# Patient Record
Sex: Female | Born: 1955 | Race: White | Hispanic: No | Marital: Married | Smoking: Never smoker
Health system: Southern US, Community
[De-identification: ages and names within clinical notes are randomized; demographics above are authoritative.]

## PROBLEM LIST (undated history)

## (undated) DIAGNOSIS — C569 Malignant neoplasm of unspecified ovary: Secondary | ICD-10-CM

## (undated) DIAGNOSIS — T7840XA Allergy, unspecified, initial encounter: Secondary | ICD-10-CM

## (undated) DIAGNOSIS — N390 Urinary tract infection, site not specified: Secondary | ICD-10-CM

## (undated) DIAGNOSIS — B019 Varicella without complication: Secondary | ICD-10-CM

## (undated) DIAGNOSIS — I10 Essential (primary) hypertension: Secondary | ICD-10-CM

## (undated) DIAGNOSIS — Z1371 Encounter for nonprocreative screening for genetic disease carrier status: Secondary | ICD-10-CM

## (undated) DIAGNOSIS — Z807 Family history of other malignant neoplasms of lymphoid, hematopoietic and related tissues: Secondary | ICD-10-CM

## (undated) HISTORY — PX: HERNIA REPAIR: SHX51

## (undated) HISTORY — DX: Essential (primary) hypertension: I10

## (undated) HISTORY — PX: ABDOMINAL HYSTERECTOMY: SHX81

## (undated) HISTORY — DX: Allergy, unspecified, initial encounter: T78.40XA

## (undated) HISTORY — DX: Malignant neoplasm of unspecified ovary: C56.9

## (undated) HISTORY — DX: Varicella without complication: B01.9

## (undated) HISTORY — PX: OTHER SURGICAL HISTORY: SHX169

## (undated) HISTORY — PX: COLONOSCOPY: SHX174

## (undated) HISTORY — DX: Urinary tract infection, site not specified: N39.0

## (undated) HISTORY — DX: Family history of other malignant neoplasms of lymphoid, hematopoietic and related tissues: Z80.7

---

## 1983-07-14 HISTORY — PX: WISDOM TOOTH EXTRACTION: SHX21

## 1989-07-13 HISTORY — PX: TONSILLECTOMY: SUR1361

## 1994-07-13 HISTORY — PX: DILATION AND CURETTAGE OF UTERUS: SHX78

## 2007-07-14 HISTORY — PX: KNEE ARTHROSCOPY: SUR90

## 2007-07-14 HISTORY — PX: APPENDECTOMY: SHX54

## 2011-09-11 DIAGNOSIS — C569 Malignant neoplasm of unspecified ovary: Secondary | ICD-10-CM

## 2011-09-11 HISTORY — PX: DEBULKING: SHX6277

## 2011-09-11 HISTORY — PX: TOTAL ABDOMINAL HYSTERECTOMY W/ BILATERAL SALPINGOOPHORECTOMY: SHX83

## 2011-09-11 HISTORY — DX: Malignant neoplasm of unspecified ovary: C56.9

## 2011-09-11 HISTORY — PX: ABDOMINAL HYSTERECTOMY: SHX81

## 2012-08-05 DIAGNOSIS — Z8249 Family history of ischemic heart disease and other diseases of the circulatory system: Secondary | ICD-10-CM | POA: Insufficient documentation

## 2012-08-31 ENCOUNTER — Ambulatory Visit: Payer: Self-pay | Admitting: Gynecologic Oncology

## 2012-09-21 ENCOUNTER — Encounter: Payer: Self-pay | Admitting: *Deleted

## 2012-09-22 ENCOUNTER — Encounter: Payer: Self-pay | Admitting: Gynecologic Oncology

## 2012-09-22 ENCOUNTER — Encounter: Payer: Self-pay | Admitting: *Deleted

## 2012-09-22 ENCOUNTER — Other Ambulatory Visit: Payer: Private Health Insurance - Indemnity | Admitting: Lab

## 2012-09-22 ENCOUNTER — Ambulatory Visit: Payer: Private Health Insurance - Indemnity | Attending: Gynecologic Oncology | Admitting: Gynecologic Oncology

## 2012-09-22 VITALS — BP 128/80 | HR 80 | Temp 98.2°F | Resp 20 | Ht 65.87 in | Wt 170.0 lb

## 2012-09-22 DIAGNOSIS — C569 Malignant neoplasm of unspecified ovary: Secondary | ICD-10-CM | POA: Insufficient documentation

## 2012-09-22 DIAGNOSIS — I10 Essential (primary) hypertension: Secondary | ICD-10-CM | POA: Insufficient documentation

## 2012-09-22 DIAGNOSIS — Z90710 Acquired absence of both cervix and uterus: Secondary | ICD-10-CM | POA: Insufficient documentation

## 2012-09-22 NOTE — Patient Instructions (Signed)
Return to see me in 3-4 months
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
## 2012-09-23 ENCOUNTER — Telehealth: Payer: Self-pay | Admitting: *Deleted

## 2012-09-23 NOTE — Telephone Encounter (Signed)
Pt informed of lab results, Ca 125 normal @ 6.0.

## 2012-10-12 ENCOUNTER — Other Ambulatory Visit: Payer: Self-pay | Admitting: *Deleted

## 2012-10-12 DIAGNOSIS — C569 Malignant neoplasm of unspecified ovary: Secondary | ICD-10-CM

## 2012-10-13 ENCOUNTER — Other Ambulatory Visit: Payer: Self-pay | Admitting: *Deleted

## 2012-10-13 DIAGNOSIS — C569 Malignant neoplasm of unspecified ovary: Secondary | ICD-10-CM

## 2012-10-18 ENCOUNTER — Telehealth: Payer: Self-pay | Admitting: Gynecologic Oncology

## 2012-10-20 ENCOUNTER — Encounter: Payer: Self-pay | Admitting: Obstetrics & Gynecology

## 2012-10-20 ENCOUNTER — Ambulatory Visit (INDEPENDENT_AMBULATORY_CARE_PROVIDER_SITE_OTHER): Payer: Private Health Insurance - Indemnity | Admitting: Obstetrics & Gynecology

## 2012-10-20 VITALS — BP 137/89 | HR 62 | Temp 97.8°F | Ht 66.5 in | Wt 176.0 lb

## 2012-10-20 DIAGNOSIS — Z01419 Encounter for gynecological examination (general) (routine) without abnormal findings: Secondary | ICD-10-CM

## 2012-10-20 DIAGNOSIS — N393 Stress incontinence (female) (male): Secondary | ICD-10-CM

## 2012-10-20 NOTE — Progress Notes (Signed)
Subjective:     Leah Perkins is a 57 y.o. female here for a routine exam.  Current complaints: no concerns at this time.  Personal health questionnaire reviewed: no.   Gynecologic History No LMP recorded. Patient has had a hysterectomy. Contraception: none Last Pap: 2013 Results were: normal Last mammogram: 06/2012. Results were: normal Bone scan: Oct. 2011      The following portions of the patient's history were reviewed and updated as appropriate: allergies, current medications, past family history, past medical history, past social history and past surgical history.  Review of Systems Genitourinary:positive for urinary incontinence    Objective:    General appearance: alert Breasts: normal appearance, no masses or tenderness Abdomen: soft, non-tender; bowel sounds normal; no masses,  no organomegaly Pelvic: external genitalia normal, no adnexal masses or tenderness, vagina normal without discharge    Assessment:    Healthy female exam.  Ovarian cancer in remission Stress incontinence--not socially embarassing   Plan:   Return prn

## 2012-10-20 NOTE — Patient Instructions (Signed)
Health Maintenance, Females A healthy lifestyle and preventative care can promote health and wellness.  Maintain regular health, dental, and eye exams.  Eat a healthy diet. Foods like vegetables, fruits, whole grains, low-fat dairy products, and lean protein foods contain the nutrients you need without too many calories. Decrease your intake of foods high in solid fats, added sugars, and salt. Get information about a proper diet from your caregiver, if necessary.  Regular physical exercise is one of the most important things you can do for your health. Most adults should get at least 150 minutes of moderate-intensity exercise (any activity that increases your heart rate and causes you to sweat) each week. In addition, most adults need muscle-strengthening exercises on 2 or more days a week.   Maintain a healthy weight. The body mass index (BMI) is a screening tool to identify possible weight problems. It provides an estimate of body fat based on height and weight. Your caregiver can help determine your BMI, and can help you achieve or maintain a healthy weight. For adults 20 years and older:  A BMI below 18.5 is considered underweight.  A BMI of 18.5 to 24.9 is normal.  A BMI of 25 to 29.9 is considered overweight.  A BMI of 30 and above is considered obese.  Maintain normal blood lipids and cholesterol by exercising and minimizing your intake of saturated fat. Eat a balanced diet with plenty of fruits and vegetables. Blood tests for lipids and cholesterol should begin at age 20 and be repeated every 5 years. If your lipid or cholesterol levels are high, you are over 50, or you are a high risk for heart disease, you may need your cholesterol levels checked more frequently.Ongoing high lipid and cholesterol levels should be treated with medicines if diet and exercise are not effective.  If you smoke, find out from your caregiver how to quit. If you do not use tobacco, do not start.  If you  are pregnant, do not drink alcohol. If you are breastfeeding, be very cautious about drinking alcohol. If you are not pregnant and choose to drink alcohol, do not exceed 1 drink per day. One drink is considered to be 12 ounces (355 mL) of beer, 5 ounces (148 mL) of wine, or 1.5 ounces (44 mL) of liquor.  Avoid use of street drugs. Do not share needles with anyone. Ask for help if you need support or instructions about stopping the use of drugs.  High blood pressure causes heart disease and increases the risk of stroke. Blood pressure should be checked at least every 1 to 2 years. Ongoing high blood pressure should be treated with medicines, if weight loss and exercise are not effective.  If you are 55 to 58 years old, ask your caregiver if you should take aspirin to prevent strokes.  Diabetes screening involves taking a blood sample to check your fasting blood sugar level. This should be done once every 3 years, after age 45, if you are within normal weight and without risk factors for diabetes. Testing should be considered at a younger age or be carried out more frequently if you are overweight and have at least 1 risk factor for diabetes.  Breast cancer screening is essential preventative care for women. You should practice "breast self-awareness." This means understanding the normal appearance and feel of your breasts and may include breast self-examination. Any changes detected, no matter how small, should be reported to a caregiver. Women in their 20s and 30s should have   a clinical breast exam (CBE) by a caregiver as part of a regular health exam every 1 to 3 years. After age 40, women should have a CBE every year. Starting at age 40, women should consider having a mammogram (breast X-ray) every year. Women who have a family history of breast cancer should talk to their caregiver about genetic screening. Women at a high risk of breast cancer should talk to their caregiver about having an MRI and a  mammogram every year.  The Pap test is a screening test for cervical cancer. Women should have a Pap test starting at age 21. Between ages 21 and 29, Pap tests should be repeated every 2 years. Beginning at age 30, you should have a Pap test every 3 years as long as the past 3 Pap tests have been normal. If you had a hysterectomy for a problem that was not cancer or a condition that could lead to cancer, then you no longer need Pap tests. If you are between ages 65 and 70, and you have had normal Pap tests going back 10 years, you no longer need Pap tests. If you have had past treatment for cervical cancer or a condition that could lead to cancer, you need Pap tests and screening for cancer for at least 20 years after your treatment. If Pap tests have been discontinued, risk factors (such as a new sexual partner) need to be reassessed to determine if screening should be resumed. Some women have medical problems that increase the chance of getting cervical cancer. In these cases, your caregiver may recommend more frequent screening and Pap tests.  The human papillomavirus (HPV) test is an additional test that may be used for cervical cancer screening. The HPV test looks for the virus that can cause the cell changes on the cervix. The cells collected during the Pap test can be tested for HPV. The HPV test could be used to screen women aged 30 years and older, and should be used in women of any age who have unclear Pap test results. After the age of 30, women should have HPV testing at the same frequency as a Pap test.  Colorectal cancer can be detected and often prevented. Most routine colorectal cancer screening begins at the age of 50 and continues through age 75. However, your caregiver may recommend screening at an earlier age if you have risk factors for colon cancer. On a yearly basis, your caregiver may provide home test kits to check for hidden blood in the stool. Use of a small camera at the end of a  tube, to directly examine the colon (sigmoidoscopy or colonoscopy), can detect the earliest forms of colorectal cancer. Talk to your caregiver about this at age 50, when routine screening begins. Direct examination of the colon should be repeated every 5 to 10 years through age 75, unless early forms of pre-cancerous polyps or small growths are found.  Hepatitis C blood testing is recommended for all people born from 1945 through 1965 and any individual with known risks for hepatitis C.  Practice safe sex. Use condoms and avoid high-risk sexual practices to reduce the spread of sexually transmitted infections (STIs). Sexually active women aged 25 and younger should be checked for Chlamydia, which is a common sexually transmitted infection. Older women with new or multiple partners should also be tested for Chlamydia. Testing for other STIs is recommended if you are sexually active and at increased risk.  Osteoporosis is a disease in which the   bones lose minerals and strength with aging. This can result in serious bone fractures. The risk of osteoporosis can be identified using a bone density scan. Women ages 12 and over and women at risk for fractures or osteoporosis should discuss screening with their caregivers. Ask your caregiver whether you should be taking a calcium supplement or vitamin D to reduce the rate of osteoporosis.  Menopause can be associated with physical symptoms and risks. Hormone replacement therapy is available to decrease symptoms and risks. You should talk to your caregiver about whether hormone replacement therapy is right for you.  Use sunscreen with a sun protection factor (SPF) of 30 or greater. Apply sunscreen liberally and repeatedly throughout the day. You should seek shade when your shadow is shorter than you. Protect yourself by wearing long sleeves, pants, a wide-brimmed hat, and sunglasses year round, whenever you are outdoors.  Notify your caregiver of new moles or  changes in moles, especially if there is a change in shape or color. Also notify your caregiver if a mole is larger than the size of a pencil eraser.  Stay current with your immunizations. Document Released: 01/12/2011 Document Revised: 09/21/2011 Document Reviewed: 01/12/2011 [facility] Patient Information 2013 [place], [place].  Urinary Incontinence Your doctor wants you to have this information about urinary incontinence. This is the inability to keep urine in your body until you decide to release it. CAUSES  Prostate gland enlargement is a common cause of urinary incontinence. But there are many different causes for losing urinary control. They include:  Medicines.  Infections.  Prostate problems.  Surgery.  Neurological diseases.  Emotional factors. DIAGNOSIS  Evaluating the cause of incontinence is important in choosing the best treatment. This may require:  An ultrasound exam.  Kidney and bladder X-rays.  Cystoscopy. This is an exam of the bladder using a narrow scope. TREATMENT  For incontinent patients, normal daily hygiene and using changing pads or adult diapers regularly will prevent offensive odors and skin damage from the moisture. Changing your medicines may help control incontinence. Your caregiver may prescribe some medicines to help you regain control. Avoid caffeine. It can over-stimulate the bladder. Use the bathroom regularly. Try about every 2 to 3 hours even if you do not feel the need. Take time to empty your bladder completely. After urinating, wait a minute. Then try to urinate again. External devices used to catch urine or an indwelling urine catheter (Foley catheter) may be needed as well. Some prostate gland problems require surgery to correct. Call your caregiver for more information. Document Released: 08/06/2004 Document Revised: 09/21/2011 Document Reviewed: 08/01/2008 [facility] Patient Information 2013 [place], [place].

## 2012-10-21 ENCOUNTER — Ambulatory Visit: Payer: Self-pay | Admitting: Obstetrics & Gynecology

## 2012-10-31 ENCOUNTER — Other Ambulatory Visit: Payer: Self-pay | Admitting: *Deleted

## 2012-10-31 ENCOUNTER — Ambulatory Visit: Payer: Private Health Insurance - Indemnity

## 2012-10-31 ENCOUNTER — Ambulatory Visit: Payer: Private Health Insurance - Indemnity | Admitting: Lab

## 2012-10-31 ENCOUNTER — Ambulatory Visit: Payer: Self-pay | Admitting: Obstetrics & Gynecology

## 2012-10-31 VITALS — BP 117/79 | HR 85 | Temp 98.0°F

## 2012-10-31 DIAGNOSIS — C569 Malignant neoplasm of unspecified ovary: Secondary | ICD-10-CM

## 2012-10-31 MED ORDER — SODIUM CHLORIDE 0.9 % IJ SOLN
10.0000 mL | INTRAMUSCULAR | Status: DC | PRN
  Administered 2012-10-31: 10 mL via INTRAVENOUS
  Filled 2012-10-31: qty 10

## 2012-10-31 MED ORDER — HEPARIN SOD (PORK) LOCK FLUSH 100 UNIT/ML IV SOLN
500.0000 [IU] | Freq: Once | INTRAVENOUS | Status: AC
  Administered 2012-10-31: 500 [IU] via INTRAVENOUS
  Filled 2012-10-31: qty 5

## 2012-11-11 ENCOUNTER — Telehealth: Payer: Self-pay | Admitting: *Deleted

## 2012-11-11 NOTE — Telephone Encounter (Signed)
Pt was unable to make June 2,2014 lab and flush appointment. Pt stated in a phone message that the week of May 19th would be  good to be rescheduled. A message was left detailing New flush and lab appointments. New appointment are May 23,2014 @ 9:15 and 9:30.

## 2012-11-11 NOTE — Telephone Encounter (Signed)
Pt called to verify new appts. Of  May 23,2014 @ 9:15 and 930 . Leah Perkins stated that May 23,2014 appointments will work for her

## 2012-12-02 ENCOUNTER — Ambulatory Visit: Payer: Private Health Insurance - Indemnity

## 2012-12-02 ENCOUNTER — Other Ambulatory Visit: Payer: Private Health Insurance - Indemnity | Admitting: Lab

## 2012-12-02 VITALS — BP 130/94 | HR 78 | Temp 97.0°F

## 2012-12-02 DIAGNOSIS — C569 Malignant neoplasm of unspecified ovary: Secondary | ICD-10-CM

## 2012-12-02 DIAGNOSIS — C801 Malignant (primary) neoplasm, unspecified: Secondary | ICD-10-CM

## 2012-12-02 LAB — CA 125: CA 125: 4.2 U/mL (ref 0.0–30.2)

## 2012-12-02 MED ORDER — SODIUM CHLORIDE 0.9 % IJ SOLN
10.0000 mL | INTRAMUSCULAR | Status: DC | PRN
  Administered 2012-12-02: 10 mL via INTRAVENOUS
  Filled 2012-12-02: qty 10

## 2012-12-02 MED ORDER — HEPARIN SOD (PORK) LOCK FLUSH 100 UNIT/ML IV SOLN
500.0000 [IU] | Freq: Once | INTRAVENOUS | Status: AC
  Administered 2012-12-02: 500 [IU] via INTRAVENOUS
  Filled 2012-12-02: qty 5

## 2012-12-02 NOTE — Progress Notes (Signed)
Ok to access and flush port today per Seward Grater, Consulting civil engineer.

## 2012-12-12 ENCOUNTER — Other Ambulatory Visit: Payer: Private Health Insurance - Indemnity | Admitting: Lab

## 2012-12-22 ENCOUNTER — Encounter: Payer: Self-pay | Admitting: Gynecologic Oncology

## 2012-12-22 ENCOUNTER — Ambulatory Visit: Payer: Private Health Insurance - Indemnity | Attending: Gynecologic Oncology | Admitting: Gynecologic Oncology

## 2012-12-22 VITALS — BP 102/68 | HR 68 | Temp 98.7°F | Resp 16 | Ht 65.0 in | Wt 177.0 lb

## 2012-12-22 DIAGNOSIS — M549 Dorsalgia, unspecified: Secondary | ICD-10-CM | POA: Insufficient documentation

## 2012-12-22 DIAGNOSIS — Z8543 Personal history of malignant neoplasm of ovary: Secondary | ICD-10-CM

## 2012-12-22 DIAGNOSIS — Z79899 Other long term (current) drug therapy: Secondary | ICD-10-CM | POA: Insufficient documentation

## 2012-12-22 DIAGNOSIS — C569 Malignant neoplasm of unspecified ovary: Secondary | ICD-10-CM | POA: Insufficient documentation

## 2012-12-22 DIAGNOSIS — I10 Essential (primary) hypertension: Secondary | ICD-10-CM | POA: Insufficient documentation

## 2012-12-22 DIAGNOSIS — Z9089 Acquired absence of other organs: Secondary | ICD-10-CM | POA: Insufficient documentation

## 2012-12-22 DIAGNOSIS — Z90710 Acquired absence of both cervix and uterus: Secondary | ICD-10-CM | POA: Insufficient documentation

## 2012-12-22 DIAGNOSIS — N393 Stress incontinence (female) (male): Secondary | ICD-10-CM | POA: Insufficient documentation

## 2012-12-22 DIAGNOSIS — Z9221 Personal history of antineoplastic chemotherapy: Secondary | ICD-10-CM | POA: Insufficient documentation
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
## 2013-01-02 ENCOUNTER — Ambulatory Visit: Payer: Private Health Insurance - Indemnity

## 2013-01-02 VITALS — BP 131/90 | HR 73 | Temp 97.5°F

## 2013-01-02 DIAGNOSIS — C569 Malignant neoplasm of unspecified ovary: Secondary | ICD-10-CM

## 2013-01-02 MED ORDER — SODIUM CHLORIDE 0.9 % IJ SOLN
10.0000 mL | INTRAMUSCULAR | Status: DC | PRN
  Administered 2013-01-02: 10 mL via INTRAVENOUS
  Filled 2013-01-02: qty 10

## 2013-01-02 MED ORDER — HEPARIN SOD (PORK) LOCK FLUSH 100 UNIT/ML IV SOLN
500.0000 [IU] | Freq: Once | INTRAVENOUS | Status: AC
  Administered 2013-01-02: 500 [IU] via INTRAVENOUS
  Filled 2013-01-02: qty 5

## 2013-01-02 NOTE — Patient Instructions (Addendum)
Call MD for problems 

## 2013-01-30 ENCOUNTER — Ambulatory Visit: Payer: Private Health Insurance - Indemnity

## 2013-01-30 VITALS — BP 130/90 | HR 63 | Temp 97.1°F | Resp 14

## 2013-01-30 DIAGNOSIS — C569 Malignant neoplasm of unspecified ovary: Secondary | ICD-10-CM

## 2013-01-30 MED ORDER — SODIUM CHLORIDE 0.9 % IJ SOLN
10.0000 mL | INTRAMUSCULAR | Status: DC | PRN
  Administered 2013-01-30: 10 mL via INTRAVENOUS
  Filled 2013-01-30: qty 10

## 2013-01-30 MED ORDER — HEPARIN SOD (PORK) LOCK FLUSH 100 UNIT/ML IV SOLN
500.0000 [IU] | Freq: Once | INTRAVENOUS | Status: AC
  Administered 2013-01-30: 500 [IU] via INTRAVENOUS
  Filled 2013-01-30: qty 5

## 2013-02-27 ENCOUNTER — Ambulatory Visit: Payer: Private Health Insurance - Indemnity

## 2013-02-27 VITALS — BP 152/98 | HR 74 | Temp 97.0°F

## 2013-02-27 DIAGNOSIS — C569 Malignant neoplasm of unspecified ovary: Secondary | ICD-10-CM

## 2013-02-27 MED ORDER — HEPARIN SOD (PORK) LOCK FLUSH 100 UNIT/ML IV SOLN
500.0000 [IU] | Freq: Once | INTRAVENOUS | Status: AC
  Administered 2013-02-27: 500 [IU] via INTRAVENOUS
  Filled 2013-02-27: qty 5

## 2013-02-27 MED ORDER — SODIUM CHLORIDE 0.9 % IJ SOLN
10.0000 mL | INTRAMUSCULAR | Status: DC | PRN
  Administered 2013-02-27: 10 mL via INTRAVENOUS
  Filled 2013-02-27: qty 10

## 2013-02-27 NOTE — Patient Instructions (Signed)
Implanted Port Instructions  An implanted port is a central line that has a round shape and is placed under the skin. It is used for long-term IV (intravenous) access for:  · Medicine.  · Fluids.  · Liquid nutrition, such as TPN (total parenteral nutrition).  · Blood samples.  Ports can be placed:  · In the chest area just below the collarbone (this is the most common place.)  · In the arms.  · In the belly (abdomen) area.  · In the legs.  PARTS OF THE PORT  A port has 2 main parts:  · The reservoir. The reservoir is round, disc-shaped, and will be a small, raised area under your skin.  · The reservoir is the part where a needle is inserted (accessed) to either give medicines or to draw blood.  · The catheter. The catheter is a long, slender tube that extends from the reservoir. The catheter is placed into a large vein.  · Medicine that is inserted into the reservoir goes into the catheter and then into the vein.  INSERTION OF THE PORT  · The port is surgically placed in either an operating room or in a procedural area (interventional radiology).  · Medicine may be given to help you relax during the procedure.  · The skin where the port will be inserted is numbed (local anesthetic).  · 1 or 2 small cuts (incisions) will be made in the skin to insert the port.  · The port can be used after it has been inserted.  INCISION SITE CARE  · The incision site may have small adhesive strips on it. This helps keep the incision site closed. Sometimes, no adhesive strips are placed. Instead of adhesive strips, a special kind of surgical glue is used to keep the incision closed.  · If adhesive strips were placed on the incision sites, do not take them off. They will fall off on their own.  · The incision site may be sore for 1 to 2 days. Pain medicine can help.  · Do not get the incision site wet. Bathe or shower as directed by your caregiver.  · The incision site should heal in 5 to 7 days. A small scar may form after the  incision has healed.  ACCESSING THE PORT  Special steps must be taken to access the port:  · Before the port is accessed, a numbing cream can be placed on the skin. This helps numb the skin over the port site.  · A sterile technique is used to access the port.  · The port is accessed with a needle. Only "non-coring" port needles should be used to access the port. Once the port is accessed, a blood return should be checked. This helps ensure the port is in the vein and is not clogged (clotted).  · If your caregiver believes your port should remain accessed, a clear (transparent) bandage will be placed over the needle site. The bandage and needle will need to be changed every week or as directed by your caregiver.  · Keep the bandage covering the needle clean and dry. Do not get it wet. Follow your caregiver's instructions on how to take a shower or bath when the port is accessed.  · If your port does not need to stay accessed, no bandage is needed over the port.  FLUSHING THE PORT  Flushing the port keeps it from getting clogged. How often the port is flushed depends on:  · If a   constant infusion is running. If a constant infusion is running, the port may not need to be flushed.  · If intermittent medicines are given.  · If the port is not being used.  For intermittent medicines:  · The port will need to be flushed:  · After medicines have been given.  · After blood has been drawn.  · As part of routine maintenance.  · A port is normally flushed with:  · Normal saline.  · Heparin.  · Follow your caregiver's advice on how often, how much, and the type of flush to use on your port.  IMPORTANT PORT INFORMATION  · Tell your caregiver if you are allergic to heparin.  · After your port is placed, you will get a manufacturer's information card. The card has information about your port. Keep this card with you at all times.  · There are many types of ports available. Know what kind of port you have.  · In case of an  emergency, it may be helpful to wear a medical alert bracelet. This can help alert health care workers that you have a port.  · The port can stay in for as long as your caregiver believes it is necessary.  · When it is time for the port to come out, surgery will be done to remove it. The surgery will be similar to how the port was put in.  · If you are in the hospital or clinic:  · Your port will be taken care of and flushed by a nurse.  · If you are at home:  · A home health care nurse may give medicines and take care of the port.  · You or a family member can get special training and directions for giving medicine and taking care of the port at home.  SEEK IMMEDIATE MEDICAL CARE IF:   · Your port does not flush or you are unable to get a blood return.  · New drainage or pus is coming from the incision.  · A bad smell is coming from the incision site.  · You develop swelling or increased redness at the incision site.  · You develop increased swelling or pain at the port site.  · You develop swelling or pain in the surrounding skin near the port.  · You have an oral temperature above 102° F (38.9° C), not controlled by medicine.  MAKE SURE YOU:   · Understand these instructions.  · Will watch your condition.  · Will get help right away if you are not doing well or get worse.  Document Released: 06/29/2005 Document Revised: 09/21/2011 Document Reviewed: 09/20/2008  [employer]® Patient Information ©2014 [employer].

## 2013-03-28 ENCOUNTER — Encounter: Payer: Self-pay | Admitting: Gynecologic Oncology

## 2013-03-28 ENCOUNTER — Ambulatory Visit: Payer: Private Health Insurance - Indemnity

## 2013-03-28 ENCOUNTER — Ambulatory Visit: Payer: Private Health Insurance - Indemnity | Admitting: Gynecologic Oncology

## 2013-03-28 ENCOUNTER — Other Ambulatory Visit: Payer: Private Health Insurance - Indemnity | Admitting: Lab

## 2013-03-28 VITALS — BP 138/90 | HR 80 | Temp 98.4°F | Resp 20 | Ht 65.0 in | Wt 182.9 lb

## 2013-03-28 DIAGNOSIS — C569 Malignant neoplasm of unspecified ovary: Secondary | ICD-10-CM

## 2013-03-28 MED ORDER — SODIUM CHLORIDE 0.9 % IJ SOLN
10.0000 mL | INTRAMUSCULAR | Status: DC | PRN
  Administered 2013-03-28: 10 mL via INTRAVENOUS
  Filled 2013-03-28: qty 10

## 2013-03-28 MED ORDER — HEPARIN SOD (PORK) LOCK FLUSH 100 UNIT/ML IV SOLN
500.0000 [IU] | Freq: Once | INTRAVENOUS | Status: AC
  Administered 2013-03-28: 500 [IU] via INTRAVENOUS
  Filled 2013-03-28: qty 5

## 2013-03-28 NOTE — Patient Instructions (Signed)
Return to clinic in 3 months

## 2013-03-28 NOTE — Progress Notes (Signed)
Consult Note: Gyn-Onc  Leah Perkins 57 y.o. female  CC:  Chief Complaint  Patient presents with  . 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 At that time she underwent optimal debulking and was treated with 6 cycles of dose dense paclitaxel and carboplatin. She completed her therapy in September 2013. CA 125 initially was 99 and prior to her move from [place] needed at 3. She had a CA 125 performed at the [facility] last month and it was13.6 which was higher than her baseline had been. She had also undergone genetic testing and is BRCA negative. Her gynecologic oncologist in [place] referred her to [place] for followup. He did discuss with her that the recurrence of a woman with stage II cancer would be approximately 30-40% and a discuss options regarding consolidation but opted for close surveillance. She did very well from her chemotherapy and had no significant issues had manageable neutropenia and no significant neuropathy in her performance status was 0. She also has a family history significant for hypertrophic cardiomyopathy she has been tested and does not have the gene. She comes in today to establish care for followup with [place].  She had a mammogram in December 13 that was negative. Should a colonoscopy in September 2009 that was negative. Her last bone density scan was in October 2011.  Interval History:  CA 125 may 23rd is 4.2. Today's values are pending.  Review of Systems:  Constitutional: She has gained some weight since we last saw her she is somewhat discouraged by this. She you have been walking about 3-4 miles a day but she did stop over the last 2 weeks due to other issues. Skin: No rash, sores, jaundice, itching, or dryness.  Cardiovascular: No chest pain, shortness of breath, or edema  Pulmonary: No cough or wheeze.  Gastro Intestinal:  No  nausea, vomiting, constipation, or diarrhea reported. No bright red blood per rectum or change in bowel movement. She's had a few episodes of which she describes as early satiety. It sounds like occasionally she feels a "stuck" in her throat. It is intermittent. It is been on for the last 4 weeks it has not gotten progressively worse. There's no nausea or vomiting associated with these episodes. Genitourinary: No frequency, urgency, or dysuria.  Denies vaginal bleeding and discharge.  Musculoskeletal: No myalgia, arthralgia, joint swelling or pain.  Neurologic: No weakness, numbness, or change in gait.  Psychology: No changes  Current Meds:  Outpatient Encounter Prescriptions as of 03/28/2013  Medication Sig Dispense Refill  . calcium carbonate (OS-CAL) 600 MG TABS Take 600 mg by mouth 2 (two) times daily with a meal. With vitamin d      . lisinopril (PRINIVIL,ZESTRIL) 20 MG tablet Take 20 mg by mouth daily.      . Multiple Vitamins-Minerals (MULTIVITAMIN WITH MINERALS) tablet Take 1 tablet by mouth daily.      . vitamin C (ASCORBIC ACID) 500 MG tablet Take 500 mg by mouth 2 (two) times daily.       No facility-administered encounter medications on file as of 03/28/2013.    Allergy:  Allergies  Allergen Reactions  . Sulfa Antibiotics     Social Hx:   History   Social History  . Marital Status: Married    Spouse Name: N/A    Number of  Children: N/A  . Years of Education: N/A   Occupational History  . Not on file.   Social History Main Topics  . Smoking status: Never Smoker   . Smokeless tobacco: Never Used  . Alcohol Use: Yes     Comment: Socially  . Drug Use: No  . Sexual Activity: Yes   Other Topics Concern  . Not on file   Social History Narrative  . No narrative on file    Past Surgical Hx:  Past Surgical History  Procedure Laterality Date  . Appendectomy    . Abdominal hysterectomy  3/13 [facility] optimal tumor debulking  . Arthoscopy Right     Knee    Past  Medical Hx:  Past Medical History  Diagnosis Date  . Hypertension   . Ovarian cancer 09/2011    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 9/13    Family Hx:  Family History  Problem Relation Age of Onset  . Hypertension Mother   . Hypertrophic cardiomyopathy Mother     pt negativ rofr gene 08/05/12  . Alzheimer's disease Father   . Cancer Brother   . Alzheimer's disease Paternal Grandmother   . Hypertrophic cardiomyopathy Son     Vitals:  Blood pressure 138/90, pulse 80, temperature 98.4 F (36.9 C), temperature source Oral, resp. rate 20, height 5\' 5"  (1.651 m), weight 182 lb 14.4 oz (82.963 kg).  Physical Exam: Well-nourished well-developed female in no acute distress.  Neck: Supple, no lymphadenopathy no thyromegaly.  Lungs: Clear to auscultation bilaterally.  Cardiac Astro: Regular rate rhythm.  Abdomen: Shows well-healed vertical midline incision. There is no evidence of an incisional hernia. Abdomen is soft, nontender, nondistended. There is no palpable mass or prostatomegaly.  Groins: There's no lymphadenopathy.  Extremities: There is no edema.  Pelvic: Normal external female genitalia. Bimanual examination reveals no masses or nodularity rectal confirms.  Assessment/Plan:  57 year old with stage IIc serous ovarian carcinoma diagnosed and treated in 2013. She was initially diagnosed in March of 2013 he completed her therapy in September of 2013 without significant sequela. CA 125 is informative for her as it was elevated at the time of her diagnosis. She had genetic testing and is negative for BRCA gene mutation. She also had genetic testing for hypertrophic cardiomyopathy and that was similarly negative. She return to see me in 3 months. We will followup in results of her CA 125 from today. Her port was flushed without issue today. She would like to have her Port-A-Cath removed and  we will schedule that for her. The procedure note from her port  placement in [place] is in the media aspect of her at the chart under procedures for March of 2013  [facility] MD 03/28/2013, 2:37 PM

## 2013-03-29 ENCOUNTER — Other Ambulatory Visit: Payer: Private Health Insurance - Indemnity | Admitting: Lab

## 2013-03-29 ENCOUNTER — Ambulatory Visit: Payer: Private Health Insurance - Indemnity | Admitting: Gynecologic Oncology

## 2013-03-29 ENCOUNTER — Telehealth: Payer: Self-pay | Admitting: Gynecologic Oncology

## 2013-03-29 ENCOUNTER — Other Ambulatory Visit: Payer: Self-pay | Admitting: Radiology

## 2013-03-29 NOTE — Telephone Encounter (Signed)
Patient informed of CA 125 results and that she would be contacted by [facility] to schedule port removal.  No concerns voiced.  Instructed to call for any needs.

## 2013-03-30 ENCOUNTER — Encounter (HOSPITAL_COMMUNITY): Payer: Self-pay | Admitting: Pharmacy Technician

## 2013-04-01 ENCOUNTER — Encounter: Payer: Self-pay | Admitting: Gynecologic Oncology

## 2013-04-03 ENCOUNTER — Other Ambulatory Visit: Payer: Self-pay | Admitting: Radiology

## 2013-04-03 ENCOUNTER — Encounter (HOSPITAL_COMMUNITY): Payer: Self-pay

## 2013-04-03 ENCOUNTER — Ambulatory Visit (HOSPITAL_COMMUNITY)
Admission: RE | Admit: 2013-04-03 | Discharge: 2013-04-03 | Disposition: A | Discharge status: Home / Self Care | Payer: Medicare HMO | Source: Ambulatory Visit | Attending: Gynecologic Oncology | Admitting: Gynecologic Oncology

## 2013-04-03 DIAGNOSIS — Z452 Encounter for adjustment and management of vascular access device: Secondary | ICD-10-CM | POA: Insufficient documentation

## 2013-04-03 DIAGNOSIS — C569 Malignant neoplasm of unspecified ovary: Secondary | ICD-10-CM

## 2013-04-03 LAB — CBC
HCT: 36.3 % (ref 36.0–46.0)
Hemoglobin: 12.5 g/dL (ref 12.0–15.0)
MCH: 29.8 pg (ref 26.0–34.0)
MCHC: 34.4 g/dL (ref 30.0–36.0)
RBC: 4.2 MIL/uL (ref 3.87–5.11)
WBC: 3.8 10*3/uL — ABNORMAL LOW (ref 4.0–10.5)

## 2013-04-03 LAB — PROTIME-INR
INR: 1.04 (ref 0.00–1.49)
Prothrombin Time: 13.4 seconds (ref 11.6–15.2)

## 2013-04-03 MED ORDER — SODIUM CHLORIDE 0.9 % IV SOLN: INTRAVENOUS | Status: DC

## 2013-04-03 MED ORDER — SODIUM CHLORIDE 0.9 % IV SOLN
INTRAVENOUS | Status: DC
  Administered 2013-04-03: 13:00:00 via INTRAVENOUS

## 2013-04-03 MED ORDER — CEFAZOLIN SODIUM-DEXTROSE 2-3 GM-% IV SOLR
INTRAVENOUS | Status: AC
  Administered 2013-04-03: 2 g via INTRAVENOUS
  Filled 2013-04-03: qty 50

## 2013-04-03 MED ORDER — LIDOCAINE HCL 1 % IJ SOLN
INTRAMUSCULAR | Status: AC
  Filled 2013-04-03: qty 20

## 2013-04-03 MED ORDER — CEFAZOLIN SODIUM-DEXTROSE 2-3 GM-% IV SOLR
2.0000 g | INTRAVENOUS | Status: AC
  Administered 2013-04-03: 2 g via INTRAVENOUS

## 2013-04-03 MED ORDER — CEFAZOLIN SODIUM-DEXTROSE 2-3 GM-% IV SOLR: 2.0000 g | INTRAVENOUS | Status: DC

## 2013-04-03 NOTE — Procedures (Signed)
RIJV PAC removal No comp 

## 2013-04-13 ENCOUNTER — Telehealth: Payer: Self-pay | Admitting: *Deleted

## 2013-04-13 NOTE — Telephone Encounter (Signed)
Pt called and cancel her flush for 04/24/13 due to her port was removed....td

## 2013-05-18 ENCOUNTER — Other Ambulatory Visit: Payer: Self-pay

## 2013-06-20 ENCOUNTER — Encounter: Payer: Self-pay | Admitting: Gynecologic Oncology

## 2013-06-20 ENCOUNTER — Ambulatory Visit: Payer: Private Health Insurance - Indemnity | Admitting: Lab

## 2013-06-20 ENCOUNTER — Ambulatory Visit: Payer: Medicare HMO | Attending: Gynecologic Oncology | Admitting: Gynecologic Oncology

## 2013-06-20 ENCOUNTER — Other Ambulatory Visit: Payer: Self-pay | Admitting: Gynecologic Oncology

## 2013-06-20 VITALS — BP 138/97 | HR 98 | Temp 98.0°F | Resp 20 | Ht 65.0 in | Wt 185.6 lb

## 2013-06-20 DIAGNOSIS — Z9221 Personal history of antineoplastic chemotherapy: Secondary | ICD-10-CM | POA: Insufficient documentation

## 2013-06-20 DIAGNOSIS — Z1231 Encounter for screening mammogram for malignant neoplasm of breast: Secondary | ICD-10-CM

## 2013-06-20 DIAGNOSIS — C569 Malignant neoplasm of unspecified ovary: Secondary | ICD-10-CM | POA: Insufficient documentation

## 2013-06-20 NOTE — Progress Notes (Signed)
Consult Note: Gyn-Onc  Karl Luke 57 y.o. female  CC:  Chief Complaint  Patient presents with  . 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 At that time she underwent optimal debulking and was treated with 6 cycles of dose dense paclitaxel and carboplatin. She completed her therapy in September 2013. CA 125 initially was 99 and prior to her move from [place] needed at 3. She had a CA 125 performed at the [facility] last month and it was13.6 which was higher than her baseline had been. She had also undergone genetic testing and is BRCA negative. Her gynecologic oncologist in [place] referred her to [place] for followup. He did discuss with her that the recurrence of a woman with stage II cancer would be approximately 30-40% and a discuss options regarding consolidation but opted for close surveillance. She did very well from her chemotherapy and had no significant issues had manageable neutropenia and no significant neuropathy in her performance status was 0. She also has a family history significant for hypertrophic cardiomyopathy she has been tested and does not have the gene. She comes in today to establish care for followup with [place].  She had a mammogram in December 13 that was negative. She has not had one since she moved from [place]. She would like to have that scheduled. She had a colonoscopy in September 2009 that was negative. Her last bone density scan was in October 2011. I last saw her in September 2014 at which time her CA-125 was 4.9.  Interval History:  She herself is been doing very well. Her husband has been ill which has caused her for the past few weeks to limit her walking. They were walking and he was complaining of what he described as left knee pain. Ultimately he developed some shortness of breath and went to see his pulmonologist who diagnosed him  with pulmonary embolism. He also had a lower extremity DVT and is currently on Xarelto. He is doing much better. She had her Port-A-Cath removed on September 15. She does has a little bit of MG type of pain in the area of the port. She longer feels that food is getting stuck in her throat. The early satiety she commented to me about in September has resolved.  Review of Systems:  Constitutional: She has gained some weight since we last saw her she is somewhat discouraged by this. She you have been walking about 3-4 miles a day but she did stop over the last 3 weeks as mentioned above Skin: No rash, sores, jaundice, itching, or dryness.  Cardiovascular: No chest pain, shortness of breath, or edema  Pulmonary: No cough or wheeze.  Gastro Intestinal:  No nausea, vomiting, constipation, or diarrhea reported. No bright red blood per rectum or change in bowel movement.  Genitourinary: No frequency, urgency, or dysuria.  Denies vaginal bleeding and discharge.  Musculoskeletal: No myalgia, arthralgia, joint swelling or pain.  Neurologic: No weakness, numbness, or change in gait.  Psychology: No changes  Current Meds:  Outpatient Encounter Prescriptions as of 06/20/2013  Medication Sig  . Calcium Carbonate-Vitamin D (CALCIUM 600 + D PO) Take 1 tablet by mouth 2 (two) times daily.  Marland Kitchen lisinopril (PRINIVIL,ZESTRIL) 20 MG tablet Take 20 mg by mouth every morning.   . Multiple Vitamins-Minerals (MULTIVITAMIN WITH MINERALS) tablet  Take 1 tablet by mouth daily.  . vitamin C (ASCORBIC ACID) 500 MG tablet Take 500 mg by mouth 2 (two) times daily.    Allergy:  Allergies  Allergen Reactions  . Sulfa Antibiotics     Social Hx:   History   Social History  . Marital Status: Married    Spouse Name: N/A    Number of Children: N/A  . Years of Education: N/A   Occupational History  . Not on file.   Social History Main Topics  . Smoking status: Never Smoker   . Smokeless tobacco: Never Used  . Alcohol  Use: Yes     Comment: Socially  . Drug Use: No  . Sexual Activity: Yes   Other Topics Concern  . Not on file   Social History Narrative  . No narrative on file    Past Surgical Hx:  Past Surgical History  Procedure Laterality Date  . Appendectomy    . Abdominal hysterectomy  3/13 [facility] optimal tumor debulking  . Arthoscopy Right     Knee    Past Medical Hx:  Past Medical History  Diagnosis Date  . Hypertension   . Ovarian cancer 09/2011    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 9/13    Family Hx:  Family History  Problem Relation Age of Onset  . Hypertension Mother   . Hypertrophic cardiomyopathy Mother     pt negativ rofr gene 08/05/12  . Alzheimer's disease Father   . Cancer Brother   . Alzheimer's disease Paternal Grandmother   . Hypertrophic cardiomyopathy Son     Vitals:  Blood pressure 138/97, pulse 98, temperature 98 F (36.7 C), temperature source Oral, resp. rate 20, height 5\' 5"  (1.651 m), weight 185 lb 9.6 oz (84.188 kg).  Physical Exam: Well-nourished well-developed female in no acute distress.  Neck: Supple, no lymphadenopathy no thyromegaly.  Lungs: Clear to auscultation bilaterally.  Cardiac Astro: Regular rate rhythm.  Abdomen: Shows well-healed vertical midline incision. There is no evidence of an incisional hernia. Abdomen is soft, nontender, nondistended. There is no palpable mass or hepatomsplenomegaly.  Groins: There's no lymphadenopathy.  Extremities: There is no edema.  Pelvic: Normal external female genitalia. Bimanual examination reveals no masses or nodularity rectal confirms.  Assessment/Plan:  57 year old with stage IIc serous ovarian carcinoma diagnosed and treated in 2013. She was initially diagnosed in March of 2013 and she completed her therapy in September of 2013 without significant sequela. CA 125 is informative for her as it was elevated at the time of her diagnosis. She had genetic testing and is  negative for BRCA gene mutation. She also had genetic testing for hypertrophic cardiomyopathy and that was similarly negative. She return to see me in 3 months.   We'll followup in results of her CA 125. We will notify her of the results.  I've ordered a mammogram for her. Kaoir Loree A., MD 06/20/2013, 1:47 PM

## 2013-06-20 NOTE — Patient Instructions (Addendum)
We'll notify you of the results of your CA 125. Please make sure to get her mammogram done. Please return to see me in 3 months.

## 2013-06-21 ENCOUNTER — Ambulatory Visit: Payer: Medicare HMO | Admitting: Gynecologic Oncology

## 2013-06-22 ENCOUNTER — Telehealth: Payer: Self-pay | Admitting: Gynecologic Oncology

## 2013-06-22 NOTE — Telephone Encounter (Signed)
Pt notified of CA 125 results.  No concerns voiced.  Instructed to call for any needs.

## 2013-07-11 ENCOUNTER — Ambulatory Visit (HOSPITAL_COMMUNITY): Payer: Medicare HMO

## 2013-07-25 ENCOUNTER — Ambulatory Visit (HOSPITAL_COMMUNITY)
Admission: RE | Admit: 2013-07-25 | Discharge: 2013-07-25 | Disposition: A | Discharge status: Home / Self Care | Payer: Medicare HMO | Source: Ambulatory Visit | Attending: Gynecologic Oncology | Admitting: Gynecologic Oncology

## 2013-07-25 DIAGNOSIS — Z1231 Encounter for screening mammogram for malignant neoplasm of breast: Secondary | ICD-10-CM | POA: Insufficient documentation

## 2013-09-27 ENCOUNTER — Ambulatory Visit: Payer: Private Health Insurance - Indemnity

## 2013-09-27 ENCOUNTER — Ambulatory Visit: Payer: Private Health Insurance - Indemnity | Attending: Gynecologic Oncology | Admitting: Gynecologic Oncology

## 2013-09-27 ENCOUNTER — Encounter: Payer: Self-pay | Admitting: Gynecologic Oncology

## 2013-09-27 VITALS — BP 145/95 | HR 73 | Temp 97.9°F | Resp 18 | Ht 65.0 in | Wt 184.4 lb

## 2013-09-27 DIAGNOSIS — C569 Malignant neoplasm of unspecified ovary: Secondary | ICD-10-CM | POA: Insufficient documentation

## 2013-09-27 DIAGNOSIS — Z1371 Encounter for nonprocreative screening for genetic disease carrier status: Secondary | ICD-10-CM | POA: Insufficient documentation

## 2013-09-27 DIAGNOSIS — I10 Essential (primary) hypertension: Secondary | ICD-10-CM | POA: Insufficient documentation

## 2013-09-27 NOTE — Patient Instructions (Signed)
Will notify you of the results of your CA 125. Return to clinic in 3 months

## 2013-09-27 NOTE — Progress Notes (Signed)
Consult Note: Gyn-Onc  Leah Perkins 58 y.o. female  CC:  Chief Complaint  Patient presents with  . 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 At that time she underwent optimal debulking and was treated with 6 cycles of dose dense paclitaxel and carboplatin. She completed her therapy in September 2013. CA 125 initially was 99 and prior to her move from [place] needed at 3. She had a CA 125 performed at the [facility] last month and it was13.6 which was higher than her baseline had been. She had also undergone genetic testing and is BRCA negative. Her gynecologic oncologist in [place] referred her to [place] for followup. He did discuss with her that the recurrence of a woman with stage II cancer would be approximately 30-40% and a discuss options regarding consolidation but opted for close surveillance. She did very well from her chemotherapy and had no significant issues had manageable neutropenia and no significant neuropathy in her performance status was 0. She also has a family history significant for hypertrophic cardiomyopathy she has been tested and does not have the gene. She comes in today for followup with [place].  She had a mammogram 07/25/13 that was unremarkable. She had a colonoscopy in September 2009 that was negative. Her last bone density scan was in October 2011. I last saw her in December 2014 at which time her CA-125 was 4.8.  Interval History:  She herself is been doing very well.  She had her Port-A-Cath removed on September 15. She is doing quite well his as is her husband. She's going to the gym about 5 days a week and goes for an hour to an hour and a half each time. She walks on the treadmill for 30-45 minutes and also is lifting weights. She alternates legs and upper body. She's been noticing a pain under her right arm since going to the gym as to off  about 2 weeks. She noticed it primarily in the shower. It really her to palpation and not with range of motion. She did have a mammogram that was normal in January. She's also receiving some tenderness in her Port-A-Cath that started about the same time as the tenderness under her right axilla.  Review of Systems:  Constitutional: She is feeling well and is a bit discouraged with her weight, her clothes are fitting differently. Skin: No rash, sores, jaundice, itching, or dryness.  Cardiovascular: No chest pain, shortness of breath, or edema  Pulmonary: No cough or wheeze.  Gastro Intestinal:  No nausea, vomiting, constipation, or diarrhea reported. No bright red blood per rectum or change in bowel movement.  Genitourinary: No frequency, urgency, or dysuria.  Denies vaginal bleeding and discharge.  Musculoskeletal: No myalgia, arthralgia, + pain under right arm Neurologic: No weakness, numbness, or change in gait.  Psychology: No changes  Current Meds:  Outpatient Encounter Prescriptions as of 09/27/2013  Medication Sig  . Calcium Carbonate-Vitamin D (CALCIUM 600 + D PO) Take 1 tablet by mouth 2 (two) times daily.  . cholecalciferol (VITAMIN D) 1000 UNITS tablet Take 1,000 Units by mouth daily.  Marland Kitchen lisinopril (PRINIVIL,ZESTRIL) 20 MG tablet Take 20 mg by mouth every morning.   . Multiple Vitamins-Minerals (MULTIVITAMIN WITH MINERALS) tablet Take 1 tablet by mouth daily.  . vitamin C (ASCORBIC ACID) 500 MG tablet Take 500  mg by mouth 2 (two) times daily.    Allergy:  Allergies  Allergen Reactions  . Sulfa Antibiotics     "does not remember"    Social Hx:   History   Social History  . Marital Status: Married    Spouse Name: N/A    Number of Children: N/A  . Years of Education: N/A   Occupational History  . Not on file.   Social History Main Topics  . Smoking status: Never Smoker   . Smokeless tobacco: Never Used  . Alcohol Use: Yes     Comment: Socially  . Drug Use: No  .  Sexual Activity: Yes   Other Topics Concern  . Not on file   Social History Narrative  . No narrative on file    Past Surgical Hx:  Past Surgical History  Procedure Laterality Date  . Appendectomy    . Abdominal hysterectomy  3/13 [facility] optimal tumor debulking  . Arthoscopy Right     Knee    Past Medical Hx:  Past Medical History  Diagnosis Date  . Hypertension   . Ovarian cancer 09/2011    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 9/13    Family Hx:  Family History  Problem Relation Age of Onset  . Hypertension Mother   . Hypertrophic cardiomyopathy Mother     pt negativ rofr gene 08/05/12  . Alzheimer's disease Father   . Cancer Brother   . Alzheimer's disease Paternal Grandmother   . Hypertrophic cardiomyopathy Son     Vitals:  Blood pressure 145/95, pulse 73, temperature 97.9 F (36.6 C), resp. rate 18, height _0  (1.651 m), weight 184 lb 6.4 oz (83.643 kg).  Physical Exam: Well-nourished well-developed female in no acute distress.  Neck: Supple, no lymphadenopathy no thyromegaly.  Lungs: Clear to auscultation bilaterally. Tender over right pect. Major, no palpable masses  Cardiac: Regular rate rhythm.  Abdomen: Shows well-healed vertical midline incision. There is no evidence of an incisional hernia. Abdomen is soft, nontender, nondistended. There is no palpable mass or hepatomsplenomegaly.  Groins: There's no lymphadenopathy.  Extremities: There is no edema.  Pelvic: Normal external female genitalia. Bimanual examination reveals no masses or nodularity rectal confirms.  Assessment/Plan:  58 year old with stage IIc serous ovarian carcinoma diagnosed and treated in 2013. She was initially diagnosed in March of 2013 and she completed her therapy in September of 2013 without significant sequela. CA 125 is informative for her as it was elevated at the time of her diagnosis. She had genetic testing and is negative for BRCA gene mutation.  She also had genetic testing for hypertrophic cardiomyopathy and that was similarly negative. She return to see me in 3 months.   We'll followup in results of her CA 125. We will notify her of the results.  Jaylise Peek A., MD 09/27/2013, 9:37 AM

## 2013-09-28 ENCOUNTER — Telehealth: Payer: Self-pay | Admitting: Gynecologic Oncology

## 2013-09-28 LAB — CA 125: CA 125: 2.6 U/mL (ref 0.0–30.2)

## 2013-09-28 NOTE — Telephone Encounter (Signed)
Patient informed of CA 125 results.  No concerns voiced.  Advised to call for any concerns.

## 2013-11-01 ENCOUNTER — Ambulatory Visit: Payer: Private Health Insurance - Indemnity | Admitting: Obstetrics & Gynecology

## 2013-11-29 ENCOUNTER — Other Ambulatory Visit (HOSPITAL_BASED_OUTPATIENT_CLINIC_OR_DEPARTMENT_OTHER): Payer: Self-pay | Admitting: Family Medicine

## 2013-11-29 DIAGNOSIS — I700 Atherosclerosis of aorta: Secondary | ICD-10-CM

## 2013-12-01 ENCOUNTER — Ambulatory Visit (HOSPITAL_BASED_OUTPATIENT_CLINIC_OR_DEPARTMENT_OTHER)
Admission: RE | Admit: 2013-12-01 | Discharge: 2013-12-01 | Disposition: A | Discharge status: Home / Self Care | Payer: Private Health Insurance - Indemnity | Source: Ambulatory Visit | Attending: Family Medicine | Admitting: Family Medicine

## 2013-12-01 DIAGNOSIS — I700 Atherosclerosis of aorta: Secondary | ICD-10-CM | POA: Insufficient documentation

## 2013-12-28 ENCOUNTER — Encounter: Payer: Self-pay | Admitting: Gynecologic Oncology

## 2013-12-28 ENCOUNTER — Ambulatory Visit: Payer: Private Health Insurance - Indemnity | Attending: Gynecologic Oncology | Admitting: Gynecologic Oncology

## 2013-12-28 ENCOUNTER — Ambulatory Visit: Payer: Private Health Insurance - Indemnity

## 2013-12-28 VITALS — BP 128/86 | HR 81 | Temp 98.4°F | Resp 18 | Ht 65.0 in | Wt 181.8 lb

## 2013-12-28 DIAGNOSIS — Z8543 Personal history of malignant neoplasm of ovary: Secondary | ICD-10-CM | POA: Diagnosis not present

## 2013-12-28 DIAGNOSIS — I10 Essential (primary) hypertension: Secondary | ICD-10-CM | POA: Insufficient documentation

## 2013-12-28 DIAGNOSIS — Z9221 Personal history of antineoplastic chemotherapy: Secondary | ICD-10-CM

## 2013-12-28 DIAGNOSIS — I7090 Unspecified atherosclerosis: Secondary | ICD-10-CM

## 2013-12-28 DIAGNOSIS — C569 Malignant neoplasm of unspecified ovary: Secondary | ICD-10-CM | POA: Insufficient documentation

## 2013-12-28 DIAGNOSIS — Z79899 Other long term (current) drug therapy: Secondary | ICD-10-CM | POA: Insufficient documentation

## 2013-12-28 NOTE — Addendum Note (Signed)
Addended by: Lucile Crater on: 12/28/2013 01:33 PM   Modules accepted: Orders

## 2013-12-28 NOTE — Patient Instructions (Signed)
We will notify you of the results of your CA 125. Please return to see [place] in 3 months. At that time it'll be 2 years since completion of your treatment will change her visits to every 4 months for the subsequent year.

## 2013-12-28 NOTE — Progress Notes (Signed)
Consult Note: Gyn-Onc  Leah Perkins 58 y.o. female  CC:  Chief Complaint  Patient presents with  . 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 At that time she underwent optimal debulking and was treated with 6 cycles of dose dense paclitaxel and carboplatin. She completed her therapy in September 2013. CA 125 initially was 99 and prior to her move from [place] needed at 3. She had a CA 125 performed at the [facility] last month and it was13.6 which was higher than her baseline had been. She had also undergone genetic testing and is BRCA negative. Her gynecologic oncologist in [place] referred her to [place] for followup. He did discuss with her that the recurrence of a woman with stage II cancer would be approximately 30-40% and a discuss options regarding consolidation but opted for close surveillance. She did very well from her chemotherapy and had no significant issues had manageable neutropenia and no significant neuropathy in her performance status was 0. She also has a family history significant for hypertrophic cardiomyopathy she has been tested and does not have the gene. She comes in today for followup with [place].  She had a mammogram 07/25/13 that was unremarkable. She had a colonoscopy in September 2009 that was negative. Her last bone density scan was in October 2011. I last saw her in December 2014 at which time her CA-125 was 4.8.  Interval History:  She herself is been doing very well.  She had her Port-A-Cath removed. She's going to the gym about 5 days a week and goes for an hour to an hour and a half each time. She walks on the treadmill for 30-45 minutes and also is lifting weights. She alternates legs and upper body. She did have a mammogram that was normal in January. She continues to have some tenderness at her port site. Since I last saw her she's been feeling a lump  under her breast on that she primarily feels that she's sitting up when she lies down it is gone. She had ultrasound performed that revealed no abdominal hernia no other findings except for diffuse atherosclerotic disease. She experienced soreness in the area of the "lump" during the ultrasound and was sore for a few days after. Her mother had hypertension and cardiomegaly but did not have a myocardial infarction. Her weight is stable. She's exercising.  Review of Systems:  Constitutional: Denies fevers Skin: No rash, sores, jaundice, itching, or dryness.  Cardiovascular: No chest pain, shortness of breath, or edema  Pulmonary: No cough or wheeze.  Gastro Intestinal:  No nausea, vomiting, constipation, or diarrhea reported. No bright red blood per rectum or change in bowel movement.  Genitourinary: No frequency, urgency, or dysuria.  Denies vaginal bleeding and discharge.  Musculoskeletal: No myalgia, arthralgia Neurologic: No weakness, numbness, or change in gait.  Psychology: No changes  Current Meds:  Outpatient Encounter Prescriptions as of 12/28/2013  Medication Sig  . Calcium Carbonate-Vitamin D (CALCIUM 600 + D PO) Take 1 tablet by mouth 2 (two) times daily.  . cholecalciferol (VITAMIN D) 1000 UNITS tablet Take 1,000 Units by mouth daily.  Marland Kitchen lisinopril (PRINIVIL,ZESTRIL) 20 MG tablet Take 40 mg by mouth every morning.   . Multiple Vitamins-Minerals (MULTIVITAMIN WITH MINERALS) tablet Take 1 tablet by mouth daily.  . vitamin C (ASCORBIC ACID) 500 MG tablet Take 500 mg  by mouth 2 (two) times daily.    Allergy:  Allergies  Allergen Reactions  . Sulfa Antibiotics     "does not remember"    Social Hx:   History   Social History  . Marital Status: Married    Spouse Name: N/A    Number of Children: N/A  . Years of Education: N/A   Occupational History  . Not on file.   Social History Main Topics  . Smoking status: Never Smoker   . Smokeless tobacco: Never Used  . Alcohol  Use: Yes     Comment: Socially  . Drug Use: No  . Sexual Activity: Yes   Other Topics Concern  . Not on file   Social History Narrative  . No narrative on file    Past Surgical Hx:  Past Surgical History  Procedure Laterality Date  . Appendectomy    . Abdominal hysterectomy  3/13 [facility] optimal tumor debulking  . Arthoscopy Right     Knee    Past Medical Hx:  Past Medical History  Diagnosis Date  . Hypertension   . Ovarian cancer 09/2011    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 9/13    Family Hx:  Family History  Problem Relation Age of Onset  . Hypertension Mother   . Hypertrophic cardiomyopathy Mother     pt negativ rofr gene 08/05/12  . Alzheimer's disease Father   . Cancer Brother   . Alzheimer's disease Paternal Grandmother   . Hypertrophic cardiomyopathy Son     Vitals:  Blood pressure 128/86, pulse 81, temperature 98.4 F (36.9 C), temperature source Oral, resp. rate 18, height $RemoveBe'5\' 5"'[id number]$  (1.651 m), weight 181 lb 12.8 oz (82.464 kg).  Physical Exam: Well-nourished well-developed female in no acute distress.  Neck: Supple, no lymphadenopathy no thyromegaly.  Lungs: Clear to auscultation bilaterally. Tender over right pect. Major, no palpable masses  Cardiac: Regular rate rhythm.  Abdomen: Shows well-healed vertical midline incision. There is no evidence of an incisional hernia. Abdomen is soft, nontender, nondistended. There is no palpable mass or hepatomsplenomegaly. She was examined sitting and lying down. I cannot appreciate the mass that she is able to appreciate it is midway between her umbilicus in the epigastrium. There is no fluid wave.  Groins: There's no lymphadenopathy.  Extremities: There is no edema.  Pelvic: Normal external female genitalia. Bimanual examination reveals no masses or nodularity rectal confirms.  Assessment/Plan:  58 year old with stage IIc serous ovarian carcinoma diagnosed and treated in 2013. She was  initially diagnosed in March of 2013 and she completed her therapy in September of 2013 without significant sequela. CA 125 is informative for her as it was elevated at the time of her diagnosis. She had genetic testing and is negative for BRCA gene mutation. She also had genetic testing for hypertrophic cardiomyopathy and that was similarly negative. She return to see me in 3 months.   We'll followup in results of her CA 125. We will notify her of the results. At her next visit will be 2 years since she completed her treatment will change her appointment every 4 months. If her CA 125 is rising, or the abdominal discomfort mass it she perceives gets bigger we will schedule her for a CT scan. She will discuss the findings of atherosclerotic disease with her primary care physician. Leah Perkins,Leah A., MD 12/28/2013, 12:29 PM

## 2013-12-29 LAB — CA 125: CA 125: 4.8 U/mL (ref 0.0–30.2)

## 2014-01-02 ENCOUNTER — Telehealth: Payer: Self-pay | Admitting: *Deleted

## 2014-01-02 NOTE — Telephone Encounter (Signed)
Notified pt CA125 within normal limits. Pt verbalized understanding. No further concerns

## 2014-03-07 ENCOUNTER — Ambulatory Visit: Payer: Self-pay

## 2014-03-07 ENCOUNTER — Ambulatory Visit (INDEPENDENT_AMBULATORY_CARE_PROVIDER_SITE_OTHER): Payer: Private Health Insurance - Indemnity | Admitting: Family Medicine

## 2014-03-07 ENCOUNTER — Ambulatory Visit
Admission: RE | Admit: 2014-03-07 | Discharge: 2014-03-07 | Disposition: A | Discharge status: Home / Self Care | Payer: Private Health Insurance - Indemnity | Source: Ambulatory Visit | Attending: Family Medicine | Admitting: Family Medicine

## 2014-03-07 ENCOUNTER — Encounter: Payer: Self-pay | Admitting: Family Medicine

## 2014-03-07 ENCOUNTER — Other Ambulatory Visit: Payer: Self-pay | Admitting: Family Medicine

## 2014-03-07 VITALS — BP 130/82 | HR 88 | Ht 65.0 in | Wt 186.0 lb

## 2014-03-07 DIAGNOSIS — M25511 Pain in right shoulder: Secondary | ICD-10-CM

## 2014-03-07 DIAGNOSIS — M949 Disorder of cartilage, unspecified: Secondary | ICD-10-CM

## 2014-03-07 DIAGNOSIS — R52 Pain, unspecified: Secondary | ICD-10-CM

## 2014-03-07 DIAGNOSIS — S4360XA Sprain of unspecified sternoclavicular joint, initial encounter: Secondary | ICD-10-CM

## 2014-03-07 DIAGNOSIS — S29011A Strain of muscle and tendon of front wall of thorax, initial encounter: Secondary | ICD-10-CM

## 2014-03-07 DIAGNOSIS — M899 Disorder of bone, unspecified: Secondary | ICD-10-CM

## 2014-03-07 HISTORY — DX: Strain of muscle and tendon of front wall of thorax, initial encounter: S29.011A

## 2014-03-07 NOTE — Assessment & Plan Note (Addendum)
Complete patient has had a chronic sternoclavicular strain in with recent increase in activity she seem to aggravate it. Patient is not having any significant pain this has more of an audible popping. I believe that this is just the tendon rolling over that area. Patient does have a flattened l fatty tissue and does not appear to be a lymph node. This is overlying the bone and likely not a supraclavicular node. I do not feel any significant swelling and no hard masses appreciated. Patient does have a past medical history significant for ovarian cancer and we discussed possibly doing a workup which patient declined. Patient has not had any fevers or chills or any abnormal weight loss. Patient has been feeling like herself overall. Patient is to try home exercise program, topical anti-inflammatories, as well as an icing protocol. Patient will get x-rays. This will rule out any bony abnormality. Patient will come back again in 3 weeks for further evaluation and treatment.

## 2014-03-07 NOTE — Addendum Note (Signed)
Addended by: Lyndal Pulley on: 03/07/2014 03:32 PM   Modules accepted: Orders

## 2014-03-07 NOTE — Patient Instructions (Signed)
Good to see you [name] separation.  Exercises 3 times a week.  Chest press and flys but drop 50% of weight. Increase 10% a week.  Ice after activity.  [place] up to 2 times daily.  We will get xray somewhere.  Monday Kellie Moor at [facility].  [place] Come back in 3 weeks.
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
## 2014-03-28 ENCOUNTER — Ambulatory Visit: Payer: Private Health Insurance - Indemnity | Admitting: Family Medicine

## 2014-03-29 ENCOUNTER — Ambulatory Visit (INDEPENDENT_AMBULATORY_CARE_PROVIDER_SITE_OTHER): Payer: Private Health Insurance - Indemnity | Admitting: Family Medicine

## 2014-03-29 ENCOUNTER — Encounter: Payer: Self-pay | Admitting: Family Medicine

## 2014-03-29 VITALS — BP 132/82 | HR 82 | Ht 65.0 in | Wt 182.0 lb

## 2014-03-29 DIAGNOSIS — S29011D Strain of muscle and tendon of front wall of thorax, subsequent encounter: Secondary | ICD-10-CM

## 2014-03-29 DIAGNOSIS — Z5189 Encounter for other specified aftercare: Secondary | ICD-10-CM

## 2014-03-29 DIAGNOSIS — S4360XA Sprain of unspecified sternoclavicular joint, initial encounter: Secondary | ICD-10-CM

## 2014-03-29 NOTE — Patient Instructions (Signed)
Good to see you Continue to watch the area but nothing alarming [employer] orthotics online "total Support" Wide toe box with shoes, Width D See me when you need me.

## 2014-03-29 NOTE — Assessment & Plan Note (Signed)
Patient likely will have a chronic strain. It appears the patient had more of a capsulitis that seems to be changing in size intermittently. I do not see any lymph node patient has no systemic findings admitting think further workup is necessary at this time. Patient will continue to monitor and if has any significant differences such as hardening of the area, non-mobility, or any other systemic findings we discussed previously she'll come back for further evaluation and treatment. This is musculoskeletal in nature.
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
## 2014-04-19 ENCOUNTER — Encounter: Payer: Self-pay | Admitting: Gynecologic Oncology

## 2014-04-19 ENCOUNTER — Ambulatory Visit: Payer: Private Health Insurance - Indemnity | Attending: Gynecologic Oncology | Admitting: Gynecologic Oncology

## 2014-04-19 ENCOUNTER — Ambulatory Visit: Payer: Private Health Insurance - Indemnity

## 2014-04-19 VITALS — BP 134/83 | HR 73 | Temp 98.0°F | Resp 20 | Ht 65.0 in | Wt 179.6 lb

## 2014-04-19 DIAGNOSIS — C569 Malignant neoplasm of unspecified ovary: Secondary | ICD-10-CM | POA: Diagnosis not present

## 2014-04-19 DIAGNOSIS — I10 Essential (primary) hypertension: Secondary | ICD-10-CM | POA: Insufficient documentation

## 2014-04-19 DIAGNOSIS — Z809 Family history of malignant neoplasm, unspecified: Secondary | ICD-10-CM | POA: Insufficient documentation

## 2014-04-19 DIAGNOSIS — Z90710 Acquired absence of both cervix and uterus: Secondary | ICD-10-CM | POA: Diagnosis not present

## 2014-04-19 DIAGNOSIS — Z90722 Acquired absence of ovaries, bilateral: Secondary | ICD-10-CM | POA: Diagnosis not present

## 2014-04-19 NOTE — Patient Instructions (Signed)
We'll notify you the results of your CA 125. Please return to see Dr. Alycia Rossetti in 4 months.

## 2014-04-19 NOTE — Progress Notes (Signed)
Consult Note: Gyn-Onc  Leah Perkins 58 y.o. female  CC:  Chief Complaint  Patient presents with  . 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 At that time she underwent optimal debulking and was treated with 6 cycles of dose dense paclitaxel and carboplatin. She completed her therapy in September 2013. CA 125 initially was 99 and prior to her move from [place] needed at 3. She had a CA 125 performed at the [facility] last month and it was13.6 which was higher than her baseline had been. She had also undergone genetic testing and is BRCA negative. Her gynecologic oncologist in [place] referred her to [place] for followup. He did discuss with her that the recurrence of a woman with stage II cancer would be approximately 30-40% and a discuss options regarding consolidation but opted for close surveillance. She did very well from her chemotherapy and had no significant issues had manageable neutropenia and no significant neuropathy in her performance status was 0. She also has a family history significant for hypertrophic cardiomyopathy she has been tested and does not have the gene. She comes in today for followup with [place].  She had a mammogram 07/25/13 that was unremarkable. She had a colonoscopy in September 2009 that was negative. Her last bone density scan was in October 2011. I last saw her in June 2015 at which time her CA-125 was 4.8.  Interval History:  She herself is been doing very well.  She had her Port-A-Cath removed. She's going to the gym about 5 days a week and goes for an hour to an hour and a half each time. She walks on the treadmill for 30-45 minutes and also is lifting weights. She alternates legs and upper body. She did have a mammogram that was normal in January.  She was recently at a wedding and tripped on the way out of a van. She had a syncopal episode and went  to the emergency room. She will had a head CT that was negative. She did have a contusion on her left elbow and left knee. The emergency room evaluation was otherwise negative and she is feeling fine. There are no new medical problems and her family. She is looking forward to going to [place] with her daughter and grandson in the upcoming weeks.  Review of Systems:  Constitutional: Denies fevers Skin: No rash, sores, jaundice, itching, or dryness.  Cardiovascular: No chest pain, shortness of breath, or edema  Pulmonary: No cough or wheeze.  Gastro Intestinal:  No nausea, vomiting, constipation, or diarrhea reported. No bright red blood per rectum or change in bowel movement. No bloating or early satiety. Genitourinary: No frequency, urgency, or dysuria.  Denies vaginal bleeding and discharge.  Musculoskeletal: No myalgia, arthralgia Neurologic: No weakness, numbness, or change in gait.  Psychology: No changes  Current Meds:  Outpatient Encounter Prescriptions as of 04/19/2014  Medication Sig  . cholecalciferol (VITAMIN D) 1000 UNITS tablet Take 1,000 Units by mouth daily.  Marland Kitchen lisinopril (PRINIVIL,ZESTRIL) 20 MG tablet Take 40 mg by mouth every morning.   . Multiple Vitamins-Minerals (MULTIVITAMIN WITH MINERALS) tablet Take 1 tablet by mouth daily.  . vitamin C (ASCORBIC ACID) 500 MG tablet Take 500 mg by mouth 2 (two) times daily.  . [DISCONTINUED] Calcium Carbonate-Vitamin D (CALCIUM 600 + D PO) Take 1 tablet by mouth 2 (  two) times daily.  . [DISCONTINUED] Calcium Carb-Cholecalciferol (CALCIUM + D3) 600-200 MG-UNIT TABS Take by mouth.  . [DISCONTINUED] lisinopril (PRINIVIL,ZESTRIL) 20 MG tablet Take 20 mg by mouth.  . [DISCONTINUED] Multiple Vitamin (MULTIVITAMIN) capsule Take 1 capsule by mouth.    Allergy:  Allergies  Allergen Reactions  . Sulfa Antibiotics     "does not remember"    Social Hx:   History   Social History  . Marital Status: Married    Spouse Name: N/A     Number of Children: N/A  . Years of Education: N/A   Occupational History  . Not on file.   Social History Main Topics  . Smoking status: Never Smoker   . Smokeless tobacco: Never Used  . Alcohol Use: Yes     Comment: Socially  . Drug Use: No  . Sexual Activity: Yes   Other Topics Concern  . Not on file   Social History Narrative  . No narrative on file    Past Surgical Hx:  Past Surgical History  Procedure Laterality Date  . Appendectomy    . Abdominal hysterectomy  3/13 [facility] optimal tumor debulking  . Arthoscopy Right     Knee    Past Medical Hx:  Past Medical History  Diagnosis Date  . Hypertension   . Ovarian cancer 09/2011    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 9/13    Family Hx:  Family History  Problem Relation Age of Onset  . Hypertension Mother   . Hypertrophic cardiomyopathy Mother     pt negativ rofr gene 08/05/12  . Alzheimer's disease Father   . Cancer Brother   . Alzheimer's disease Paternal Grandmother   . Hypertrophic cardiomyopathy Son     Vitals:  Blood pressure 134/83, pulse 73, temperature 98 F (36.7 C), temperature source Oral, resp. rate 20, height $RemoveBe'5\' 5"'yUmgkncsh$  (1.651 m), weight 179 lb 9.6 oz (81.466 kg).  Physical Exam: Well-nourished well-developed female in no acute distress.  Neck: Supple, no lymphadenopathy no thyromegaly.  Lungs: Clear to auscultation bilaterally.   Cardiac: Regular rate rhythm.  Abdomen: Shows well-healed vertical midline incision. There is no evidence of an incisional hernia. Abdomen is soft, nontender, nondistended. There is no palpable mass or hepatomsplenomegaly. There is no fluid wave.  Groins: There's no lymphadenopathy.  Extremities: There is no edema.  Pelvic: Normal external female genitalia. Bimanual examination reveals no masses or nodularity rectal confirms.  Assessment/Plan:  58 year old with stage IIc serous ovarian carcinoma diagnosed and treated in 2013. She was  initially diagnosed in March of 2013 and she completed her therapy in September of 2013 without significant sequela. CA 125 is informative for her as it was elevated at the time of her diagnosis. She had genetic testing and is negative for BRCA gene mutation. She also had genetic testing for hypertrophic cardiomyopathy and that was similarly negative. She return to see me in 3 months.   We'll followup in results of her CA 125. We will notify her of the results. She will return to see me in 4 months.                Laval Cafaro A., MD 04/19/2014, 10:58 AM

## 2014-04-20 LAB — CA 125: CA 125: 4 U/mL (ref <35)

## 2014-04-20 LAB — CA 125(PREVIOUS METHOD): CA 125: 4.9 U/mL (ref 0.0–30.2)

## 2014-04-26 ENCOUNTER — Telehealth: Payer: Self-pay | Admitting: *Deleted

## 2014-04-26 NOTE — Telephone Encounter (Signed)
Called patient and let her know CA 125 is in normal range. Pt appreciative of call.

## 2014-05-21 ENCOUNTER — Other Ambulatory Visit: Payer: Self-pay | Admitting: *Deleted

## 2014-05-21 DIAGNOSIS — Z1231 Encounter for screening mammogram for malignant neoplasm of breast: Secondary | ICD-10-CM

## 2014-07-09 ENCOUNTER — Encounter: Payer: Self-pay | Admitting: *Deleted

## 2014-07-10 ENCOUNTER — Encounter: Payer: Self-pay | Admitting: Obstetrics & Gynecology

## 2014-07-24 ENCOUNTER — Other Ambulatory Visit: Payer: Self-pay

## 2014-07-24 DIAGNOSIS — Z1231 Encounter for screening mammogram for malignant neoplasm of breast: Secondary | ICD-10-CM

## 2014-07-27 ENCOUNTER — Ambulatory Visit
Admission: RE | Admit: 2014-07-27 | Discharge: 2014-07-27 | Disposition: A | Discharge status: Home / Self Care | Payer: 59 | Source: Ambulatory Visit

## 2014-07-27 DIAGNOSIS — Z1231 Encounter for screening mammogram for malignant neoplasm of breast: Secondary | ICD-10-CM

## 2014-09-06 ENCOUNTER — Ambulatory Visit: Payer: 59

## 2014-09-06 ENCOUNTER — Ambulatory Visit: Payer: 59 | Attending: Gynecologic Oncology | Admitting: Gynecologic Oncology

## 2014-09-06 ENCOUNTER — Encounter: Payer: Self-pay | Admitting: Gynecologic Oncology

## 2014-09-06 VITALS — BP 148/93 | HR 74 | Temp 98.2°F | Resp 16 | Ht 65.0 in | Wt 179.6 lb

## 2014-09-06 DIAGNOSIS — Z8543 Personal history of malignant neoplasm of ovary: Secondary | ICD-10-CM | POA: Insufficient documentation

## 2014-09-06 DIAGNOSIS — C569 Malignant neoplasm of unspecified ovary: Secondary | ICD-10-CM

## 2014-09-06 DIAGNOSIS — I10 Essential (primary) hypertension: Secondary | ICD-10-CM | POA: Insufficient documentation

## 2014-09-06 DIAGNOSIS — Z08 Encounter for follow-up examination after completed treatment for malignant neoplasm: Secondary | ICD-10-CM | POA: Diagnosis not present

## 2014-09-06 DIAGNOSIS — Z9221 Personal history of antineoplastic chemotherapy: Secondary | ICD-10-CM | POA: Diagnosis not present

## 2014-09-06 DIAGNOSIS — Z90710 Acquired absence of both cervix and uterus: Secondary | ICD-10-CM | POA: Diagnosis not present

## 2014-09-06 NOTE — Patient Instructions (Addendum)
We will follow-up in results of your CA-125 and notify you of these results. Please return to see [place] in 4 months.

## 2014-09-06 NOTE — Progress Notes (Signed)
Consult Note: Gyn-Onc  Leah Perkins 59 y.o. female  CC:  Chief Complaint  Patient presents with  . 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 At that time she underwent optimal debulking and was treated with 6 cycles of dose dense paclitaxel and carboplatin. She completed her therapy in September 2013. CA 125 initially was 99 and prior to her move from [place] needed at 3. She had a CA 125 performed at the [facility] last month and it was13.6 which was higher than her baseline had been. She had also undergone genetic testing and is BRCA negative. Her gynecologic oncologist in [place] referred her to [place] for followup. He did discuss with her that the recurrence of a woman with stage II cancer would be approximately 30-40% and a discuss options regarding consolidation but opted for close surveillance. She did very well from her chemotherapy and had no significant issues had manageable neutropenia and no significant neuropathy in her performance status was 0. She also has a family history significant for hypertrophic cardiomyopathy she has been tested and does not have the gene. She comes in today for followup with [place].  She had a mammogram recently that was unremarkable. She had a colonoscopy in September 2009 that was negative. Her last bone density scan was in October 2011. I last saw her in October 2015 at which time her CA-125 was 4.9.  Interval History:  She herself is been doing very well.  She had her Port-A-Cath removed. She is a bit upset today as her blood pressure is 148/93. Recently her lisinopril dose was dropped from 20 mg a day to 10 mg. She states at home when she checks her blood pressure is 132/82 and she is very worried about it. She states she has changed her diet she's eating smaller meals she's also change the way she exercises at the gym. She continues to go to  the gym 5 days a week and is able to do the elliptical for 35 minutes at a time she is not really noticed any significant weight loss but does feel that her closer fitting a little bit differently. She does occasionally come plane of some deep dyspareunia which may or may not be necessarily due to some vaginal dryness. She otherwise has no complaints. There are no new medical problems and her family. She did have a mammogram that was normal in January.  Review of Systems:  Constitutional: Denies fevers Skin: No rash Cardiovascular: No chest pain, shortness of breath Pulmonary: No cough Gastro Intestinal:  No nausea, vomiting, constipation, or diarrhea reported. No bright red blood per rectum or change in bowel movement. No bloating or early satiety. Genitourinary: No frequency, urgency, or dysuria.  Denies vaginal bleeding and discharge.  Musculoskeletal: No myalgia, arthralgia Neurologic: No weakness, numbness, or change in gait.  Psychology: No changes  Current Meds:  Outpatient Encounter Prescriptions as of 09/06/2014  Medication Sig  . cholecalciferol (VITAMIN D) 1000 UNITS tablet Take 1,000 Units by mouth daily.  Marland Kitchen lisinopril (PRINIVIL,ZESTRIL) 20 MG tablet Take 10 mg by mouth daily.   . Multiple Vitamins-Minerals (MULTIVITAMIN WITH MINERALS) tablet Take 1 tablet by mouth daily.  . vitamin C (ASCORBIC ACID) 500 MG tablet Take 500 mg by mouth 2 (two) times daily.    Allergy:  Allergies  Allergen Reactions  . Sulfa Antibiotics     "  does not remember"    Social Hx:   History   Social History  . Marital Status: Married    Spouse Name: N/A  . Number of Children: N/A  . Years of Education: N/A   Occupational History  . Not on file.   Social History Main Topics  . Smoking status: Never Smoker   . Smokeless tobacco: Never Used  . Alcohol Use: Yes     Comment: Socially  . Drug Use: No  . Sexual Activity: Yes   Other Topics Concern  . Not on file   Social History  Narrative    Past Surgical Hx:  Past Surgical History  Procedure Laterality Date  . Appendectomy    . Abdominal hysterectomy  3/13 [facility] optimal tumor debulking  . Arthoscopy Right     Knee    Past Medical Hx:  Past Medical History  Diagnosis Date  . Hypertension   . Ovarian cancer 09/2011    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 9/13    Family Hx:  Family History  Problem Relation Age of Onset  . Hypertension Mother   . Hypertrophic cardiomyopathy Mother     pt negativ rofr gene 08/05/12  . Alzheimer's disease Father   . Cancer Brother   . Alzheimer's disease Paternal Grandmother   . Hypertrophic cardiomyopathy Son     Vitals:  Blood pressure 148/93, pulse 74, temperature 98.2 F (36.8 C), temperature source Oral, resp. rate 16, height _0  (1.651 m), weight 179 lb 9.6 oz (81.466 kg).  Physical Exam: Well-nourished well-developed female in no acute distress.  Neck: Supple, no lymphadenopathy no thyromegaly.  Lungs: Clear to auscultation bilaterally.   Cardiac: Regular rate rhythm.  Abdomen: Shows well-healed vertical midline incision. There is no evidence of an incisional hernia. Abdomen is soft, nontender, nondistended. There is no palpable mass or hepatomsplenomegaly. There is no fluid wave.  Groins: There's no lymphadenopathy.  Extremities: There is no edema.  Pelvic: Normal external female genitalia. Bimanual examination reveals no masses or nodularity rectal confirms.  Assessment/Plan:  59 year old with stage IIc serous ovarian carcinoma diagnosed and treated in 2013. She was initially diagnosed in March of 2013 and she completed her therapy in September of 2013 without significant sequela. CA 125 is informative for her as it was elevated at the time of her diagnosis. She had genetic testing and is negative for BRCA gene mutation. She also had genetic testing for hypertrophic cardiomyopathy and that was similarly negative. We'll  followup in results of her CA 125. We will notify her of the results. She will return to see me in 4 months.                Zakiah Beckerman A., MD 09/06/2014, 10:34 AM

## 2014-09-07 LAB — CA 125: CA 125: 4 U/mL (ref <35)

## 2014-09-07 LAB — CA 125(PREVIOUS METHOD): CA 125: 5.8 U/mL (ref 0.0–30.2)

## 2014-09-10 ENCOUNTER — Telehealth: Payer: Self-pay | Admitting: *Deleted

## 2014-09-10 NOTE — Telephone Encounter (Signed)
Per Joylene John, NP called patient and let her know CA 125 is in normal range. Pt appreciative of call.

## 2014-11-28 ENCOUNTER — Other Ambulatory Visit: Payer: 59

## 2014-11-28 ENCOUNTER — Encounter: Payer: Self-pay | Admitting: Gynecologic Oncology

## 2014-11-28 ENCOUNTER — Ambulatory Visit: Payer: 59 | Attending: Gynecologic Oncology | Admitting: Gynecologic Oncology

## 2014-11-28 VITALS — BP 122/86 | HR 79 | Temp 98.1°F | Resp 18 | Ht 65.0 in | Wt 183.5 lb

## 2014-11-28 DIAGNOSIS — C569 Malignant neoplasm of unspecified ovary: Secondary | ICD-10-CM | POA: Diagnosis present

## 2014-11-28 DIAGNOSIS — Z8543 Personal history of malignant neoplasm of ovary: Secondary | ICD-10-CM

## 2014-11-28 NOTE — Progress Notes (Signed)
Consult Note: Gyn-Onc  Leah Perkins 59 y.o. female  CC:  Chief Complaint  Patient presents with  . 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 At that time she underwent optimal debulking and was treated with 6 cycles of dose dense paclitaxel and carboplatin. She completed her therapy in September 2013. CA 125 initially was 99 and prior to her move from [place] needed at 3. She had a CA 125 performed at the [facility] last month and it was13.6 which was higher than her baseline had been. She had also undergone genetic testing and is BRCA negative. Her gynecologic oncologist in [place] referred her to [place] for followup. He did discuss with her that the recurrence of a woman with stage II cancer would be approximately 30-40% and a discuss options regarding consolidation but opted for close surveillance. She did very well from her chemotherapy and had no significant issues had manageable neutropenia and no significant neuropathy in her performance status was 0. She also has a family history significant for hypertrophic cardiomyopathy she has been tested and does not have the gene. She comes in today for followup with [place].  She had a mammogram recently that was unremarkable. She had a colonoscopy in September 2009 that was negative. Her last bone density scan was in October 2011. I last saw her in February 2016 at which time her CA-125 was 4.0.  Interval History:  She herself is been doing very well.  She had her Port-A-Cath removed. She and her husband recently returned from a cruise to the [place] and they're leaving this coming Friday for another cruise to [place]. She gained a little bit of weight on the last treatment. Her blood pressures up and down at home and she's not sure why she was so anxious today that she was here. She otherwise denies any significant complaints.  She is able to be quite active without any issue.  Review of Systems: Constitutional: Denies fevers Skin: No rash Cardiovascular: No chest pain, shortness of breath Pulmonary: No cough Gastro Intestinal:  No nausea, vomiting, constipation, or diarrhea reported. No change in bowel movement. No bloating or early satiety. Genitourinary: No frequency, urgency, or dysuria.  Denies vaginal bleeding and discharge.  Musculoskeletal: No myalgia, arthralgia Neurologic: No weakness, numbness, or change in gait.  Psychology: No changes  Current Meds:  Outpatient Encounter Prescriptions as of 11/28/2014  Medication Sig  . Calcium Carb-Cholecalciferol (CALCIUM 600 + D PO) Take 1 tablet by mouth 2 (two) times daily.  . cholecalciferol (VITAMIN D) 1000 UNITS tablet Take 1,000 Units by mouth daily.  Marland Kitchen lisinopril (PRINIVIL,ZESTRIL) 20 MG tablet Take 10 mg by mouth daily.   . Multiple Vitamins-Minerals (MULTIVITAMIN WITH MINERALS) tablet Take 1 tablet by mouth daily.  . vitamin C (ASCORBIC ACID) 500 MG tablet Take 500 mg by mouth 2 (two) times daily.   No facility-administered encounter medications on file as of 11/28/2014.    Allergy:  Allergies  Allergen Reactions  . Sulfa Antibiotics     "does not remember"    Social Hx:   History   Social History  . Marital Status: Married    Spouse Name: N/A  . Number of Children: N/A  . Years of Education: N/A   Occupational History  . Not on file.   Social History Main Topics  . Smoking status:  Never Smoker   . Smokeless tobacco: Never Used  . Alcohol Use: Yes     Comment: Socially  . Drug Use: No  . Sexual Activity: Yes   Other Topics Concern  . Not on file   Social History Narrative    Past Surgical Hx:  Past Surgical History  Procedure Laterality Date  . Appendectomy    . Abdominal hysterectomy  3/13 [facility] optimal tumor debulking  . Arthoscopy Right     Knee    Past Medical Hx:  Past Medical History  Diagnosis Date  .  Hypertension   . Ovarian cancer 09/2011    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 9/13    Family Hx:  Family History  Problem Relation Age of Onset  . Hypertension Mother   . Hypertrophic cardiomyopathy Mother     pt negativ rofr gene 08/05/12  . Alzheimer's disease Father   . Cancer Brother   . Alzheimer's disease Paternal Grandmother   . Hypertrophic cardiomyopathy Son     Vitals:  Blood pressure 122/86, pulse 79, temperature 98.1 F (36.7 C), temperature source Oral, resp. rate 18, height _0  (1.651 m), weight 183 lb 8 oz (83.235 kg), SpO2 100 %.  Physical Exam: Well-nourished well-developed female in no acute distress.  Neck: Supple, no lymphadenopathy no thyromegaly.  Lungs: Clear to auscultation bilaterally.   Cardiac: Regular rate rhythm.  Abdomen: Shows well-healed vertical midline incision. There is no evidence of an incisional hernia. Abdomen is soft, nontender, nondistended. There is no palpable mass or hepatomsplenomegaly. There is no fluid wave.  Groins: There's no lymphadenopathy.  Extremities: There is no edema.  Pelvic: Normal external female genitalia. Bimanual examination reveals no masses or nodularity rectal confirms.  Assessment/Plan:  59 year old with stage IIc serous ovarian carcinoma diagnosed and treated in 2013. She was initially diagnosed in March of 2013 and she completed her therapy in September of 2013 without significant sequela. CA 125 is informative for her as it was elevated at the time of her diagnosis. She had genetic testing and is negative for BRCA gene mutation. She also had genetic testing for hypertrophic cardiomyopathy and that was similarly negative. We'll followup in results of her CA 125. We will notify her of the results. She will return to see me in 6 months.                Corbin Hott A., MD 11/28/2014, 1:28 PM

## 2014-11-28 NOTE — Patient Instructions (Addendum)
We will notify you of the results of your CA-125 from today. Please return to see [place] in 6 months as scheduled above.

## 2014-11-29 ENCOUNTER — Telehealth: Payer: Self-pay | Admitting: *Deleted

## 2014-11-29 LAB — CA 125: CA 125: 5 U/mL (ref <35)

## 2014-11-29 NOTE — Telephone Encounter (Signed)
Per Joylene John, NP, patient notified of normal CA125 results. No other concerns noted at this time.

## 2014-12-11 ENCOUNTER — Ambulatory Visit: Payer: 59 | Admitting: Gynecologic Oncology

## 2014-12-13 ENCOUNTER — Ambulatory Visit: Payer: 59 | Admitting: Gynecologic Oncology

## 2014-12-27 ENCOUNTER — Ambulatory Visit: Payer: 59 | Admitting: Gynecologic Oncology

## 2015-06-12 ENCOUNTER — Ambulatory Visit: Payer: 59 | Attending: Gynecologic Oncology | Admitting: Gynecologic Oncology

## 2015-06-12 ENCOUNTER — Other Ambulatory Visit (HOSPITAL_BASED_OUTPATIENT_CLINIC_OR_DEPARTMENT_OTHER): Payer: 59

## 2015-06-12 ENCOUNTER — Encounter: Payer: Self-pay | Admitting: Gynecologic Oncology

## 2015-06-12 DIAGNOSIS — C569 Malignant neoplasm of unspecified ovary: Secondary | ICD-10-CM | POA: Insufficient documentation

## 2015-06-12 DIAGNOSIS — Z8543 Personal history of malignant neoplasm of ovary: Secondary | ICD-10-CM

## 2015-06-12 NOTE — Progress Notes (Signed)
Consult Note: Gyn-Onc  Leah Perkins 59 y.o. female  CC:  Chief Complaint  Patient presents with  . 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 At that time she underwent optimal debulking and was treated with 6 cycles of dose dense paclitaxel and carboplatin. She completed her therapy in September 2013. CA 125 initially was 99 and prior to her move from [place] needed at 3. She had a CA 125 performed at the [facility] last month and it was13.6 which was higher than her baseline had been. She had also undergone genetic testing and is BRCA negative. Her gynecologic oncologist in [place] referred her to [place] for followup. He did discuss with her that the recurrence of a woman with stage II cancer would be approximately 30-40% and a discuss options regarding consolidation but opted for close surveillance. She did very well from her chemotherapy and had no significant issues had manageable neutropenia and no significant neuropathy in her performance status was 0. She also has a family history significant for hypertrophic cardiomyopathy she has been tested and does not have the gene. She comes in today for followup with [place].  She had a colonoscopy in September 2009 that was negative. Her last bone density scan was in October 2011. I last saw her in May 2016 at which time her CA-125 was 5.0.  Interval History:  She herself is been doing very well.  She had her Port-A-Cath removed. She is exercising quite regularly for about 90 minutes which is about 700 cal that she is unable to lose weight. In fact, she gained 4 pounds since we last saw her. She's recently been placed on a beta blocker. She can ask your primary care physician to check her thyroid function and she is very distressed by her weight. I discussed the emerging data that beta blockers may be beneficial in patients with  ovarian cancer. She was very pleased to hear this.  Review of Systems: Constitutional: Denies fevers Skin: No rash Cardiovascular: No chest pain, shortness of breath Pulmonary: No cough Gastro Intestinal:  No nausea, vomiting, constipation, or diarrhea reported. No change in bowel movement. No bloating or early satiety. Genitourinary: No frequency, urgency, or dysuria.  Denies vaginal bleeding and discharge.  Musculoskeletal: No myalgia, arthralgia Neurologic: No weakness, numbness, or change in gait.  Psychology: No changes  Current Meds:  Outpatient Encounter Prescriptions as of 06/12/2015  Medication Sig  . atenolol (TENORMIN) 25 MG tablet Take 25 mg by mouth daily.  . Calcium Carb-Cholecalciferol (CALCIUM 600 + D PO) Take 1 tablet by mouth 2 (two) times daily.  . cholecalciferol (VITAMIN D) 1000 UNITS tablet Take 1,000 Units by mouth daily.  . Multiple Vitamins-Minerals (MULTIVITAMIN WITH MINERALS) tablet Take 1 tablet by mouth daily.  . vitamin C (ASCORBIC ACID) 500 MG tablet Take 500 mg by mouth 2 (two) times daily.  . [DISCONTINUED] lisinopril (PRINIVIL,ZESTRIL) 20 MG tablet Take 10 mg by mouth daily.    No facility-administered encounter medications on file as of 06/12/2015.    Allergy:  Allergies  Allergen Reactions  . Sulfa Antibiotics     "does not remember"    Social Hx:   Social History   Social History  . Marital Status: Married    Spouse Name: N/A  . Number of Children: N/A  . Years of  Education: N/A   Occupational History  . Not on file.   Social History Main Topics  . Smoking status: Never Smoker   . Smokeless tobacco: Never Used  . Alcohol Use: Yes     Comment: Socially  . Drug Use: No  . Sexual Activity: Yes   Other Topics Concern  . Not on file   Social History Narrative    Past Surgical Hx:  Past Surgical History  Procedure Laterality Date  . Appendectomy    . Abdominal hysterectomy  3/13 [facility] optimal tumor debulking  . Arthoscopy  Right     Knee    Past Medical Hx:  Past Medical History  Diagnosis Date  . Hypertension   . Ovarian cancer ([place]) 09/2011    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 9/13    Family Hx:  Family History  Problem Relation Age of Onset  . Hypertension Mother   . Hypertrophic cardiomyopathy Mother     pt negativ rofr gene 08/05/12  . Alzheimer's disease Father   . Cancer Brother   . Alzheimer's disease Paternal Grandmother   . Hypertrophic cardiomyopathy Son     Vitals:  There were no vitals taken for this visit.  Physical Exam: Well-nourished well-developed female in no acute distress.  Neck: Supple, no lymphadenopathy no thyromegaly.  Lungs: Clear to auscultation bilaterally.   Cardiac: Regular rate rhythm.  Abdomen: Shows well-healed vertical midline incision. There is no evidence of an incisional hernia. Abdomen is soft, nontender, nondistended. There is no palpable mass or hepatomsplenomegaly. There is no fluid wave.  Groins: There's no lymphadenopathy.  Extremities: There is no edema.  Pelvic: Normal external female genitalia. Bimanual examination reveals no masses or nodularity rectal confirms.  Assessment/Plan:  59 year old with stage IIc serous ovarian carcinoma diagnosed and treated in 2013. She was initially diagnosed in March of 2013 and she completed her therapy in September of 2013 without significant sequela. CA 125 is informative for her as it was elevated at the time of her diagnosis. She had genetic testing and is negative for BRCA gene mutation. She also had genetic testing for hypertrophic cardiomyopathy and that was similarly negative. We'll followup in results of her CA 125. We will notify her of the results. She will return to see me in 6 months. She will follow up with her other physicians as scheduled.       Darshay Deupree A., MD 06/12/2015, 1:25 PM

## 2015-06-12 NOTE — Patient Instructions (Signed)
We will notify you of the results of your bloodwork from today. Return to see Dr. Alycia Rossetti in 6 months.

## 2015-06-13 ENCOUNTER — Telehealth: Payer: Self-pay

## 2015-06-13 LAB — CA 125: CA 125: 4 U/mL (ref <35)

## 2015-06-13 NOTE — Telephone Encounter (Signed)
Orders received from [facility] to contact the patient with CA 125 level ( 4 ) Normal . Patient conatcted , patient states understanding , denies further questions at this time .

## 2015-06-26 ENCOUNTER — Other Ambulatory Visit: Payer: Self-pay

## 2015-06-26 DIAGNOSIS — Z1231 Encounter for screening mammogram for malignant neoplasm of breast: Secondary | ICD-10-CM

## 2015-07-14 HISTORY — PX: HAMMER TOE SURGERY: SHX385

## 2015-07-29 ENCOUNTER — Ambulatory Visit
Admission: RE | Admit: 2015-07-29 | Discharge: 2015-07-29 | Disposition: A | Discharge status: Home / Self Care | Payer: 59 | Source: Ambulatory Visit

## 2015-07-29 DIAGNOSIS — Z1231 Encounter for screening mammogram for malignant neoplasm of breast: Secondary | ICD-10-CM

## 2015-11-27 ENCOUNTER — Encounter: Payer: Self-pay | Admitting: Gynecologic Oncology

## 2015-11-27 ENCOUNTER — Other Ambulatory Visit (HOSPITAL_BASED_OUTPATIENT_CLINIC_OR_DEPARTMENT_OTHER): Payer: 59

## 2015-11-27 ENCOUNTER — Ambulatory Visit: Payer: 59 | Attending: Gynecologic Oncology | Admitting: Gynecologic Oncology

## 2015-11-27 VITALS — BP 140/94 | HR 67 | Temp 98.1°F | Resp 18 | Ht 65.0 in | Wt 187.6 lb

## 2015-11-27 DIAGNOSIS — Z8543 Personal history of malignant neoplasm of ovary: Secondary | ICD-10-CM | POA: Diagnosis not present

## 2015-11-27 DIAGNOSIS — C569 Malignant neoplasm of unspecified ovary: Secondary | ICD-10-CM

## 2015-11-27 DIAGNOSIS — Z79899 Other long term (current) drug therapy: Secondary | ICD-10-CM | POA: Diagnosis not present

## 2015-11-27 DIAGNOSIS — I10 Essential (primary) hypertension: Secondary | ICD-10-CM | POA: Insufficient documentation

## 2015-11-27 DIAGNOSIS — Z888 Allergy status to other drugs, medicaments and biological substances status: Secondary | ICD-10-CM | POA: Insufficient documentation

## 2015-11-27 NOTE — Progress Notes (Signed)
Consult Note: Gyn-Onc  Leah Perkins 60 y.o. female  CC:  Chief Complaint  Patient presents with  . 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 At that time she underwent optimal debulking and was treated with 6 cycles of dose dense paclitaxel and carboplatin. She completed her therapy in September 2013. CA 125 initially was 99 and prior to her move from [place] it nadired at 3. She had also undergone genetic testing and is BRCA negative. Her gynecologic oncologist in [place] referred her to [place] for followup. He did discuss with her that the recurrence of a woman with stage II cancer would be approximately 30-40% and a discuss options regarding consolidation but opted for close surveillance. She did very well from her chemotherapy and had no significant issues had manageable neutropenia and no significant neuropathy in her performance status was 0. She also has a family history significant for hypertrophic cardiomyopathy she has been tested and does not have the gene.   She had a colonoscopy in September 2009 that was negative. Her last bone density scan was in October 2011. I last saw her in November 2016 at which time her CA-125 was 4.0.  Interval History:  She herself is been doing very well.  She is very excited that she is going on a cruise from [place] to the [employer] for several days. She continues to exercise quite regularly. She exercises 4-6 days per week doing both cardio and weight lifting. She never had her thyroid done but her weight is maintaining which she's pleased with. She does have some vaginal dryness which is improved with lubrication. She is up-to-date on her mammograms. There are no new medical problems in her family  Review of Systems: Constitutional: Denies fevers Skin: No rash Cardiovascular: No chest pain, shortness of breath Pulmonary: No  cough Gastro Intestinal:  No nausea, vomiting, constipation, or diarrhea reported. No change in bowel movement. No bloating or early satiety. Genitourinary: No pelvic pressure or urgency.  Denies vaginal bleeding and discharge.  Musculoskeletal: No myalgia, arthralgia Neurologic: No weakness, numbness, or change in gait.  Psychology: No changes  Current Meds:  Outpatient Encounter Prescriptions as of 11/27/2015  Medication Sig  . atenolol (TENORMIN) 25 MG tablet Take 25 mg by mouth daily.  . Calcium Carb-Cholecalciferol (CALCIUM 600 + D PO) Take 1 tablet by mouth 2 (two) times daily.  . cholecalciferol (VITAMIN D) 1000 UNITS tablet Take 1,000 Units by mouth daily.  . Multiple Vitamins-Minerals (MULTIVITAMIN WITH MINERALS) tablet Take 1 tablet by mouth daily.  . vitamin C (ASCORBIC ACID) 500 MG tablet Take 500 mg by mouth 2 (two) times daily.   No facility-administered encounter medications on file as of 11/27/2015.    Allergy:  Allergies  Allergen Reactions  . Sulfa Antibiotics     "does not remember"    Social Hx:   Social History   Social History  . Marital Status: Married    Spouse Name: N/A  . Number of Children: N/A  . Years of Education: N/A   Occupational History  . Not on file.   Social History Main Topics  . Smoking status: Never Smoker   . Smokeless tobacco: Never Used  . Alcohol Use: Yes     Comment: Socially  . Drug Use: No  . Sexual Activity: Yes   Other Topics  Concern  . Not on file   Social History Narrative    Past Surgical Hx:  Past Surgical History  Procedure Laterality Date  . Appendectomy    . Abdominal hysterectomy  3/13 [facility] optimal tumor debulking  . Arthoscopy Right     Knee    Past Medical Hx:  Past Medical History  Diagnosis Date  . Hypertension   . Ovarian cancer ([place]) 09/2011    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 9/13    Family Hx:  Family History  Problem Relation Age of Onset  . Hypertension  Mother   . Hypertrophic cardiomyopathy Mother     pt negativ rofr gene 08/05/12  . Alzheimer's disease Father   . Cancer Brother   . Alzheimer's disease Paternal Grandmother   . Hypertrophic cardiomyopathy Son     Vitals:  Blood pressure 140/94, pulse 67, temperature 98.1 F (36.7 C), temperature source Oral, resp. rate 18, height _0  (1.651 m), weight 187 lb 9.6 oz (85.095 kg), SpO2 98 %.  Physical Exam: Well-nourished well-developed female in no acute distress.  Neck: Supple, no lymphadenopathy no thyromegaly.  Lungs: Clear to auscultation bilaterally.   Cardiac: Regular rate rhythm.  Abdomen: Shows well-healed vertical midline incision. There is no evidence of an incisional hernia. Abdomen is soft, nontender, nondistended. There is no palpable mass or hepatomsplenomegaly. There is no fluid wave.  Groins: There's no lymphadenopathy.  Extremities: There is no edema.  Pelvic: Normal external female genitalia. Bimanual examination reveals no masses or nodularity rectal confirms.  Assessment/Plan:  60 year old with stage IIc serous ovarian carcinoma diagnosed and treated in 2013. She was initially diagnosed in March of 2013 and she completed her therapy in September of 2013 without significant sequela. CA 125 is informative for her as it was elevated at the time of her diagnosis. She had genetic testing and is negative for BRCA gene mutation. She also had genetic testing for hypertrophic cardiomyopathy and that was similarly negative. We'll followup in results of her CA 125. We will notify her of the results.  She will follow up with her other physicians as scheduled and will return to see [place] in 6 months.     Tyah Acord A., MD 11/27/2015, 1:27 PM

## 2015-11-27 NOTE — Patient Instructions (Signed)
We will notify you of your results from today. Please return to see [place] in 6 months. Have a wonderful time on your cruise!

## 2015-11-28 ENCOUNTER — Telehealth: Payer: Self-pay | Admitting: Gynecologic Oncology

## 2015-11-28 LAB — CA 125: CANCER ANTIGEN (CA) 125: 12.7 U/mL (ref 0.0–38.1)

## 2015-11-28 LAB — CANCER ANTIGEN 125 (PARALLEL TESTING): CA 125: 4 U/mL (ref <35)

## 2015-11-28 NOTE — Telephone Encounter (Signed)
Called patient at home and spoke with husband.  Pt at the gym.  Advised of CA 125 result and also released in Mychart.  Advised to please let her know to call for any questions or concerns.

## 2016-01-26 IMAGING — US US RETROPERITONEAL LIMITED
1 series · 14 of 24 positions shown · non-contrast
Comparison: None.

CLINICAL DATA: Atherosclerosis.

EXAM:
ULTRASOUND OF ABDOMINAL AORTA
TECHNIQUE: Ultrasound examination of the abdominal aorta was performed to
evaluate for abdominal aortic aneurysm.

[Series 1: us retroperitoneal limited · 0.30mm/px · 14 of 24 slices shown]
[im 1/24]
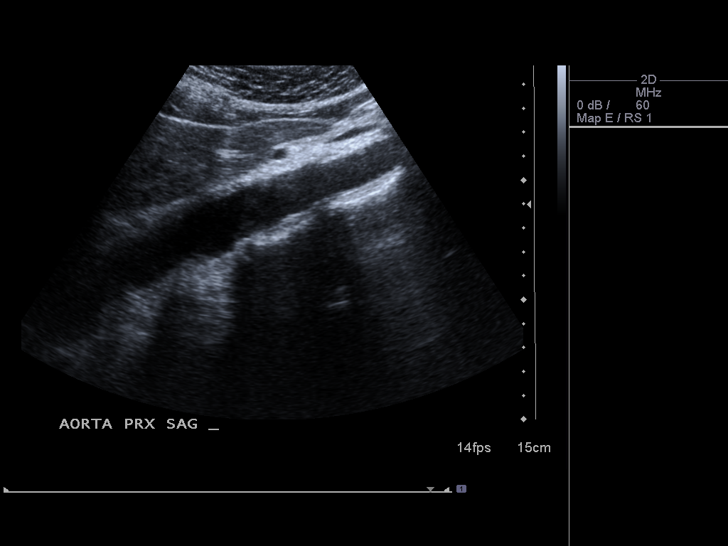
[im 3/24]
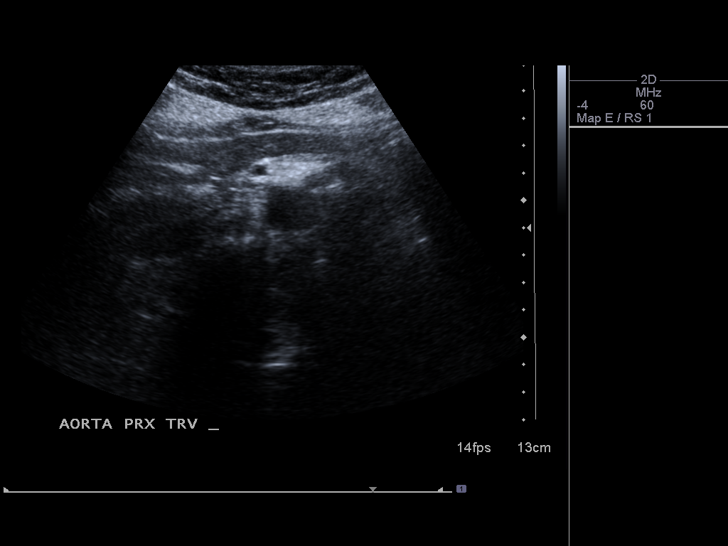
[im 5/24]
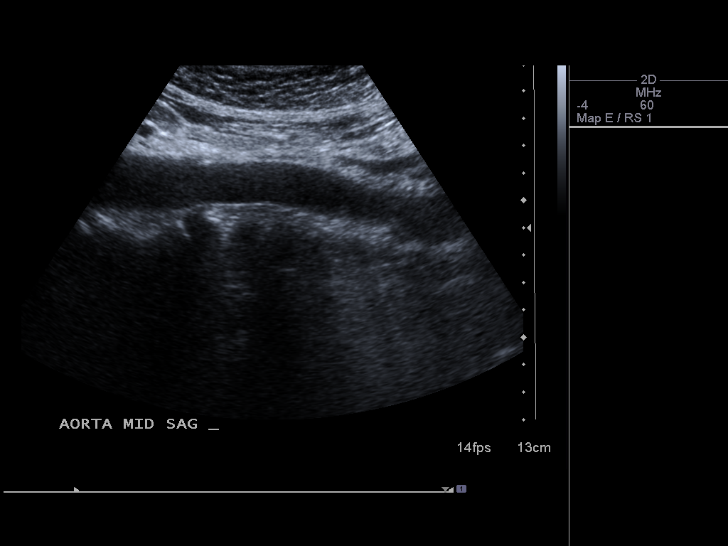
[im 7/24]
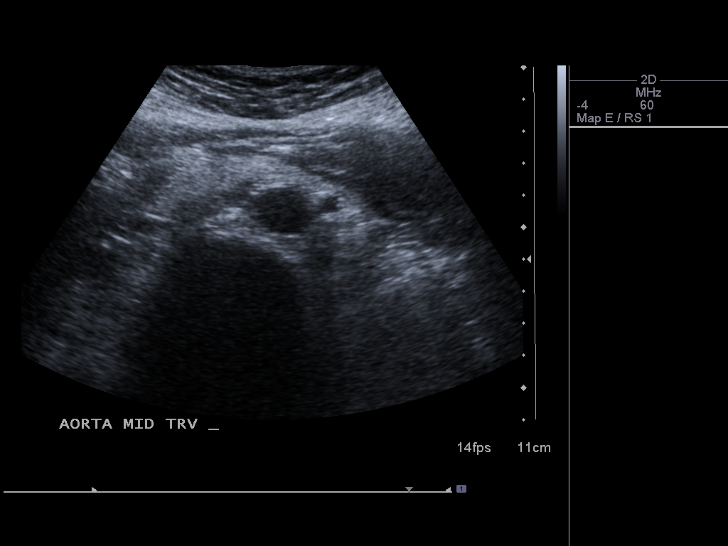
[im 8/24]
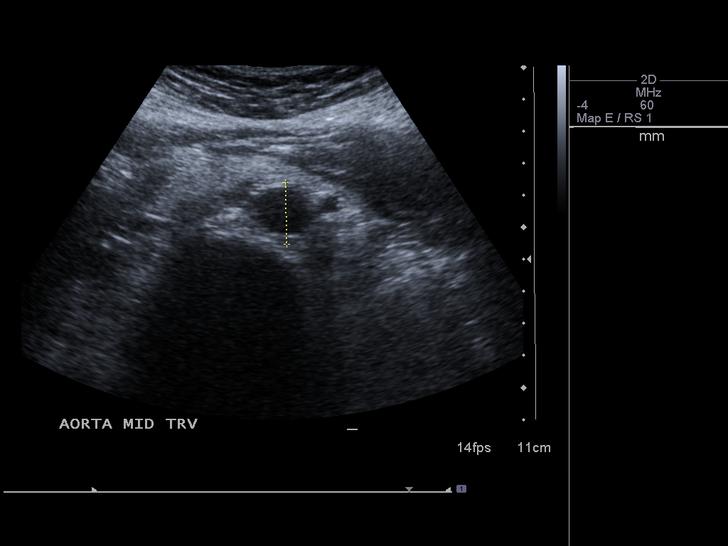
[im 10/24]
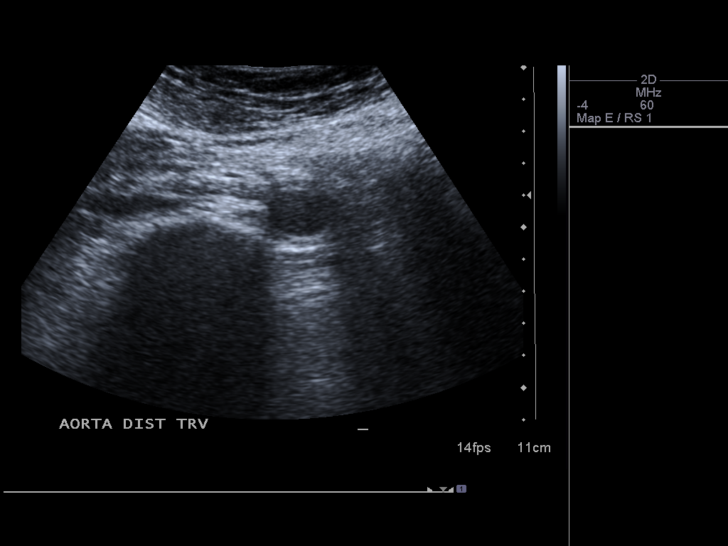
[im 12/24]
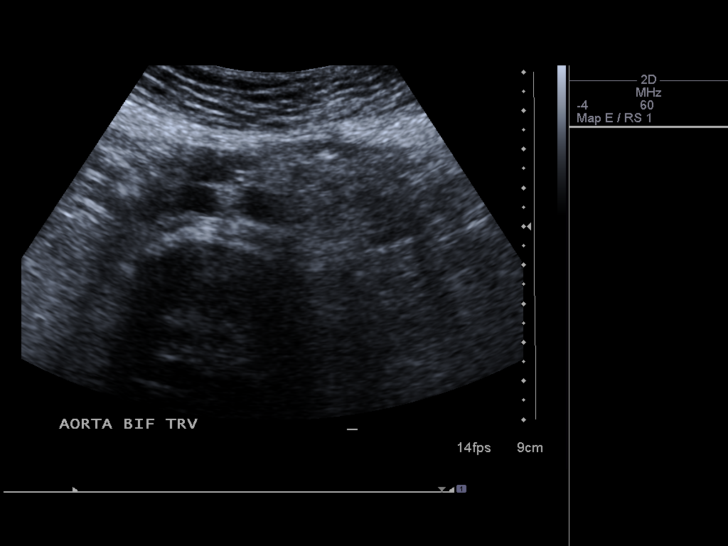
[im 13/24]
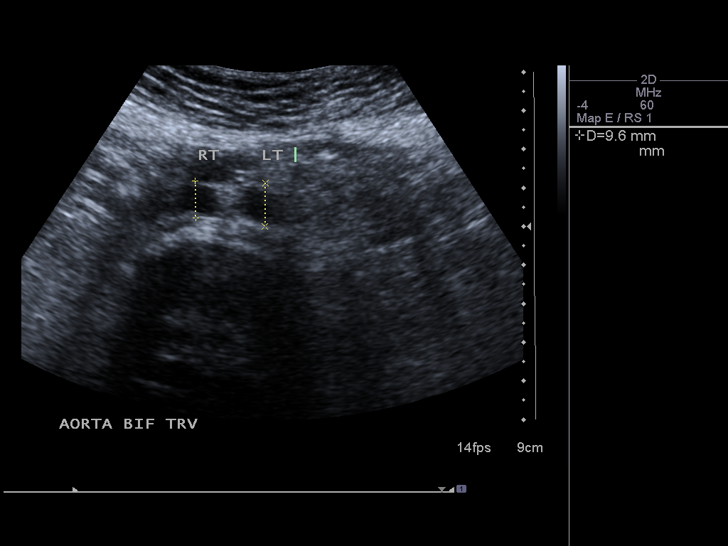
[im 15/24]
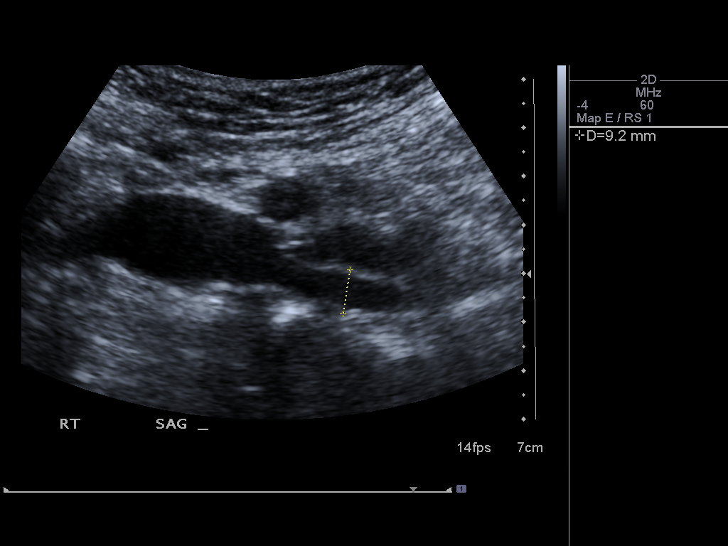
[im 17/24]
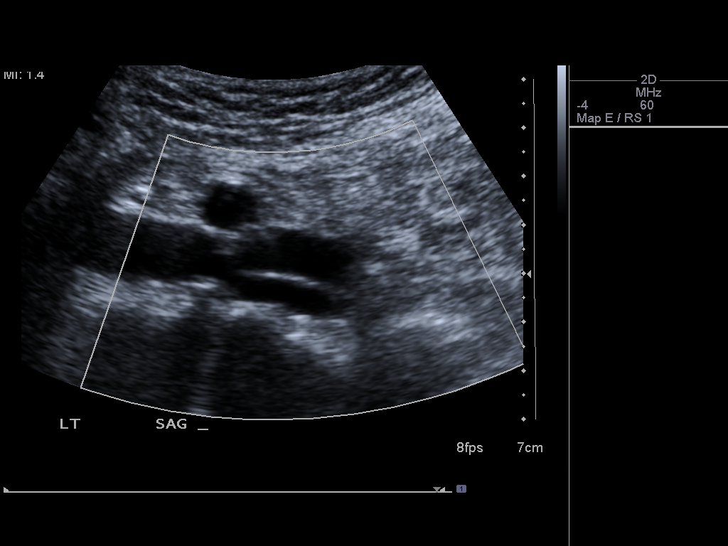
[im 19/24]
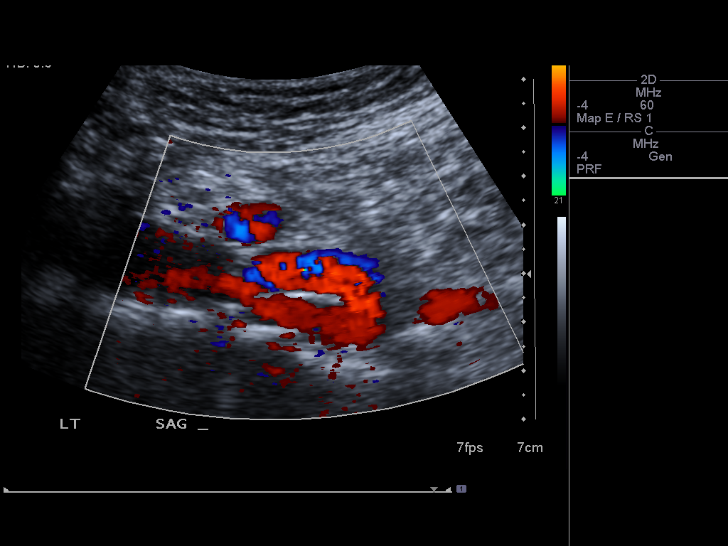
[im 20/24]
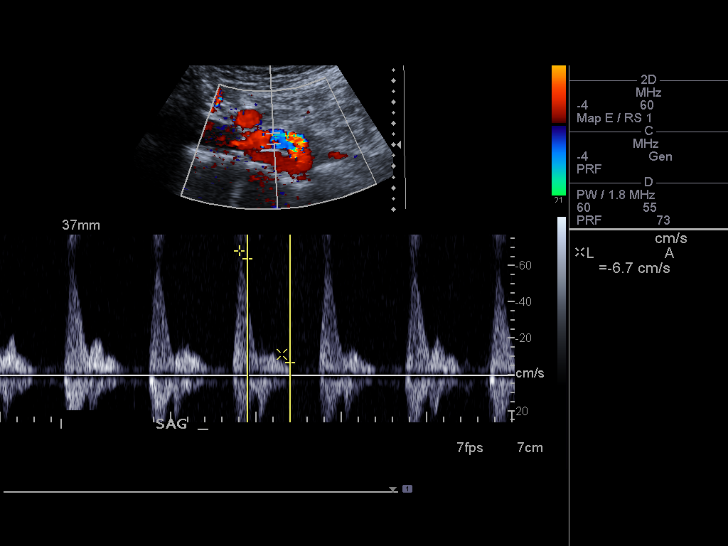
[im 22/24]
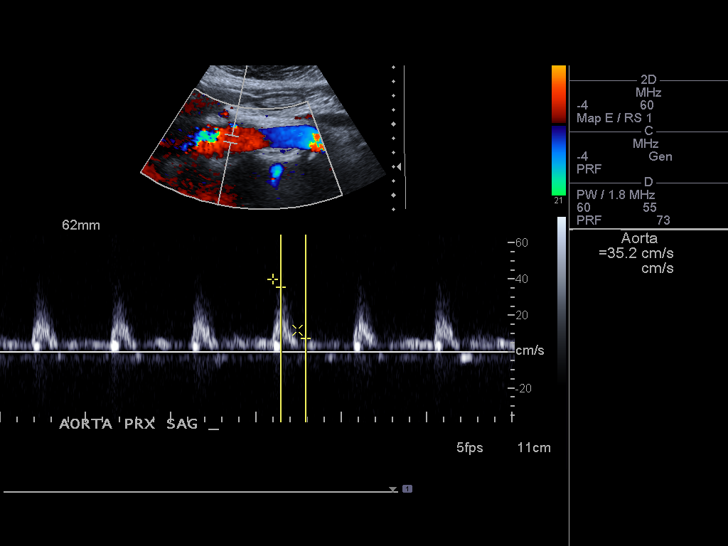
[im 24/24]
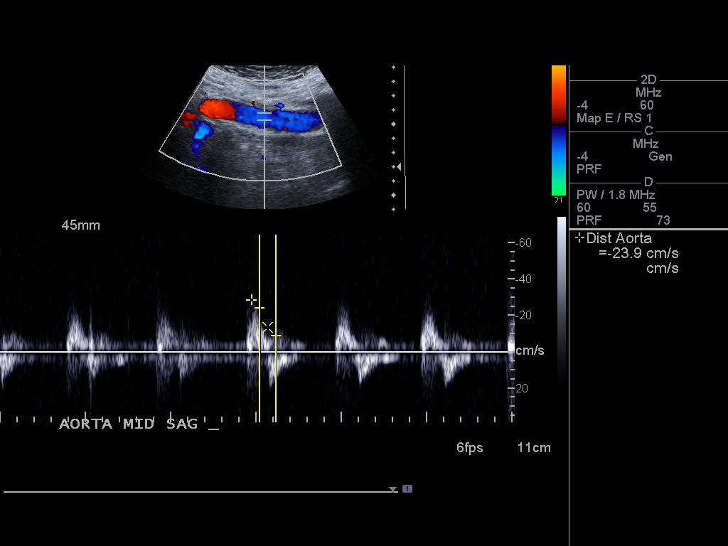

[14 of 24 positions shown; findings below may reference images not displayed]

FINDINGS: Abdominal Aorta

No aneurysm identified.  Diffuse atherosclerotic vascular changes.

Maximum AP

Diameter:  2.1 cm

Maximum TRV

Diameter:
IMPRESSION: No evidence of aneurysm .

## 2016-04-20 ENCOUNTER — Ambulatory Visit (INDEPENDENT_AMBULATORY_CARE_PROVIDER_SITE_OTHER): Payer: Managed Care, Other (non HMO) | Admitting: Podiatry

## 2016-04-20 ENCOUNTER — Ambulatory Visit (INDEPENDENT_AMBULATORY_CARE_PROVIDER_SITE_OTHER): Payer: Managed Care, Other (non HMO)

## 2016-04-20 ENCOUNTER — Encounter: Payer: Self-pay | Admitting: Podiatry

## 2016-04-20 VITALS — BP 141/98 | HR 69 | Ht 66.0 in | Wt 184.0 lb

## 2016-04-20 DIAGNOSIS — M779 Enthesopathy, unspecified: Secondary | ICD-10-CM

## 2016-04-20 DIAGNOSIS — M21621 Bunionette of right foot: Secondary | ICD-10-CM | POA: Diagnosis not present

## 2016-04-20 DIAGNOSIS — M79671 Pain in right foot: Secondary | ICD-10-CM | POA: Diagnosis not present

## 2016-04-20 DIAGNOSIS — M79672 Pain in left foot: Secondary | ICD-10-CM | POA: Diagnosis not present

## 2016-04-20 DIAGNOSIS — M2041 Other hammer toe(s) (acquired), right foot: Secondary | ICD-10-CM | POA: Diagnosis not present

## 2016-04-20 NOTE — Progress Notes (Signed)
   Subjective:    Patient ID: Leah Perkins, female    DOB: [birth year], 60 y.o.   MRN: [id number]  HPI Chief Complaint  Patient presents with  . Foot Pain    Right foot; lateral side; pt stated, "has pain on and off for the past year"  . Toe Pain    Right foot, 2nd toe; pt stated, "hurts to wear certain shoes"      Review of Systems  Allergic/Immunologic: Positive for environmental allergies.  All other systems reviewed and are negative.      Objective:   Physical Exam        Assessment & Plan:

## 2016-04-21 ENCOUNTER — Telehealth: Payer: Self-pay | Admitting: *Deleted

## 2016-04-21 NOTE — Telephone Encounter (Signed)
"  Patient called and stated she is able to do surgery on November 14."  "I had called and left a message to move my surgery from November 21 to November 14.  I saw Dr. Paulla Dolly and he said he could do it on November 14."  He does not have anything available on November 14.  He has a full schedule.  "I just saw him yesterday.  It gets full that quick?"  He did not have anything available then.  I can check with him tomorrow and see what he says.  "Well put me down for the 21st.  If there are any cancellations for the 14th please let me know."  I will let you know.

## 2016-04-21 NOTE — Progress Notes (Signed)
Subjective:     Patient ID: Leah Perkins, female   DOB: 06/04/1956, 60 y.o.   MRN: [id number]  HPI patient presents stating she has very painful digital deformity second right with movement of the hallux against the second toe and a pain in the outside of the right fifth metatarsal which is increasingly making it hard to wear shoe gear. States she's tried padding of this she's tried wider shoes she's tried soft leather and she's tried soaks   Review of Systems  All other systems reviewed and are negative.      Objective:   Physical Exam  Constitutional: She is oriented to person, place, and time.  Cardiovascular: Intact distal pulses.   Musculoskeletal: Normal range of motion.  Neurological: She is oriented to person, place, and time.  Skin: Skin is warm.  Nursing note and vitals reviewed.  neurovascular status found to be intact with muscle strength adequate range of motion within normal limits. Patient's found to have rigid contracture digit 2 right with an under lapping big toe that's causing structural changes this second toe along with tailor's bunion deformity right with redness and pain when palpated. 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 I do think this will require digital fusion of the second toe along with Akin osteotomy and metatarsal osteotomy fifth right. I did explain conservative surgical treatments and she has exhausted all conservative care and wants surgical intervention and will be seen back for consult  X-rays indicate severe rigid contracture digit 2 right with  hallux that is under lapping the second toe and elevation of the 45 intermetatarsal angle

## 2016-04-30 ENCOUNTER — Ambulatory Visit (INDEPENDENT_AMBULATORY_CARE_PROVIDER_SITE_OTHER): Payer: Managed Care, Other (non HMO) | Admitting: Podiatry

## 2016-04-30 DIAGNOSIS — M79671 Pain in right foot: Secondary | ICD-10-CM | POA: Diagnosis not present

## 2016-04-30 DIAGNOSIS — M79672 Pain in left foot: Secondary | ICD-10-CM | POA: Diagnosis not present

## 2016-04-30 DIAGNOSIS — M2041 Other hammer toe(s) (acquired), right foot: Secondary | ICD-10-CM

## 2016-04-30 DIAGNOSIS — M21621 Bunionette of right foot: Secondary | ICD-10-CM

## 2016-04-30 NOTE — Progress Notes (Signed)
Subjective:     Patient ID: Leah Perkins, female   DOB: 01-14-1956, 60 y.o.   MRN: [id number]  HPI patient presents for consult concerning correction of her right foot that has become increasingly painful with shoe gear   Review of Systems     Objective:   Physical Exam Neurovascular status intact with patient noted to have structural deviation the hallux against the second toe right elevation of the second toe elongation of the third toe and prominence with redness around the fifth metatarsal head right foot. 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 Patient wants surgery signed consent form after extensive review and is scheduled for outpatient surgery understanding that this will take approximate 6 months for complete healing and that there is no long-term guarantees. I dispensed air fracture walker for the postoperative period instructed on usage and encouraged her to call with any questions prior to procedure

## 2016-04-30 NOTE — Patient Instructions (Signed)

## 2016-05-05 NOTE — Telephone Encounter (Signed)
I'm calling to let you know Dr. Paulla Dolly had a cancellation for 05/19/2016.  He wanted me to call and see if you would like to reschedule to then.  "Sure, that would be great!"  I will get it rescheduled.  Someone from the surgical center will call you with the arrival time the Friday or Monday before with the arrival time.

## 2016-05-18 ENCOUNTER — Encounter: Payer: Self-pay | Admitting: Sports Medicine

## 2016-05-18 NOTE — Pre-Procedure Instructions (Signed)
Patient called with questions on how to use surgical scrub. I advised patient to use brush this PM or in the AM during shower to make surgical area is thoroughly cleansed. Patient expressed understanding.  -Dr. Cannon Kettle

## 2016-05-19 ENCOUNTER — Encounter: Payer: Self-pay | Admitting: Podiatry

## 2016-05-19 DIAGNOSIS — M2041 Other hammer toe(s) (acquired), right foot: Secondary | ICD-10-CM | POA: Diagnosis not present

## 2016-05-19 DIAGNOSIS — M2011 Hallux valgus (acquired), right foot: Secondary | ICD-10-CM | POA: Diagnosis not present

## 2016-05-19 DIAGNOSIS — M722 Plantar fascial fibromatosis: Secondary | ICD-10-CM | POA: Diagnosis not present

## 2016-05-19 DIAGNOSIS — M21541 Acquired clubfoot, right foot: Secondary | ICD-10-CM | POA: Diagnosis not present

## 2016-05-21 ENCOUNTER — Telehealth: Payer: Self-pay | Admitting: *Deleted

## 2016-05-21 NOTE — Telephone Encounter (Signed)
Pt states she had surgery 05/19/2016 and yesterday was able to go to the bathroom without discomfort and had not taken any Percocet, but today she can hardly make to the bathroom and back. Pt describes the pain as throbbing and just pain.  I told pt the anesthesia had totally worn off, she needed to take her Percocet as directed and not be up on the foot more than 15 minutes/hr.  I told pt I would talk her through a comfort measure that she could do periodically if the dressing felt too tight as indicated by throbbing. I instructed pt to sit, remove the surgical boot, open-ended sock, ace wrap only leave the gauze, elevate the foot 15 minutes, but if pain worsens dangle the foot for 15 minutes, this would be the only time dangling would be helpful.  After the 15 minutes in either of the positions, place the foot level with the hip and rewrap the ace loose starting at the toes and going up the leg, replace the sock and reapply the boot. Pt asked if she could occasionally take her foot out of the boot and I told her yes, as long as she was resting, not going to sleep or walking. I told pt to call with concerns, pt states understanding.

## 2016-05-26 ENCOUNTER — Ambulatory Visit: Payer: 59 | Attending: Gynecologic Oncology | Admitting: Gynecologic Oncology

## 2016-05-26 ENCOUNTER — Encounter: Payer: Self-pay | Admitting: Gynecologic Oncology

## 2016-05-26 ENCOUNTER — Other Ambulatory Visit: Payer: 59

## 2016-05-26 ENCOUNTER — Other Ambulatory Visit: Payer: Self-pay | Admitting: Gynecologic Oncology

## 2016-05-26 VITALS — BP 124/88 | HR 62 | Temp 97.9°F | Resp 18 | Ht 65.0 in | Wt 184.9 lb

## 2016-05-26 DIAGNOSIS — Z9889 Other specified postprocedural states: Secondary | ICD-10-CM | POA: Insufficient documentation

## 2016-05-26 DIAGNOSIS — I10 Essential (primary) hypertension: Secondary | ICD-10-CM | POA: Insufficient documentation

## 2016-05-26 DIAGNOSIS — K5900 Constipation, unspecified: Secondary | ICD-10-CM | POA: Diagnosis not present

## 2016-05-26 DIAGNOSIS — Z8543 Personal history of malignant neoplasm of ovary: Secondary | ICD-10-CM

## 2016-05-26 DIAGNOSIS — Z79899 Other long term (current) drug therapy: Secondary | ICD-10-CM | POA: Diagnosis not present

## 2016-05-26 DIAGNOSIS — Z820 Family history of epilepsy and other diseases of the nervous system: Secondary | ICD-10-CM | POA: Insufficient documentation

## 2016-05-26 DIAGNOSIS — C569 Malignant neoplasm of unspecified ovary: Secondary | ICD-10-CM

## 2016-05-26 DIAGNOSIS — Z809 Family history of malignant neoplasm, unspecified: Secondary | ICD-10-CM | POA: Insufficient documentation

## 2016-05-26 DIAGNOSIS — Z9221 Personal history of antineoplastic chemotherapy: Secondary | ICD-10-CM | POA: Diagnosis not present

## 2016-05-26 DIAGNOSIS — Z8249 Family history of ischemic heart disease and other diseases of the circulatory system: Secondary | ICD-10-CM | POA: Insufficient documentation

## 2016-05-26 NOTE — Progress Notes (Signed)
Consult Note: Gyn-Onc  Leah Perkins 60 y.o. female  CC:  Chief Complaint  Patient presents with  . 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 At that time she underwent optimal debulking and was treated with 6 cycles of dose dense paclitaxel and carboplatin. She completed her therapy in September 2013. CA 125 initially was 99 and prior to her move from [place] it nadired at 3. She had also undergone genetic testing and is BRCA negative. Her gynecologic oncologist in [place] referred her to [place] for followup. He did discuss with her that the recurrence of a woman with stage II cancer would be approximately 30-40% and a discuss options regarding consolidation but opted for close surveillance. She did very well from her chemotherapy and had no significant issues had manageable neutropenia and no significant neuropathy in her performance status was 0. She also has a family history significant for hypertrophic cardiomyopathy she has been tested and does not have the gene.   She had a colonoscopy in September 2009 that was negative. I last saw her in May 2017 at which time her CA-125 was 4.  Interval History:  She's overall doing well. She had surgery on her right foot last week and has follow-up tomorrow. She has to wear a boot for 2 more weeks. She is taking some pain medications and has had a little bit of constipation as result that she's otherwise been doing very well. She saw her primary care physician yesterday who did a full blood panel and exam and everything was unremarkable. She also received her flu shot. Her mammogram is up-to-date. She and her husband enjoyed a very wonderful vacation to [place] on a cruise. The planning and other [place] cruise for the early part of January. There are no new medical problems and her family.  Review of  Systems: Constitutional: Denies fevers Skin: No rash Cardiovascular: No chest pain, shortness of breath Pulmonary: No cough Gastro Intestinal:  No nausea, vomiting, slight constipation.  No bloating or early satiety. Genitourinary: No pelvic pressure or urgency.  Denies vaginal bleeding and discharge.  Musculoskeletal: + right foot discomfort Neurologic: No weakness, numbness, or change in gait.  Psychology: No changes  Current Meds:  Outpatient Encounter Prescriptions as of 05/26/2016  Medication Sig Dispense Refill  . atenolol (TENORMIN) 25 MG tablet Take 25 mg by mouth daily.    . Calcium Carb-Cholecalciferol (CALCIUM 600 + D PO) Take 1 tablet by mouth 2 (two) times daily.    . cholecalciferol (VITAMIN D) 1000 UNITS tablet Take 1,000 Units by mouth daily.    . Multiple Vitamins-Minerals (MULTIVITAMIN WITH MINERALS) tablet Take 1 tablet by mouth daily.    . vitamin C (ASCORBIC ACID) 500 MG tablet Take 500 mg by mouth 2 (two) times daily.     No facility-administered encounter medications on file as of 05/26/2016.     Allergy:  Allergies  Allergen Reactions  . Sulfa Antibiotics     "does not remember"    Social Hx:   Social History   Social History  . Marital status: Married    Spouse name: N/A  . Number of children: N/A  . Years of education: N/A   Occupational History  . Not on file.   Social History Main Topics  . Smoking status: Never Smoker  .  Smokeless tobacco: Never Used  . Alcohol use Yes     Comment: Socially  . Drug use: No  . Sexual activity: Yes   Other Topics Concern  . Not on file   Social History Narrative  . No narrative on file    Past Surgical Hx:  Past Surgical History:  Procedure Laterality Date  . ABDOMINAL HYSTERECTOMY  3/13 TAHBSO optimal tumor debulking  . APPENDECTOMY    . Arthoscopy Right    Knee    Past Medical Hx:  Past Medical History:  Diagnosis Date  . Hypertension   . Ovarian cancer ([place]) 09/2011   IIC serous ca  s/p 6 cycles dose dense carbo/taxol last chemo 9/13    Family Hx:  Family History  Problem Relation Age of Onset  . Hypertension Mother   . Hypertrophic cardiomyopathy Mother     pt negativ rofr gene 08/05/12  . Alzheimer's disease Father   . Cancer Brother   . Alzheimer's disease Paternal Grandmother   . Hypertrophic cardiomyopathy Son     Vitals:  Blood pressure 124/88, pulse 62, temperature 97.9 F (36.6 C), temperature source Oral, resp. rate 18, height '5\' 5"'$  (1.651 m), weight 184 lb 14.4 oz (83.9 kg), SpO2 99 %.  Physical Exam: Well-nourished well-developed female in no acute distress.  Neck: Supple, no lymphadenopathy no thyromegaly.  Lungs: Clear to auscultation bilaterally.   Cardiac: Regular rate rhythm.  Abdomen: Shows well-healed vertical midline incision. There is no evidence of an incisional hernia. Abdomen is soft, nontender, nondistended. There is no palpable mass or hepatomsplenomegaly. There is no fluid wave.  Groins: There's no lymphadenopathy.  Extremities: There is no edema. + surgical stocking on right foot and leg  Pelvic: Normal external female genitalia. Bimanual examination reveals no masses or nodularity rectal confirms.  Assessment/Plan:  60 year old with stage IIc serous ovarian carcinoma diagnosed and treated in 2013. She was initially diagnosed in March of 2013 and she completed her therapy in September of 2013 without significant sequela. CA 125 is informative for her as it was elevated at the time of her diagnosis. She had genetic testing and is negative for BRCA gene mutation. She also had genetic testing for hypertrophic cardiomyopathy and that was similarly negative. We'll followup in results of her CA 125. We will notify her of the results.  She will follow up with her other physicians as scheduled and will return to see [place] in 6 months.     Leah Winbush A., MD 05/26/2016, 11:07 AM

## 2016-05-26 NOTE — Patient Instructions (Signed)
We will contact you with the results of your CA 125 from today.  Plan to follow up in six months or sooner if needed.  Please call for any questions or concerns.

## 2016-05-27 ENCOUNTER — Encounter: Payer: Self-pay | Admitting: Podiatry

## 2016-05-27 ENCOUNTER — Ambulatory Visit (INDEPENDENT_AMBULATORY_CARE_PROVIDER_SITE_OTHER): Payer: Managed Care, Other (non HMO) | Admitting: Podiatry

## 2016-05-27 ENCOUNTER — Ambulatory Visit (INDEPENDENT_AMBULATORY_CARE_PROVIDER_SITE_OTHER): Payer: Managed Care, Other (non HMO)

## 2016-05-27 VITALS — Temp 98.2°F

## 2016-05-27 DIAGNOSIS — Z9889 Other specified postprocedural states: Secondary | ICD-10-CM

## 2016-05-27 DIAGNOSIS — M2041 Other hammer toe(s) (acquired), right foot: Secondary | ICD-10-CM

## 2016-05-27 DIAGNOSIS — M21621 Bunionette of right foot: Secondary | ICD-10-CM

## 2016-05-27 LAB — CANCER ANTIGEN 125 (PARALLEL TESTING): CA 125: 5 U/mL (ref <35)

## 2016-05-27 LAB — CA 125: CANCER ANTIGEN (CA) 125: 14.1 U/mL (ref 0.0–38.1)

## 2016-05-28 ENCOUNTER — Telehealth: Payer: Self-pay

## 2016-05-28 NOTE — Progress Notes (Signed)
Subjective:     Patient ID: Leah Perkins, female   DOB: 10-Feb-1956, 60 y.o.   MRN: [id number]  HPI patient states doing very well with the right foot with minimal discomfort or swelling   Review of Systems     Objective:   Physical Exam Neurovascular status intact negative Homans sign noted with well-healing surgical sites right hallux second and third digits and fifth metatarsal. Pins are in place second and third digits right and there is no drainage noted or erythema    Assessment:     Doing well post surgery right foot with good alignment pins in place and good structural correction    Plan:     H&P x-rays reviewed and explained the importance of continued immobilization. Reapplied sterile dressing gave instructions on continued elevation and patient will be seen back if any changes were to occur or if any drainage redness or swelling was noticed. Patient will be seen back 2 weeks for suture removal or earlier if needed  X-ray report indicated the pins are in place there may be slight movement of the fifth metatarsal and there could be some stress against the hallux site where the Akin osteotomy was performed so continued immobilization with no weightbearing was encouraged

## 2016-05-28 NOTE — Telephone Encounter (Signed)
Orders received from Leah Perkins to contact the patient with her CA 125 level results being "WNL" at   (14.1 ). Patient contacted and updated , patient states understanding , denies further questions at this time.

## 2016-06-03 ENCOUNTER — Ambulatory Visit: Payer: 59 | Admitting: Gynecologic Oncology

## 2016-06-10 ENCOUNTER — Ambulatory Visit (INDEPENDENT_AMBULATORY_CARE_PROVIDER_SITE_OTHER): Payer: Managed Care, Other (non HMO)

## 2016-06-10 ENCOUNTER — Ambulatory Visit (INDEPENDENT_AMBULATORY_CARE_PROVIDER_SITE_OTHER): Payer: Managed Care, Other (non HMO) | Admitting: Podiatry

## 2016-06-10 DIAGNOSIS — M2041 Other hammer toe(s) (acquired), right foot: Secondary | ICD-10-CM | POA: Diagnosis not present

## 2016-06-10 DIAGNOSIS — M21621 Bunionette of right foot: Secondary | ICD-10-CM

## 2016-06-10 DIAGNOSIS — Z9889 Other specified postprocedural states: Secondary | ICD-10-CM

## 2016-06-11 NOTE — Progress Notes (Signed)
Subjective:     Patient ID: Leah Perkins, female   DOB: [birth year], 60 y.o.   MRN: [id number]  HPI patient presents stating that her right foot is doing well with mild edema and ready to get her stitches out   Review of Systems     Objective:   Physical Exam Neurovascular status intact negative Homans sign noted with wound edges well coapted digits and good alignment and slightly elevated with good alignment of the right hallux and fifth metatarsal with no current redness noted of the hallux or no drainage    Assessment:     Doing well post forefoot reconstruction right    Plan:     Stitches were removed and gave instructions on lowering the second and third toes and did dispense a BioSkin brace to properly lower the second and third digits with Velcro type extensions. Instructed on how to do this properly and placed them in a plantarflexed position and reviewed x-rays with her. She'll be seen back in 2 weeks or earlier if needed  X-ray report indicates that there is good alignment second and third toes screw in place wire in place with no indications of excessive motion

## 2016-06-19 ENCOUNTER — Encounter: Payer: Self-pay | Admitting: Podiatry

## 2016-06-24 ENCOUNTER — Ambulatory Visit (INDEPENDENT_AMBULATORY_CARE_PROVIDER_SITE_OTHER): Payer: Managed Care, Other (non HMO)

## 2016-06-24 ENCOUNTER — Ambulatory Visit (INDEPENDENT_AMBULATORY_CARE_PROVIDER_SITE_OTHER): Payer: Managed Care, Other (non HMO) | Admitting: Podiatry

## 2016-06-24 ENCOUNTER — Encounter: Payer: Self-pay | Admitting: Podiatry

## 2016-06-24 VITALS — Resp 16

## 2016-06-24 DIAGNOSIS — M2041 Other hammer toe(s) (acquired), right foot: Secondary | ICD-10-CM | POA: Diagnosis not present

## 2016-06-24 DIAGNOSIS — Z9889 Other specified postprocedural states: Secondary | ICD-10-CM | POA: Diagnosis not present

## 2016-06-24 DIAGNOSIS — M21621 Bunionette of right foot: Secondary | ICD-10-CM

## 2016-06-24 NOTE — Progress Notes (Signed)
Subjective:     Patient ID: Leah Perkins, female   DOB: 1956-02-21, 60 y.o.   MRN: [id number]  HPI patient presents for pin removals and states that she's doing well with minimal edema or other pathology noted   Review of Systems     Objective:   Physical Exam Neurovascular status intact with patient's right foot doing well with pins intact wound edges well coapted and mild edema noted    Assessment:     Doing well post digital fusions met osteotomy and Akin osteotomy    Plan:     H&P x-rays reviewed. I discussed continued immobilization for another week just to be safe and I did discuss that we will continue to watch the fifth metatarsal but I do believe in the and will heal uneventfully but we'll need to be watched. Pins removed the second and third digits and he did well and sterile dressings applied and reappoint to recheck again in approximate 4 weeks or earlier if needed  X-ray report indicated that there is good alignment of the digits well healed Akin osteotomy site and moderate healing that occurring fifth metatarsal right but it appears to be stable and has not changed. I discussed this area and I do not think it'll require revisional surgery but we'll need to be watched and hopefully will heal uneventfully in the future

## 2016-06-26 ENCOUNTER — Other Ambulatory Visit: Payer: Self-pay | Admitting: Gynecologic Oncology

## 2016-06-26 DIAGNOSIS — Z1231 Encounter for screening mammogram for malignant neoplasm of breast: Secondary | ICD-10-CM

## 2016-06-29 NOTE — Progress Notes (Signed)
DOS 11.07.2017 Aiken Osteotomy with Wire Big Toe Right; Met Osteotomy with Screw 5th Right; Fusion with Pin 2nd Toe Right, 3rd Toe Right

## 2016-07-01 ENCOUNTER — Ambulatory Visit: Payer: 59 | Admitting: Gynecologic Oncology

## 2016-07-20 ENCOUNTER — Encounter: Payer: Self-pay | Admitting: Podiatry

## 2016-07-27 ENCOUNTER — Ambulatory Visit (INDEPENDENT_AMBULATORY_CARE_PROVIDER_SITE_OTHER): Payer: Self-pay | Admitting: Podiatry

## 2016-07-27 ENCOUNTER — Ambulatory Visit (INDEPENDENT_AMBULATORY_CARE_PROVIDER_SITE_OTHER): Payer: Managed Care, Other (non HMO)

## 2016-07-27 DIAGNOSIS — M21621 Bunionette of right foot: Secondary | ICD-10-CM | POA: Diagnosis not present

## 2016-07-27 DIAGNOSIS — Z9889 Other specified postprocedural states: Secondary | ICD-10-CM

## 2016-07-27 DIAGNOSIS — M2041 Other hammer toe(s) (acquired), right foot: Secondary | ICD-10-CM | POA: Diagnosis not present

## 2016-07-29 ENCOUNTER — Ambulatory Visit: Payer: 59

## 2016-08-02 NOTE — Progress Notes (Signed)
Subjective:     Patient ID: Leah Perkins, female   DOB: 02-Mar-1956, 61 y.o.   MRN: [id number]  HPI patient presents stating that she's doing well and here for stitch removal   Review of Systems     Objective:   Physical Exam Neurovascular status intact stabilization of digits noted with good alignment and wound edges well coapted with no movement    Assessment:     Doing well post surgery    Plan:     Advised on elevation anti-inflammatories and continued dressing usage. Patient had stitches removed wound or drink coapted well reviewed x-rays and gradual return to activity at this time  X-ray report indicated good healing osteotomies with good alignment noted

## 2016-08-07 ENCOUNTER — Ambulatory Visit
Admission: RE | Admit: 2016-08-07 | Discharge: 2016-08-07 | Disposition: A | Discharge status: Home / Self Care | Payer: 59 | Source: Ambulatory Visit | Attending: Gynecologic Oncology | Admitting: Gynecologic Oncology

## 2016-08-07 DIAGNOSIS — Z1231 Encounter for screening mammogram for malignant neoplasm of breast: Secondary | ICD-10-CM

## 2016-11-03 NOTE — Progress Notes (Signed)
Consult Note: Gyn-Onc  Leah Perkins 61 y.o. female  CC:  Chief Complaint  Patient presents with  . 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 At that time she underwent optimal debulking and was treated with 6 cycles of dose dense paclitaxel and carboplatin. She completed her therapy in September 2013. CA 125 initially was 99 and prior to her move from [place] it nadired at 3. She had also undergone genetic testing and is BRCA negative. Her gynecologic oncologist in [place] referred her to [place] for followup. He did discuss with her that the recurrence of a woman with stage II cancer would be approximately 30-40% and a discuss options regarding consolidation but opted for close surveillance. She did very well from her chemotherapy and had no significant issues had manageable neutropenia and no significant neuropathy in her performance status was 0. She also has a family history significant for hypertrophic cardiomyopathy she has been tested and does not have the gene.   She had a colonoscopy in September 2009 that was negative. I last saw her in November  2017 at which time her CA-125 was 5.  Interval History:  She's overall doing well. She offers really no complaints at all. Her husband was diagnosed with CLL felt to be secondary to toxic exposures from the [facility] and [facility]. Her mammograms are up-to-date. She had one in January and she has them yearly. She and her husband are going on a trip to the [place] for a cruise. She's exercising fairly regularly and really is feeling very well. She is very pleased this is her 5 year anniversary. When discussing follow-up she would like to follow-up with [place] once a year to do her pelvic examinations as well as check her CA-125.  Review of Systems: Constitutional: Denies fevers Skin: No rash Cardiovascular: No chest pain,  shortness of breath Pulmonary: No cough Gastro Intestinal:  No nausea, vomiting, slight constipation.  No bloating or early satiety. Genitourinary: No pelvic pressure or urgency.  Denies vaginal bleeding and discharge.  Musculoskeletal: No complaints Neurologic: No weakness, numbness, or change in gait.  Psychology: No changes  Current Meds:  Outpatient Encounter Prescriptions as of 11/04/2016  Medication Sig  . atenolol (TENORMIN) 25 MG tablet Take 25 mg by mouth daily.  . Calcium Carb-Cholecalciferol (CALCIUM 600 + D PO) Take 1 tablet by mouth 2 (two) times daily.  . cholecalciferol (VITAMIN D) 1000 UNITS tablet Take 1,000 Units by mouth daily.  . Multiple Vitamins-Minerals (MULTIVITAMIN WITH MINERALS) tablet Take 1 tablet by mouth daily.  Marland Kitchen oxyCODONE-acetaminophen (PERCOCET) 10-325 MG tablet Take 1 tablet by mouth every 4 (four) hours as needed for pain.  . promethazine (PHENERGAN) 25 MG tablet Take 25 mg by mouth every 6 (six) hours as needed for nausea or vomiting.  . vitamin C (ASCORBIC ACID) 500 MG tablet Take 500 mg by mouth 2 (two) times daily.   No facility-administered encounter medications on file as of 11/04/2016.     Allergy:  Allergies  Allergen Reactions  . Sulfa Antibiotics     "does not remember"    Social Hx:   Social History   Social History  . Marital status: Married    Spouse name: N/A  . Number of children: N/A  . Years of education: N/A   Occupational History  . Not on file.  Social History Main Topics  . Smoking status: Never Smoker  . Smokeless tobacco: Never Used  . Alcohol use Yes     Comment: Socially  . Drug use: No  . Sexual activity: Yes   Other Topics Concern  . Not on file   Social History Narrative  . No narrative on file    Past Surgical Hx:  Past Surgical History:  Procedure Laterality Date  . ABDOMINAL HYSTERECTOMY  3/13 TAHBSO optimal tumor debulking  . APPENDECTOMY    . Arthoscopy Right    Knee    Past Medical Hx:   Past Medical History:  Diagnosis Date  . Hypertension   . Ovarian cancer ([place]) 09/2011   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 9/13    Family Hx:  Family History  Problem Relation Age of Onset  . Hypertension Mother   . Hypertrophic cardiomyopathy Mother     pt negativ rofr gene 08/05/12  . Alzheimer's disease Father   . Cancer Brother   . Alzheimer's disease Paternal Grandmother   . Hypertrophic cardiomyopathy Son     Vitals:  There were no vitals taken for this visit. Please refer to Epic  Physical Exam: Well-nourished well-developed female in no acute distress.  Neck: Supple, no lymphadenopathy no thyromegaly.  Lungs: Clear to auscultation bilaterally.   Cardiac: Regular rate rhythm.  Abdomen: Shows well-healed vertical midline incision. There is no evidence of an incisional hernia. Abdomen is soft, nontender, nondistended. There is no palpable mass or hepatomsplenomegaly. There is no fluid wave.  Groins: There's no lymphadenopathy.  Extremities: There is no edema.   Pelvic: Normal external female genitalia. Bimanual examination reveals no masses or nodularity rectal confirms.  Assessment/Plan:  61 year old with stage IIc serous ovarian carcinoma diagnosed and treated in 2013. She was initially diagnosed in March of 2013 and she completed her therapy in September of 2013 without significant sequela. CA 125 is informative for her as it was elevated at the time of her diagnosis. She had genetic testing and is negative for BRCA gene mutation. She also had genetic testing for hypertrophic cardiomyopathy and that was similarly negative. We'll followup in results of her CA 125. We will notify her of the results.  She will follow up with her other physicians as scheduled and can return to see [place] in 12 months, which is her request as her primary care physician does not do GYN exams. I'm comfortable following her on a yearly basis and performing her CA-125.     [name], MD 11/04/2016, 12:38 PM

## 2016-11-04 ENCOUNTER — Other Ambulatory Visit (HOSPITAL_BASED_OUTPATIENT_CLINIC_OR_DEPARTMENT_OTHER): Payer: 59

## 2016-11-04 ENCOUNTER — Ambulatory Visit: Payer: Managed Care, Other (non HMO) | Attending: Gynecologic Oncology | Admitting: Gynecologic Oncology

## 2016-11-04 ENCOUNTER — Encounter: Payer: Self-pay | Admitting: Gynecologic Oncology

## 2016-11-04 ENCOUNTER — Other Ambulatory Visit: Payer: Self-pay | Admitting: Gynecologic Oncology

## 2016-11-04 DIAGNOSIS — Z79899 Other long term (current) drug therapy: Secondary | ICD-10-CM | POA: Insufficient documentation

## 2016-11-04 DIAGNOSIS — Z9889 Other specified postprocedural states: Secondary | ICD-10-CM | POA: Insufficient documentation

## 2016-11-04 DIAGNOSIS — C569 Malignant neoplasm of unspecified ovary: Secondary | ICD-10-CM

## 2016-11-04 DIAGNOSIS — Z9221 Personal history of antineoplastic chemotherapy: Secondary | ICD-10-CM | POA: Diagnosis not present

## 2016-11-04 DIAGNOSIS — Z888 Allergy status to other drugs, medicaments and biological substances status: Secondary | ICD-10-CM | POA: Insufficient documentation

## 2016-11-04 DIAGNOSIS — Z8543 Personal history of malignant neoplasm of ovary: Secondary | ICD-10-CM

## 2016-11-04 DIAGNOSIS — I10 Essential (primary) hypertension: Secondary | ICD-10-CM | POA: Diagnosis not present

## 2016-11-04 DIAGNOSIS — Z820 Family history of epilepsy and other diseases of the nervous system: Secondary | ICD-10-CM | POA: Insufficient documentation

## 2016-11-04 DIAGNOSIS — Z809 Family history of malignant neoplasm, unspecified: Secondary | ICD-10-CM | POA: Insufficient documentation

## 2016-11-04 DIAGNOSIS — Z8249 Family history of ischemic heart disease and other diseases of the circulatory system: Secondary | ICD-10-CM | POA: Insufficient documentation

## 2016-11-04 DIAGNOSIS — Z1371 Encounter for nonprocreative screening for genetic disease carrier status: Secondary | ICD-10-CM

## 2016-11-04 NOTE — Patient Instructions (Signed)
Happy 5 year anniversary!. I will notify you of the results of your bloodwork from today. Please return to see [place] in one year.

## 2016-11-05 LAB — CA 125: Cancer Antigen (CA) 125: 11.5 U/mL (ref 0.0–38.1)

## 2016-11-06 ENCOUNTER — Telehealth: Payer: Self-pay

## 2016-11-06 NOTE — Telephone Encounter (Signed)
lvm that ca 125 was 11.5, WNL. Per Dr Alycia Rossetti.

## 2016-11-25 ENCOUNTER — Ambulatory Visit: Payer: 59 | Admitting: Gynecologic Oncology

## 2017-06-25 ENCOUNTER — Other Ambulatory Visit: Payer: Self-pay | Admitting: Gynecologic Oncology

## 2017-06-25 DIAGNOSIS — Z139 Encounter for screening, unspecified: Secondary | ICD-10-CM

## 2017-08-06 ENCOUNTER — Telehealth: Payer: Self-pay | Admitting: *Deleted

## 2017-08-06 NOTE — Telephone Encounter (Signed)
Returned the patient's call and left a message to call the office back 

## 2017-08-09 ENCOUNTER — Ambulatory Visit
Admission: RE | Admit: 2017-08-09 | Discharge: 2017-08-09 | Disposition: A | Discharge status: Home / Self Care | Payer: Managed Care, Other (non HMO) | Source: Ambulatory Visit | Attending: Gynecologic Oncology | Admitting: Gynecologic Oncology

## 2017-08-09 DIAGNOSIS — Z139 Encounter for screening, unspecified: Secondary | ICD-10-CM

## 2017-08-13 ENCOUNTER — Telehealth: Payer: Self-pay | Admitting: *Deleted

## 2017-08-13 NOTE — Telephone Encounter (Signed)
Patient called regarding an appt for the end of March. Patient advised that Dr. Alycia Rossetti will no longer be in this office, only at [facility]. The patient stated that she will follow up with her PCP and regular GYN.

## 2017-09-13 ENCOUNTER — Telehealth: Payer: Self-pay | Admitting: *Deleted

## 2017-09-13 NOTE — Telephone Encounter (Signed)
Returned the patient's call and scheduled her appts. Patient scheduled for labs and follow up appt on May 3rd starting at 2pm. Patient to arrive at 1:45pm.

## 2017-10-06 ENCOUNTER — Telehealth: Payer: Self-pay | Admitting: *Deleted

## 2017-10-06 NOTE — Telephone Encounter (Signed)
Called and moved the patient's appt from May 3rd with Dr. Denman George to May 6th with Dr. Gerarda Fraction

## 2017-11-12 ENCOUNTER — Other Ambulatory Visit: Payer: Self-pay | Admitting: Gynecologic Oncology

## 2017-11-12 ENCOUNTER — Other Ambulatory Visit: Payer: 59

## 2017-11-12 ENCOUNTER — Ambulatory Visit: Payer: 59 | Admitting: Gynecologic Oncology

## 2017-11-12 DIAGNOSIS — C569 Malignant neoplasm of unspecified ovary: Secondary | ICD-10-CM

## 2017-11-15 ENCOUNTER — Inpatient Hospital Stay: Payer: Managed Care, Other (non HMO) | Attending: Gynecologic Oncology | Admitting: Obstetrics

## 2017-11-15 ENCOUNTER — Telehealth: Payer: Self-pay | Admitting: Oncology

## 2017-11-15 ENCOUNTER — Inpatient Hospital Stay: Payer: Managed Care, Other (non HMO)

## 2017-11-15 ENCOUNTER — Encounter: Payer: Self-pay | Admitting: Obstetrics

## 2017-11-15 VITALS — BP 147/95 | HR 57 | Temp 98.0°F | Resp 20 | Ht 66.0 in | Wt 173.7 lb

## 2017-11-15 DIAGNOSIS — C569 Malignant neoplasm of unspecified ovary: Secondary | ICD-10-CM

## 2017-11-15 DIAGNOSIS — Z9221 Personal history of antineoplastic chemotherapy: Secondary | ICD-10-CM | POA: Insufficient documentation

## 2017-11-15 DIAGNOSIS — I10 Essential (primary) hypertension: Secondary | ICD-10-CM | POA: Diagnosis not present

## 2017-11-15 DIAGNOSIS — Z8543 Personal history of malignant neoplasm of ovary: Secondary | ICD-10-CM | POA: Diagnosis present

## 2017-11-15 NOTE — Telephone Encounter (Signed)
Called patient and asked if she has a copy of her genetic testing that was done in [place].  She said she kept all the paperwork she was given and she will check when she gets home today and will call us back.

## 2017-11-15 NOTE — Patient Instructions (Signed)
1. Call for followup pelvic and CA125 in one year. 2. We will let you know your CA125 result from today. 3. We will try to confirm if your genetic testing is up-to-date.

## 2017-11-15 NOTE — Progress Notes (Signed)
Progress Note: Gyn-Onc  Initially referred by Dr. Crawford Givens after completing primary treatment for Stage 2C ovarian CA 2013.   CC:  Chief Complaint  Patient presents with  . Malignant neoplasm of ovary, unspecified laterality [facility])    HPI: Ms. Leah Perkins  is a very nice 62 y.o. P3  Although she is 78 years NED Dr. Alycia Rossetti had discussed follow-up with [place] once a year to do her pelvic examinations as well as check her CA-125.   Marland Kitchen Interval History:  Since her last visit with Dr. Alycia Rossetti 11/04/2016 she has had no new issues.  She specifically denies pelvic pain denies change in appetite denies bloating.  She has no new past medical past surgical or family history.  . 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 At that time she underwent optimal debulking and was treated with 6 cycles of dose dense paclitaxel and carboplatin. She completed her therapy in September 2013.   CA 125 initially was 99 and prior to her move from [place] it nadired at 3.   She had also undergone genetic testing and is BRCA negative.   Her gynecologic oncologist in [place] referred her to [place] for followup. He did discuss with her that the recurrence of a woman with stage II cancer would be approximately 30-40% and a discuss options regarding consolidation but opted for close surveillance. She did very well from her chemotherapy and had no significant issues had manageable neutropenia and no significant neuropathy in her performance status was 0.   ~~Possibly relevant is her use of genital application of talcum powder when she cleansed herself~~  Measurement of disease:  CA125  Preoperative = 99  Nadir = 3  Lab Results  Component Value Date   CA125 5 05/26/2016   CA125 4 11/27/2015   CA125 4 06/12/2015   CA125 5 11/28/2014     Radiology: . Nothing relevant in the [facility] system    Oncologic History:      Ovarian cancer f/u   09/23/2011 Initial  Diagnosis    IIC serous ovarian cancer treated in [place]       - 03/21/2012 Chemotherapy    s/p 6 cycles of dose dense with paclitaxel and carboplatin       The following portions of the patient's history were reviewed and updated as appropriate: allergies, current medications, past family history, past medical history, past social history, past surgical history and problem list.   Current Meds:  Outpatient Encounter Medications as of 11/15/2017  Medication Sig  . atenolol (TENORMIN) 25 MG tablet Take 25 mg by mouth daily.  . Calcium Carb-Cholecalciferol (CALCIUM 600 + D PO) Take 1 tablet by mouth 2 (two) times daily.  . Multiple Vitamins-Minerals (MULTIVITAMIN WITH MINERALS) tablet Take 1 tablet by mouth daily.  . vitamin C (ASCORBIC ACID) 500 MG tablet Take 500 mg by mouth 2 (two) times daily.  . [DISCONTINUED] cholecalciferol (VITAMIN D) 1000 UNITS tablet Take 1,000 Units by mouth daily.  . [DISCONTINUED] oxyCODONE-acetaminophen (PERCOCET) 10-325 MG tablet Take 1 tablet by mouth every 4 (four) hours as needed for pain.  . [DISCONTINUED] promethazine (PHENERGAN) 25 MG tablet Take 25 mg by mouth every 6 (six) hours as needed for nausea or vomiting.   No facility-administered encounter medications on file as of 11/15/2017.     Allergy:  Allergies  Allergen Reactions  . Sulfa Antibiotics     "does not remember"    Social Hx:  Social History   Socioeconomic History  . Marital status: Married    Spouse name: Not on file  . Number of children: Not on file  . Years of education: Not on file  . Highest education level: Not on file  Occupational History  . Not on file  Social Needs  . Financial resource strain: Not on file  . Food insecurity:    Worry: Not on file    Inability: Not on file  . Transportation needs:    Medical: Not on file    Non-medical: Not on file  Tobacco Use  . Smoking status: Never Smoker  . Smokeless tobacco: Never Used  Substance and Sexual Activity  .  Alcohol use: Yes    Comment: Socially ~1 / week  . Drug use: No  . Sexual activity: Yes  Lifestyle  . Physical activity:    Days per week: Not on file    Minutes per session: Not on file  . Stress: Not on file  Relationships  . Social connections:    Talks on phone: Not on file    Gets together: Not on file    Attends religious service: Not on file    Active member of club or organization: Not on file    Attends meetings of clubs or organizations: Not on file    Relationship status: Not on file  . Intimate partner violence:    Fear of current or ex partner: Not on file    Emotionally abused: Not on file    Physically abused: Not on file    Forced sexual activity: Not on file  Other Topics Concern  . Not on file  Social History Narrative  . Not on file    Past Surgical Hx:  Past Surgical History:  Procedure Laterality Date  . ABDOMINAL HYSTERECTOMY  09/2011   TAHBSO with appendectomy and optimal tumor debulking  . Arthoscopy Right    Knee  . HAMMER TOE SURGERY Right 2017    Past Medical Hx:  Past Medical History:  Diagnosis Date  . Family history of lymphoma   . Hypertension   . Ovarian cancer ([place]) 09/2011   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 9/13    Past Gynecological History:   GYNECOLOGIC HISTORY:  No LMP recorded. Patient has had a hysterectomy. Menarche: 62 years old P 3 LMP 49 Contraceptive diaphragm in the 58s for about 3 years HRT none    Family Hx:  Family History  Problem Relation Age of Onset  . Hypertension Mother   . Hypertrophic cardiomyopathy Mother 10       pt negativ rofr gene 08/05/12  . Alzheimer's disease Father   . Cancer Brother        Squamous cell of sinus, hx smok inhalation Engineer, drilling)  . Alzheimer's disease Paternal Grandmother   . Hypertrophic cardiomyopathy Son   . Heart attack Paternal Grandfather 74  . Lymphoma Paternal Uncle        late 50's/early 60's    Review of Systems:  Review of  Systems  Constitutional: Negative.   HENT: Negative.   Eyes: Negative.   Respiratory: Negative.   Cardiovascular: Negative.   Gastrointestinal: Negative.   Genitourinary: Negative.   Musculoskeletal: Negative.   Skin: Negative.   Neurological: Negative.   Endo/Heme/Allergies: Negative.   Psychiatric/Behavioral: Negative.    Vitals:  Blood pressure (!) 147/95, pulse (!) 57, temperature 98 F (36.7 C), temperature source Oral, resp. rate 20, height  $'5\' 6"'I$  (1.676 m), weight 173 lb 11.2 oz (78.8 kg), SpO2 99 %. Body mass index is 28.04 kg/m.  Physical Exam: ECOG PERFORMANCE STATUS: 0 - Asymptomatic   General :  Well developed, 62 y.o., female in no apparent distress HEENT:  Normocephalic/atraumatic, symmetric, EOMI, eyelids normal Neck:   Supple, no masses.  Lymphatics:  No cervical/ submandibular/ supraclavicular/ infraclavicular/ inguinal adenopathy Respiratory:  Respirations unlabored, no use of accessory muscles CV:   Deferred Breast:  Deferred Musculoskeletal: No CVA tenderness, normal muscle strength. Abdomen:   Soft, non-tender and nondistended. No evidence of hernia. No masses. Extremities:  No lymphedema, no erythema, non-tender. Skin:   Normal inspection Neuro/Psych:  No focal motor deficit, no abnormal mental status. Normal gait. Normal affect. Alert and oriented to person, place, and time  Genito Urinary: Vulva: Normal external female genitalia.  Bladder/urethra: Urethral meatus normal in size and location. No lesions or   masses, well supported bladder Speculum exam: Vagina: No lesion, no discharge, no bleeding. Cervix: Surgically absent  Bimanual exam:  Uterus: Surgically absent   Adnexa: No masses.  Ovaries are surgically absent Rectovaginal:  Good tone, no masses, no cul de sac nodularity, no parametrial involvement or nodularity.   Oncologic Summary: 1. History of stage II serous ovarian carcinoma  s/P tumor debulking followed by chemotherapy completing  September 2013 2. "BRCA testing" negative   Assessment/Plan: 1. Surveillance plan o We will continue to follow her annually with pelvic and Ca1 25 2. We will find out if her BRCA testing is on her chart o If she was tested prior to panel test or update test we could offer her repeat o I would refer her to genetic counseling if that were the case 3. Return to clinic 1 year for above   Isabel Caprice, MD  12/06/2017, 10:24 AM   Cc: Dr. Doyle Askew (PCP)

## 2017-11-16 ENCOUNTER — Telehealth: Payer: Self-pay

## 2017-11-16 ENCOUNTER — Telehealth: Payer: Self-pay | Admitting: Oncology

## 2017-11-16 DIAGNOSIS — C569 Malignant neoplasm of unspecified ovary: Secondary | ICD-10-CM

## 2017-11-16 LAB — CA 125: Cancer Antigen (CA) 125: 13.8 U/mL (ref 0.0–38.1)

## 2017-11-16 NOTE — Telephone Encounter (Signed)
Called patient and advised her that Leah Perkins, Genetic Counselor is recommending further genetic testing.  Patient said she would be interested in a genetic referral.  Referral placed for genetic counseling.

## 2017-11-16 NOTE — Telephone Encounter (Signed)
Told Ms Saling that the CA-125 from 11-15-17 was good and WNL at 13.8 per Melissa Cross,NP.

## 2017-11-17 ENCOUNTER — Telehealth: Payer: Self-pay | Admitting: Gynecologic Oncology

## 2017-11-17 NOTE — Telephone Encounter (Signed)
Spoke to patient regarding upcoming may appointments °

## 2017-11-30 ENCOUNTER — Inpatient Hospital Stay (HOSPITAL_BASED_OUTPATIENT_CLINIC_OR_DEPARTMENT_OTHER): Payer: Managed Care, Other (non HMO) | Admitting: Genetics

## 2017-11-30 ENCOUNTER — Inpatient Hospital Stay: Payer: Managed Care, Other (non HMO)

## 2017-11-30 ENCOUNTER — Encounter: Payer: Self-pay | Admitting: Genetics

## 2017-11-30 DIAGNOSIS — Z807 Family history of other malignant neoplasms of lymphoid, hematopoietic and related tissues: Secondary | ICD-10-CM | POA: Diagnosis not present

## 2017-11-30 DIAGNOSIS — C569 Malignant neoplasm of unspecified ovary: Secondary | ICD-10-CM | POA: Diagnosis not present

## 2017-11-30 DIAGNOSIS — Z1379 Encounter for other screening for genetic and chromosomal anomalies: Secondary | ICD-10-CM | POA: Diagnosis not present

## 2017-11-30 NOTE — Progress Notes (Signed)
REFERRING PROVIDER: Isabel Caprice, MD [address] [place], [place] [address]  PRIMARY PROVIDER:  Orpah Melter, MD  PRIMARY REASON FOR VISIT:  1. Malignant neoplasm of ovary, unspecified laterality ([place])   2. Family history of lymphoma     HISTORY OF PRESENT ILLNESS:   Leah Perkins, a 62 y.o. female, was seen for a [place] cancer genetics consultation at the request of Dr. Gerarda Fraction due to a personal history of ovarian cancer.  Leah Perkins presents to clinic today to discuss the possibility of a hereditary predisposition to cancer, genetic testing, and to further clarify her future cancer risks, as well as potential cancer risks for family members.   Leah Perkins was diagnosed with ovarian cancer in March 2013.  She was treated in [place], [place].  She had debulking surgery and chemotherapy.  She reports having genetic testing in 2013 for the BRCA1 and BRCA2 genes only that was reportedly negative. Test report unavailable for review.   CANCER HISTORY:    Ovarian cancer f/u   09/23/2011 Initial Diagnosis    IIC serous ovarian cancer treated in [place]       - 03/21/2012 Chemotherapy    s/p 6 cycles of dose dense with paclitaxel and carboplatin       HORMONAL RISK FACTORS:  Ovaries intact: no.  Hysterectomy: yes.  Menopausal status: postmenopausal.  Colonoscopy: yes; 2009, normal.  In the process of setting up her next one. Mammogram within the last year: yes. Number of breast biopsies: 0.  Past Medical History:  Diagnosis Date  . Family history of lymphoma   . Hypertension   . Ovarian cancer ([place]) 09/2011   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 9/13    Past Surgical History:  Procedure Laterality Date  . ABDOMINAL HYSTERECTOMY  3/13 TAHBSO optimal tumor debulking  . APPENDECTOMY    . Arthoscopy Right    Knee    Social History   Socioeconomic History  . Marital status: Married    Spouse name: Not on file  . Number of children: Not on file  . Years  of education: Not on file  . Highest education level: Not on file  Occupational History  . Not on file  Social Needs  . Financial resource strain: Not on file  . Food insecurity:    Worry: Not on file    Inability: Not on file  . Transportation needs:    Medical: Not on file    Non-medical: Not on file  Tobacco Use  . Smoking status: Never Smoker  . Smokeless tobacco: Never Used  Substance and Sexual Activity  . Alcohol use: Yes    Comment: Socially  . Drug use: No  . Sexual activity: Yes  Lifestyle  . Physical activity:    Days per week: Not on file    Minutes per session: Not on file  . Stress: Not on file  Relationships  . Social connections:    Talks on phone: Not on file    Gets together: Not on file    Attends religious service: Not on file    Active member of club or organization: Not on file    Attends meetings of clubs or organizations: Not on file    Relationship status: Not on file  Other Topics Concern  . Not on file  Social History Narrative  . Not on file     FAMILY HISTORY:  We obtained a detailed, 4-generation family history.  Significant diagnoses are listed below: Family History  Problem Relation Age of Onset  . Hypertension Mother   . Hypertrophic cardiomyopathy Mother 65       pt negativ rofr gene 08/05/12  . Alzheimer's disease Father   . Cancer Brother        Squamous cell of sinus, hx smok inhalation Engineer, drilling)  . Alzheimer's disease Paternal Grandmother   . Hypertrophic cardiomyopathy Son   . Heart attack Paternal Grandfather 74  . Lymphoma Paternal Uncle        late 50's/early 60's   Leah Perkins has 3 children.  1 son died at 37 in a car accident- he also had hypertrophic cardiomyopathy, another son died in his 57's due to suicide, and she has a daughter still living who is 41.  She has 2 children.  She has gotten ultrasound screenings for ovarian cancer, and recently had her ovaries removed.  Leah Perkins has 2 brothers.  1 brother died of  squamous cell carcinoma of the sinus.  He was a Airline pilot and did not wear good masks.  He was told he would likely have sinus issues due to this exposure.  Leah Perkins other brother is 52 and has no history of cancer.  No nieces or nephews with any history of cancer.   Leah Perkins father: died at 74 and had Alzheimer's Disease.  Paternal Aunts/Uncles: 1 paternal uncle died of Lymphoma in his late 50's/early 27's.   Paternal cousins: 2 paternal cousins, no history of cancer.  Paternal grandfather: died at 8 due to a heart attack.  Paternal grandmother:died in her 42's due to Alzheimer's disease.   Leah Perkins mother: died at 67 due to hypertrophic cardiomyopathy. She had her uterus and ovaries intact.  Maternal Aunts/Uncles: 1 maternal aunt and 1 maternal uncle died at birth.  2 living maternal aunts with no history of cancer.  Maternal cousins: no history of cancer.  Maternal grandfather: died in his 62's, unk cause Maternal grandmother:no cancer, unk age of death.   Leah Perkins is unaware of previous family history of genetic testing for hereditary cancer risks. Patient's maternal ancestors are of [place] descent, and paternal ancestors are of Italian/English/Irish descent. There is no reported Ashkenazi Jewish ancestry. There is no known consanguinity.  GENETIC COUNSELING ASSESSMENT: Leah Perkins is a 62 y.o. 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 We, therefore, discussed and recommended the following at today's visit.   DISCUSSION: We reviewed the characteristics, features and inheritance patterns of hereditary cancer syndromes. We also discussed genetic testing, including the appropriate family members to test, the process of testing, insurance coverage and turn-around-time for results. We discussed the implications of a negative, positive and/or variant of uncertain significant result. We recommended Leah Perkins pursue  genetic testing for the Common Hereditary Cancers Panel + Myelodysplastic Syndrome/Leukemia gene panel.   The Common Hereditary Cancer Panel offered by [facility] includes sequencing and/or deletion duplication testing of the following 47 genes: APC, ATM, AXIN2, BARD1, BMPR1A, BRCA1, BRCA2, BRIP1, CDH1, CDKN2A (p14ARF), CDKN2A (p16INK4a), CKD4, CHEK2, CTNNA1, DICER1, EPCAM (Deletion/duplication testing only), GREM1 (promoter region deletion/duplication testing only), KIT, MEN1, MLH1, MSH2, MSH3, MSH6, MUTYH, NBN, NF1, NHTL1, PALB2, PDGFRA, PMS2, POLD1, POLE, PTEN, RAD50, RAD51C, RAD51D, SDHB, SDHC, SDHD, SMAD4, SMARCA4. STK11, TP53, TSC1, TSC2, and VHL.  The following genes were evaluated for sequence changes only: SDHA and HOXB13 c.251G>A variant only.  [facility] Myelodysplastic Syndrome/Leukemia Panel ATM BLM CEBPA EPCAM GATA2 HRAS MLH1  MSH2 MSH6 NBN NF1 PMS2 RUNX1 TERC TERT TP53   We discussed that about 15-20% of ovarian cancers are associated with a Hereditary cancer predisposition syndrome.  One of the most common hereditary cancer syndromes that increases ovarian cancer risk is called Hereditary Breast and Ovarian Cancer (HBOC) syndrome. She has already tested negative for these genes, and it is unlikely we will identify a mutation in one of these genes.  However, there are also many other cancer predisposition syndromes caused by mutations in several other genes.  (ex: RAD50C, RAD51D, BRIP1, Lynch syndrome, etc.)  We discussed that if she is found to have a mutation in one of these genes, it may impact future medical management recommendations such as increased cancer screenings and consideration of risk reducing surgeries.  A positive result could also have implications for the patient's family members.  A Negative result would mean we were unable to identify a hereditary component to her cancer, but does not rule out the possibility of a hereditary basis for her ovarian cancer.  There could be  mutations that are undetectable by current technology, or in genes not yet tested or identified to increase cancer risk.    We discussed the potential to find a Variant of Uncertain Significance or VUS.  These are variants that have not yet been identified as pathogenic or benign, and it is unknown if this variant is associated with increased cancer risk or if this is a normal finding.  Most VUS's are reclassified to benign or likely benign.   It should not be used to make medical management decisions. With time, we suspect the lab will determine the significance of any VUS's identified if any.   We briefly discussed that genetic testing can be performed on tumor/cancer samples for patients with ovarian cancer.  She is in remission and is not undergoing any treatment in the foreseeable future, and therefore declined any tumor testing.  She knows it is an Designer, fashion/clothing should she ever be interested in tumor testing for treatment purposes.   Based on Leah Perkins personal history of cancer, she meets medical criteria for genetic testing. Despite that she meets criteria, she may still have an out of pocket cost. The Laboratory, [facility], will send an estimate of the cost using her insurance and will also offer the patient pay price of $250.  She should respond to any communications from the laboratory in a timely manner.   PLAN: After considering the risks, benefits, and limitations, Leah Perkins  provided informed consent to pursue genetic testing and the blood sample was sent to [facility] for analysis of the Common Hereditary Cancers Panel + Myelodysplastic/Leukemia panel. Results should be available within approximately 2-3 weeks' time, at which point they will be disclosed by telephone to Leah Perkins, as will any additional recommendations warranted by these results. Leah Perkins will receive a summary of her genetic counseling visit and a copy of her results once available. This information will  also be available in Epic. We encouraged Leah Perkins to remain in contact with cancer genetics annually so that we can continuously update the family history and inform her of any changes in cancer genetics and testing that may be of benefit for her family. Leah Perkins questions were answered to her satisfaction today. Our contact information was provided should additional questions or concerns arise.  Lastly, we encouraged Leah Perkins to remain in contact with cancer genetics annually so that we can continuously update the family history and inform her  of any changes in cancer genetics and testing that may be of benefit for this family.   Ms.  Wickware questions were answered to her satisfaction today. Our contact information was provided should additional questions or concerns arise. Thank you for the referral and allowing [place] to share in the care of your patient.   Tana Felts, MS, [facility] Certified Genetic Counselor [email or web address] phone: [phone]  The patient was seen for a total of 35 minutes in face-to-face genetic counseling.  This patient was discussed with Drs. Magrinat, Lindi Adie and/or Burr Medico who agrees with the above.

## 2017-12-06 ENCOUNTER — Encounter: Payer: Self-pay | Admitting: Obstetrics

## 2017-12-14 LAB — HM COLONOSCOPY

## 2017-12-16 ENCOUNTER — Ambulatory Visit: Payer: Self-pay | Admitting: Genetics

## 2017-12-16 ENCOUNTER — Telehealth: Payer: Self-pay | Admitting: Genetics

## 2017-12-16 DIAGNOSIS — Z1379 Encounter for other screening for genetic and chromosomal anomalies: Secondary | ICD-10-CM

## 2017-12-16 DIAGNOSIS — Z807 Family history of other malignant neoplasms of lymphoid, hematopoietic and related tissues: Secondary | ICD-10-CM

## 2017-12-16 DIAGNOSIS — C569 Malignant neoplasm of unspecified ovary: Secondary | ICD-10-CM

## 2017-12-16 HISTORY — DX: Encounter for other screening for genetic and chromosomal anomalies: Z13.79

## 2017-12-16 NOTE — Telephone Encounter (Signed)
Revealed negative genetic testing.    This normal result is reassuring and indicates that it is unlikely Leah Perkins cancer is due to a hereditary cause.  It is unlikely that there is an increased risk of another cancer due to a mutation in one of these genes.  However, genetic testing is not perfect, and cannot definitively rule out a hereditary cause.  It will be important for her to keep in contact with genetics to learn if any additional testing may be needed in the future.

## 2017-12-16 NOTE — Progress Notes (Signed)
HPI:  Ms. Goza was previously seen in the [facility] clinic on 11/30/2017 due to a personal history of ovarian cancer and concerns regarding a hereditary predisposition to cancer. Please refer to our prior cancer genetics clinic note for more information regarding Ms. Jeancharles medical, social and family histories, and our assessment and recommendations, at the time. Ms. Kotara recent genetic test results were disclosed to her, as well as recommendations warranted by these results. These results and recommendations are discussed in more detail below.  CANCER HISTORY:    Ovarian cancer f/u   09/23/2011 Initial Diagnosis    IIC serous ovarian cancer treated in [place]       - 03/21/2012 Chemotherapy    s/p 6 cycles of dose dense with paclitaxel and carboplatin      12/08/2017 Genetic Testing    The Common Hereditary Cancers panel + Myelodysplastic syndrome/Leukemia panel was ordered. The following genes were evaluated for sequence changes and exonic deletions/duplications: APC, ATM, AXIN2, BARD1, BLM, BMPR1A, BRCA1, BRCA2, BRIP1, CDH1, CDK4, CDKN2A (p14ARF), CDKN2A (p16INK4a), CEBPA,CHEK2, CTNNA1, DICER1, EPCAM*, GATA2, GREM1*, HRAS, KIT, MEN1, MLH1, MSH2, MSH3, MSH6, MUTYH, NBN, NF1, PALB2, PDGFRA, PMS2, POLD1, POLE, PTEN, RAD50, RAD51C, RAD51D, RUNX1, SDHB, SDHC, SDHD, SMAD4, SMARCA4, STK11, TERC, TERT, TP53, TSC1, TSC2, VHL. The following genes were evaluated for sequence changes only: HOXB13*, NTHL1*, SDHA  Results: Negative, no pathogenic variants identified.  The date of this test report is 12/08/2017.         FAMILY HISTORY:  We obtained a detailed, 4-generation family history.  Significant diagnoses are listed below: Family History  Problem Relation Age of Onset  . Hypertension Mother   . Hypertrophic cardiomyopathy Mother 68       pt negativ rofr gene 08/05/12  . Alzheimer's disease Father   . Cancer Brother        Squamous cell of sinus, hx smok inhalation Engineer, drilling)  .  Alzheimer's disease Paternal Grandmother   . Hypertrophic cardiomyopathy Son   . Heart attack Paternal Grandfather 74  . Lymphoma Paternal Uncle        late 50's/early 65's    Ms. Loretto has 3 children.  1 son died at 60 in a car accident- he also had hypertrophic cardiomyopathy, another son died in his 36's due to suicide, and she has a daughter still living who is 15.  She has 2 children.  She has gotten ultrasound screenings for ovarian cancer, and recently had her ovaries removed.  Ms. Blackston has 2 brothers.  1 brother died of squamous cell carcinoma of the sinus.  He was a Airline pilot and did not wear good masks.  He was told he would likely have sinus issues due to this exposure.  Ms. Sevcik other brother is 49 and has no history of cancer.  No nieces or nephews with any history of cancer.   Ms. Bostwick father: died at 38 and had Alzheimer's Disease.  Paternal Aunts/Uncles: 1 paternal uncle died of Lymphoma in his late 50's/early 88's.   Paternal cousins: 2 paternal cousins, no history of cancer.  Paternal grandfather: died at 45 due to a heart attack.  Paternal grandmother:died in her 24's due to Alzheimer's disease.   Ms. Stroud mother: died at 69 due to hypertrophic cardiomyopathy. She had her uterus and ovaries intact.  Maternal Aunts/Uncles: 1 maternal aunt and 1 maternal uncle died at birth.  2 living maternal aunts with no history of cancer.  Maternal cousins: no history of cancer.  Maternal grandfather:  died in his 62's, unk cause Maternal grandmother:no cancer, unk age of death.   Ms. Lenk is unaware of previous family history of genetic testing for hereditary cancer risks. Patient's maternal ancestors are of [place] descent, and paternal ancestors are of Italian/English/Irish descent. There is no reported Ashkenazi Jewish ancestry. There is no known consanguinity.  GENETIC TEST RESULTS: Genetic testing performed through [facility] Common Hereditary Cancers Panel +  myelodysplastic syndrome/Leukemia panel reported out on 12/08/2017 showed no pathogenic mutations. APC, ATM, AXIN2, BARD1, BLM, BMPR1A, BRCA1, BRCA2, BRIP1, CDH1, CDK4, CDKN2A (p14ARF), CDKN2A (p16INK4a), CEBPA, CHEK2, CTNNA1, DICER1, EPCAM*, GATA2, GREM1*, HRAS, KIT, MEN1, MLH1, MSH2, MSH3, MSH6, MUTYH, NBN, NF1, PALB2, PDGFRA, PMS2, POLD1, POLE, PTEN, RAD50, RAD51C, RAD51D, RUNX1, SDHB, SDHC, SDHD, SMAD4, SMARCA4, STK11, TERC, TERT, TP53, TSC1, TSC2, VHL The following genes were evaluated for sequence changes only:HOXB13*, NTHL1*, SDHA.  The test report will be scanned into EPIC and will be located under the Molecular Pathology section of the Results Review tab. A portion of the result report is included below for reference.     We discussed with Ms. Tortorelli that because current genetic testing is not perfect, it is possible there may be a gene mutation in one of these genes that current testing cannot detect, but that chance is small.  We also discussed, that there could be another gene that has not yet been discovered, or that we have not yet tested, that is responsible for the cancer diagnoses in the family. It is also possible there is a hereditary cause for the cancer in the family that Ms. Roderick did not inherit and therefore was not identified in her testing.  Therefore, it is important to remain in touch with cancer genetics in the future so that we can continue to offer Ms. Lona the most up to date genetic testing.   ADDITIONAL GENETIC TESTING: We discussed with Ms. Elgin that there are other genes that are associated with increased cancer risk that can be analyzed. The laboratories that offer this testing look at these additional genes via a hereditary cancer gene panel. Should Ms. Shuffler wish to pursue additional genetic testing, we are happy to discuss and coordinate this testing, at any time.    CANCER SCREENING RECOMMENDATIONS: This result indicates that it is unlikely Ms. Soza has an  increased risk for a future cancer due to a mutation in one of these genes. This normal test also suggests that Ms. Meikle cancer was most likely not due to an inherited predisposition associated with one of these genes.  Most cancers happen by chance and this negative test suggests that her cancer may fall into this category.  Therefore, it is recommended she continue to follow the cancer management and screening guidelines provided by her oncology and primary healthcare provider. Other factors such as her personal and family history may still affect her cancer risk.  RECOMMENDATIONS FOR FAMILY MEMBERS:  Relatives in this family might be at some increased risk of developing cancer, over the general population risk, simply due to the family history of cancer.  We recommended women in this family have a yearly mammogram beginning at age 84, or 84 years younger than the earliest onset of cancer, an annual clinical breast exam, and perform monthly breast self-exams. Women in this family should also have a gynecological exam as recommended by their primary provider. All family members should have a colonoscopy by age 60 (or as directed by their doctors).  All family members should inform their physicians  about the family history of cancer so their doctors can make the most appropriate screening recommendations for them.   FOLLOW-UP: Lastly, we discussed with Ms. Laker that cancer genetics is a rapidly advancing field and it is possible that new genetic tests will be appropriate for her and/or her family members in the future. We encouraged her to remain in contact with cancer genetics on an annual basis so we can update her personal and family histories and let her know of advances in cancer genetics that may benefit this family.   Our contact number was provided. Ms. Moffit questions were answered to her satisfaction, and she knows she is welcome to call us at anytime with additional questions or concerns.    Ferol Luz, MS, [facility] Certified Genetic Counselor [email or web address]

## 2017-12-20 ENCOUNTER — Encounter: Payer: Self-pay | Admitting: Genetics

## 2018-03-18 NOTE — Progress Notes (Signed)
[facility] [address] [place], [place] [address] Phone: [phone] Subjective:     I Leah Perkins am serving as a Education administrator for Dr. Hulan Saas.   CC: Right knee pain  [id number]  Leah Perkins is a 62 y.o. female coming in with complaint of right pain. [place] going up the stairs. No numbness and tingling noted. After flexion knee feels locked.   Onset- 1 month ago Location- superior patella  Character- Sharp Aggravating factors- Extension, weight bearing. Flexion  Reliving factors-  Therapies tried- Has tried ice  Severity-initially 7 out of 10 but now 3 out of 10     Past Medical History:  Diagnosis Date  . Family history of lymphoma   . Hypertension   . Ovarian cancer ([place]) 09/2011   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 9/13   Past Surgical History:  Procedure Laterality Date  . ABDOMINAL HYSTERECTOMY  09/2011   TAHBSO with appendectomy and optimal tumor debulking  . Arthoscopy Right    Knee  . HAMMER TOE SURGERY Right 2017   Social History   Socioeconomic History  . Marital status: Married    Spouse name: Not on file  . Number of children: Not on file  . Years of education: Not on file  . Highest education level: Not on file  Occupational History  . Not on file  Social Needs  . Financial resource strain: Not on file  . Food insecurity:    Worry: Not on file    Inability: Not on file  . Transportation needs:    Medical: Not on file    Non-medical: Not on file  Tobacco Use  . Smoking status: Never Smoker  . Smokeless tobacco: Never Used  Substance and Sexual Activity  . Alcohol use: Yes    Comment: Socially ~1 / week  . Drug use: No  . Sexual activity: Yes  Lifestyle  . Physical activity:    Days per week: Not on file    Minutes per session: Not on file  . Stress: Not on file  Relationships  . Social connections:    Talks on phone: Not on file    Gets together: Not on file   Attends religious service: Not on file    Active member of club or organization: Not on file    Attends meetings of clubs or organizations: Not on file    Relationship status: Not on file  Other Topics Concern  . Not on file  Social History Narrative  . Not on file   Allergies  Allergen Reactions  . Sulfa Antibiotics     "does not remember"   Family History  Problem Relation Age of Onset  . Hypertension Mother   . Hypertrophic cardiomyopathy Mother 67       pt negativ rofr gene 08/05/12  . Alzheimer's disease Father   . Cancer Brother        Squamous cell of sinus, hx smok inhalation Engineer, drilling)  . Alzheimer's disease Paternal Grandmother   . Hypertrophic cardiomyopathy Son   . Heart attack Paternal Grandfather 74  . Lymphoma Paternal Uncle        late 50's/early 60's     Current Outpatient Medications (Cardiovascular):  .  atenolol (TENORMIN) 25 MG tablet, Take 25 mg by mouth daily.     Current Outpatient Medications (Other):  Marland Kitchen  Calcium Carb-Cholecalciferol (CALCIUM 600 + D PO), Take 1 tablet by mouth 2 (  two) times daily. .  Multiple Vitamins-Minerals (MULTIVITAMIN WITH MINERALS) tablet, Take 1 tablet by mouth daily. .  vitamin C (ASCORBIC ACID) 500 MG tablet, Take 500 mg by mouth 2 (two) times daily. .  Vitamin D, Ergocalciferol, (DRISDOL) 50000 units CAPS capsule, Take 1 capsule (50,000 Units total) by mouth every 7 (seven) days.    Past medical history, social, surgical and family history all reviewed in electronic medical record.  No pertanent information unless stated regarding to the chief complaint.   Review of Systems:  No headache, visual changes, nausea, vomiting, diarrhea, constipation, dizziness, abdominal pain, skin rash, fevers, chills, night sweats, weight loss, swollen lymph nodes, body aches, joint swelling,  chest pain, shortness of breath, mood changes.  Positive muscle aches  Objective  Blood pressure 126/80, pulse 69, height 5\' 6"  (1.676 m),  weight 173 lb (78.5 kg), SpO2 98 %.    General: No apparent distress alert and oriented x3 mood and affect normal, dressed appropriately.  HEENT: Pupils equal, extraocular movements intact  Respiratory: Patient's speak in full sentences and does not appear short of breath  Cardiovascular: No lower extremity edema, non tender, no erythema  Skin: Warm dry intact with no signs of infection or rash on extremities or on axial skeleton.  Abdomen: Soft nontender  Neuro: Cranial nerves II through XII are intact, neurovascularly intact in all extremities with 2+ DTRs and 2+ pulses.  Lymph: No lymphadenopathy of posterior or anterior cervical chain or axillae bilaterally.  Gait antalgic MSK:  Non tender with full range of motion and good stability and symmetric strength and tone of shoulders, elbows, wrist, hip, and ankles bilaterally.  Knee: Right Normal to inspection with no erythema or effusion or obvious bony abnormalities. Tender over the superior aspect of the patella and the quadricep tendon Ligaments with solid consistent endpoints including ACL, PCL, LCL, MCL. Negative Mcmurray's, Apley's, and Thessalonian tests. painful patellar compression. Patellar glide with moderate crepitus  Patellar and quadriceps tendons unremarkable. Hamstring and quadriceps strength is normal. Contralateral knee unremarkable.  MSK US performed of: Right knee This study was ordered, performed, and interpreted by Charlann Boxer D.O.  Knee: All structures visualized. Anteromedial, anterolateral, posteromedial, and posterolateral menisci unremarkable without tearing, fraying, effusion, or displacement. Patellar Tendon unremarkable on long and transverse views without effusion. Patient does have some bruising which appears to be more of a contusion of the anterior aspect of the patella  IMPRESSION: Patella contusion     Impression and Recommendations:     This case required medical decision making of moderate  complexity. The above documentation has been reviewed and is accurate and complete Lyndal Pulley, DO       Note: This dictation was prepared with Dragon dictation along with smaller phrase technology. Any transcriptional errors that result from this process are unintentional.

## 2018-03-21 ENCOUNTER — Ambulatory Visit: Payer: Managed Care, Other (non HMO) | Admitting: Family Medicine

## 2018-03-21 ENCOUNTER — Encounter: Payer: Self-pay | Admitting: Family Medicine

## 2018-03-21 ENCOUNTER — Ambulatory Visit: Payer: Self-pay

## 2018-03-21 VITALS — BP 126/80 | HR 69 | Ht 66.0 in | Wt 173.0 lb

## 2018-03-21 DIAGNOSIS — S8001XA Contusion of right knee, initial encounter: Secondary | ICD-10-CM

## 2018-03-21 DIAGNOSIS — G8929 Other chronic pain: Secondary | ICD-10-CM | POA: Diagnosis not present

## 2018-03-21 DIAGNOSIS — M25561 Pain in right knee: Secondary | ICD-10-CM

## 2018-03-21 HISTORY — DX: Contusion of right knee, initial encounter: S80.01XA

## 2018-03-21 MED ORDER — VITAMIN D (ERGOCALCIFEROL) 1.25 MG (50000 UNIT) PO CAPS: 50000.0000 [IU] | ORAL_CAPSULE | ORAL | 0 refills | Status: DC

## 2018-03-21 NOTE — Patient Instructions (Addendum)
You guys are my favorite  Good to see you  Ice is your friend but start with heat 10 minutes then ice after that for 10-20 minutes Exercises 3 times a week.    Once weekly vitamin D for 12 weeks See me again in 4 weeks

## 2018-03-21 NOTE — Assessment & Plan Note (Signed)
3 weeks on, discussed icing regimen, home exercise, we discussed topical medicines.  Discussed which activities of doing which wants to avoid.  Once weekly vitamin D given to help with any type of acute fracture or occult fracture that could be contributing.  Follow-up again in 4 weeks to make patient sure she has healing

## 2018-04-07 ENCOUNTER — Ambulatory Visit: Payer: Managed Care, Other (non HMO) | Admitting: Family Medicine

## 2018-04-18 NOTE — Progress Notes (Signed)
[facility] [address] [place], [place] [address] Phone: [phone] Subjective:    I Leah Perkins am serving as a Education administrator for Dr. Hulan Saas.     CC: Right knee pain follow-up  [id number]  Leah Perkins is a 62 y.o. female coming in with complaint of right knee pain. States that the knee is feeling really good. Last week her knee gave out on her. Monday she had a hard time walking on it. Has been using Pennsaid which helps the pain.       Past Medical History:  Diagnosis Date  . Family history of lymphoma   . Hypertension   . Ovarian cancer ([place]) 09/2011   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 9/13   Past Surgical History:  Procedure Laterality Date  . ABDOMINAL HYSTERECTOMY  09/2011   TAHBSO with appendectomy and optimal tumor debulking  . Arthoscopy Right    Knee  . HAMMER TOE SURGERY Right 2017   Social History   Socioeconomic History  . Marital status: Married    Spouse name: Not on file  . Number of children: Not on file  . Years of education: Not on file  . Highest education level: Not on file  Occupational History  . Not on file  Social Needs  . Financial resource strain: Not on file  . Food insecurity:    Worry: Not on file    Inability: Not on file  . Transportation needs:    Medical: Not on file    Non-medical: Not on file  Tobacco Use  . Smoking status: Never Smoker  . Smokeless tobacco: Never Used  Substance and Sexual Activity  . Alcohol use: Yes    Comment: Socially ~1 / week  . Drug use: No  . Sexual activity: Yes  Lifestyle  . Physical activity:    Days per week: Not on file    Minutes per session: Not on file  . Stress: Not on file  Relationships  . Social connections:    Talks on phone: Not on file    Gets together: Not on file    Attends religious service: Not on file    Active member of club or organization: Not on file    Attends meetings of clubs or organizations:  Not on file    Relationship status: Not on file  Other Topics Concern  . Not on file  Social History Narrative  . Not on file   Allergies  Allergen Reactions  . Sulfa Antibiotics     "does not remember"   Family History  Problem Relation Age of Onset  . Hypertension Mother   . Hypertrophic cardiomyopathy Mother 14       pt negativ rofr gene 08/05/12  . Alzheimer's disease Father   . Cancer Brother        Squamous cell of sinus, hx smok inhalation Engineer, drilling)  . Alzheimer's disease Paternal Grandmother   . Hypertrophic cardiomyopathy Son   . Heart attack Paternal Grandfather 74  . Lymphoma Paternal Uncle        late 50's/early 60's     Current Outpatient Medications (Cardiovascular):  .  atenolol (TENORMIN) 25 MG tablet, Take 25 mg by mouth daily.     Current Outpatient Medications (Other):  Marland Kitchen  Calcium Carb-Cholecalciferol (CALCIUM 600 + D PO), Take 1 tablet by mouth 2 (two) times daily. .  Multiple Vitamins-Minerals (MULTIVITAMIN WITH MINERALS) tablet, Take 1 tablet  by mouth daily. .  vitamin C (ASCORBIC ACID) 500 MG tablet, Take 500 mg by mouth 2 (two) times daily. .  Vitamin D, Ergocalciferol, (DRISDOL) 50000 units CAPS capsule, Take 1 capsule (50,000 Units total) by mouth every 7 (seven) days.    Past medical history, social, surgical and family history all reviewed in electronic medical record.  No pertanent information unless stated regarding to the chief complaint.   Review of Systems:  No headache, visual changes, nausea, vomiting, diarrhea, constipation, dizziness, abdominal pain, skin rash, fevers, chills, night sweats, weight loss, swollen lymph nodes, body aches, joint swelling, muscle aches, chest pain, shortness of breath, mood changes.   Objective  Blood pressure (!) 142/80, pulse (!) 57, height 5\' 6"  (1.676 m), weight 173 lb (78.5 kg), SpO2 93 %.   General: No apparent distress alert and oriented x3 mood and affect normal, dressed appropriately.    HEENT: Pupils equal, extraocular movements intact  Respiratory: Patient's speak in full sentences and does not appear short of breath  Cardiovascular: No lower extremity edema, non tender, no erythema  Skin: Warm dry intact with no signs of infection or rash on extremities or on axial skeleton.  Abdomen: Soft nontender  Neuro: Cranial nerves II through XII are intact, neurovascularly intact in all extremities with 2+ DTRs and 2+ pulses.  Lymph: No lymphadenopathy of posterior or anterior cervical chain or axillae bilaterally.  Gait normal with good balance and coordination.  MSK:  Non tender with full range of motion and good stability and symmetric strength and tone of shoulders, elbows, wrist, hip, knee and ankles bilaterally.   Knee:right  Normal to inspection with no erythema or effusion or obvious bony abnormalities. Palpation tenderness over the patella itself mostly laterally ROM full in flexion and extension and lower leg rotation. Ligaments with solid consistent endpoints including ACL, PCL, LCL, MCL. Negative Mcmurray's, Apley's, and Thessalonian tests. painful patellar compression. Patellar glide with mild crepitus. Patellar and quadriceps tendons unremarkable. Hamstring and quadriceps strength is normal.   Impression and Recommendations:     This case required medical decision making of moderate complexity. The above documentation has been reviewed and is accurate and complete Lyndal Pulley, DO       Note: This dictation was prepared with Dragon dictation along with smaller phrase technology. Any transcriptional errors that result from this process are unintentional.

## 2018-04-20 ENCOUNTER — Encounter: Payer: Self-pay | Admitting: Family Medicine

## 2018-04-20 ENCOUNTER — Ambulatory Visit: Payer: Managed Care, Other (non HMO) | Admitting: Family Medicine

## 2018-04-20 DIAGNOSIS — M222X1 Patellofemoral disorders, right knee: Secondary | ICD-10-CM | POA: Diagnosis not present

## 2018-04-20 HISTORY — DX: Patellofemoral disorders, right knee: M22.2X1

## 2018-04-20 NOTE — Patient Instructions (Signed)
Good to see you Ice 20 minutes 2 times daily. Usually after activity and before bed. pennsaid pinkie amount topically 2 times daily as needed.  Wear brace with a lot of walking and exercises Exercises 3 times a week.  You should be good in 4 weeks and lets see you then

## 2018-04-20 NOTE — Assessment & Plan Note (Signed)
Patellofemoral Syndrome  Reviewed anatomy using anatomical model and how PFS occurs.  Given rehab exercises handout for VMO, hip abductors, core, entire kinetic chain including proprioception exercises including cone touches, step downs, hip elevations and turn outs.  Could benefit from PT, regular exercise, upright biking, and a PFS knee brace to assist with tracking abnormalities. RTC in 4 weeks  

## 2018-05-23 NOTE — Progress Notes (Signed)
[facility] [address], [place] [address] Phone: [phone] Subjective:    I Kandace Blitz am serving as a Education administrator for Dr. Hulan Saas.   CC: Right knee pain follow-up  [id number]  KAYLI BEAL is a 62 y.o. female coming in with complaint of knee pain. States the knee is doing well.  Patient states that she is feeling 100%.  Has been able to go up and down the stairs with no problem.  Has been wearing the brace when she is doing a lot of walking but nothing significant at all at this moment.      Past Medical History:  Diagnosis Date  . Family history of lymphoma   . Hypertension   . 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 ABDOMINAL HYSTERECTOMY  09/2011   TAHBSO with appendectomy and optimal tumor debulking  . Arthoscopy Right    Knee  . HAMMER TOE SURGERY Right 2017   Social History   Socioeconomic History  . Marital status: Married    Spouse name: Not on file  . Number of children: Not on file  . Years of education: Not on file  . Highest education level: Not on file  Occupational History  . Not on file  Social Needs  . Financial resource strain: Not on file  . Food insecurity:    Worry: Not on file    Inability: Not on file  . Transportation needs:    Medical: Not on file    Non-medical: Not on file  Tobacco Use  . Smoking status: Never Smoker  . Smokeless tobacco: Never Used  Substance and Sexual Activity  . Alcohol use: Yes    Comment: Socially ~1 / week  . Drug use: No  . Sexual activity: Yes  Lifestyle  . Physical activity:    Days per week: Not on file    Minutes per session: Not on file  . Stress: Not on file  Relationships  . Social connections:    Talks on phone: Not on file    Gets together: Not on file    Attends religious service: Not on file    Active member of club or  organization: Not on file    Attends meetings of clubs or organizations: Not on file    Relationship status: Not on file  Other Topics Concern  . Not on file  Social History Narrative  . Not on file   Allergies  Allergen Reactions  . Sulfa Antibiotics     "does not remember"   Family History  Problem Relation Age of Onset  . Hypertension Mother   . Hypertrophic cardiomyopathy Mother 42       pt negativ rofr gene 08/05/12  . Alzheimer's disease Father   . Cancer Brother        Squamous cell of sinus, hx smok inhalation Engineer, drilling)  . Alzheimer's disease Paternal Grandmother   . Hypertrophic cardiomyopathy Son   . Heart attack Paternal Grandfather 74  . Lymphoma Paternal Uncle        late 50's/early 60's     Current Outpatient Medications (Cardiovascular):  .  atenolol (TENORMIN) 25 MG tablet, Take 25 mg by mouth daily.     Current Outpatient Medications (Other):  Marland Kitchen  Calcium Carb-Cholecalciferol (CALCIUM 600 + D PO), Take 1 tablet by mouth 2 (two)  times daily. .  Multiple Vitamins-Minerals (MULTIVITAMIN WITH MINERALS) tablet, Take 1 tablet by mouth daily. .  vitamin C (ASCORBIC ACID) 500 MG tablet, Take 500 mg by mouth 2 (two) times daily. .  Vitamin D, Ergocalciferol, (DRISDOL) 50000 units CAPS capsule, Take 1 capsule (50,000 Units total) by mouth every 7 (seven) days.    Past medical history, social, surgical and family history all reviewed in electronic medical record.  No pertanent information unless stated regarding to the chief complaint.   Review of Systems:  No headache, visual changes, nausea, vomiting, diarrhea, constipation, dizziness, abdominal pain, skin rash, fevers, chills, night sweats, weight loss, swollen lymph nodes, body aches, joint swelling, m chest pain, shortness of breath, mood changes.  Positive muscle aches  Objective  Blood pressure 100/80, pulse (!) 55, height 5\' 6"  (1.676 m), weight 173 lb (78.5 kg), SpO2 94 %.    General: No apparent  distress alert and oriented x3 mood and affect normal, dressed appropriately.  HEENT: Pupils equal, extraocular movements intact  Respiratory: Patient's speak in full sentences and does not appear short of breath  Cardiovascular: No lower extremity edema, non tender, no erythema  Skin: Warm dry intact with no signs of infection or rash on extremities or on axial skeleton.  Abdomen: Soft nontender  Neuro: Cranial nerves II through XII are intact, neurovascularly intact in all extremities with 2+ DTRs and 2+ pulses.  Lymph: No lymphadenopathy of posterior or anterior cervical chain or axillae bilaterally.  Gait normal with good balance and coordination.  MSK:  Non tender with full range of motion and good stability and symmetric strength and tone of shoulders, elbows, wrist, hip and ankles bilaterally.  Knee: Right Normal to inspection with no erythema or effusion or obvious bony abnormalities. Palpation normal with no warmth, joint line tenderness, patellar tenderness, or condyle tenderness. ROM full in flexion and extension and lower leg rotation. Ligaments with solid consistent endpoints including ACL, PCL, LCL, MCL. Negative Mcmurray's, Apley's, and Thessalonian tests. Mild painful patellar compression. Patellar glide mild crepitus. Patellar and quadriceps tendons unremarkable. Hamstring and quadriceps strength is normal. Contralateral knee unremarkable   Impression and Recommendations:     This case required medical decision making of moderate complexity. The above documentation has been reviewed and is accurate and complete Lyndal Pulley, DO       Note: This dictation was prepared with Dragon dictation along with smaller phrase technology. Any transcriptional errors that result from this process are unintentional.

## 2018-05-24 ENCOUNTER — Encounter: Payer: Self-pay | Admitting: Family Medicine

## 2018-05-24 ENCOUNTER — Ambulatory Visit: Payer: 59 | Admitting: Family Medicine

## 2018-05-24 DIAGNOSIS — M222X1 Patellofemoral disorders, right knee: Secondary | ICD-10-CM

## 2018-05-24 NOTE — Assessment & Plan Note (Signed)
Significant improvement.  Doing very well.  No changes in management and see me when needed

## 2018-06-29 ENCOUNTER — Other Ambulatory Visit: Payer: Self-pay | Admitting: Obstetrics

## 2018-06-29 DIAGNOSIS — Z1231 Encounter for screening mammogram for malignant neoplasm of breast: Secondary | ICD-10-CM

## 2018-08-15 ENCOUNTER — Ambulatory Visit
Admission: RE | Admit: 2018-08-15 | Discharge: 2018-08-15 | Disposition: A | Discharge status: Home / Self Care | Payer: 59 | Source: Ambulatory Visit | Attending: Obstetrics | Admitting: Obstetrics

## 2018-08-15 DIAGNOSIS — Z1231 Encounter for screening mammogram for malignant neoplasm of breast: Secondary | ICD-10-CM

## 2018-09-15 ENCOUNTER — Telehealth: Payer: Self-pay

## 2018-09-15 NOTE — Telephone Encounter (Signed)
Incoming call from pt regarding needs her follow up for May with different provider (was Dr Gerarda Fraction no longer here)- appt given for May 28th at 9:45 am.  No other needs per pt at this time.

## 2018-12-06 NOTE — Progress Notes (Signed)
Progress Note: Gyn-Onc  Initially referred by Dr. Crawford Givens after completing primary treatment for Stage 2C ovarian CA 2013.   CC:  Chief Complaint  Patient presents with  . Malignant neoplasm of ovary, unspecified laterality [facility])     HPI: Ms. Leah Perkins  is a very nice 63 y.o. P3  Although she is 44 years NED Dr. Alycia Rossetti had discussed follow-up with [place] once a year to do her pelvic examinations as well as check her CA-125.   Marland Kitchen Interval History:  Since her last visit with Dr. Alycia Rossetti 11/04/2016 she has had no new issues.  She specifically denies pelvic pain denies change in appetite denies bloating.  She has no new past medical past surgical or family history.  . 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 At that time she underwent optimal debulking and was treated with 6 cycles of dose dense paclitaxel and carboplatin. She completed her therapy in September 2013.   CA 125 initially was 99 and prior to her move from [place] it nadired at 3.   She had also undergone genetic testing and is BRCA negative.   Her gynecologic oncologist in [place] referred her to [place] for followup. He did discuss with her that the recurrence of a woman with stage II cancer would be approximately 30-40% and a discuss options regarding consolidation but opted for close surveillance. She did very well from her chemotherapy and had no significant issues had manageable neutropenia and no significant neuropathy in her performance status was 0.   ~~Possibly relevant is her use of genital application of talcum powder when she cleansed herself~~  Measurement of disease:  CA125  Preoperative = 99  Nadir = 3  Lab Results  Component Value Date   CA125 5 05/26/2016   CA125 4 11/27/2015   CA125 4 06/12/2015   CA125 5 11/28/2014     Radiology: . Nothing relevant in the [facility] system    Oncologic History:      Ovarian cancer f/u   09/23/2011  Initial Diagnosis    IIC serous ovarian cancer treated in [place]     - 03/21/2012 Chemotherapy    s/p 6 cycles of dose dense with paclitaxel and carboplatin    12/08/2017 Genetic Testing    The Common Hereditary Cancers panel + Myelodysplastic syndrome/Leukemia panel was ordered. The following genes were evaluated for sequence changes and exonic deletions/duplications: APC, ATM, AXIN2, BARD1, BLM, BMPR1A, BRCA1, BRCA2, BRIP1, CDH1, CDK4, CDKN2A (p14ARF), CDKN2A (p16INK4a), CEBPA,CHEK2, CTNNA1, DICER1, EPCAM*, GATA2, GREM1*, HRAS, KIT, MEN1, MLH1, MSH2, MSH3, MSH6, MUTYH, NBN, NF1, PALB2, PDGFRA, PMS2, POLD1, POLE, PTEN, RAD50, RAD51C, RAD51D, RUNX1, SDHB, SDHC, SDHD, SMAD4, SMARCA4, STK11, TERC, TERT, TP53, TSC1, TSC2, VHL. The following genes were evaluated for sequence changes only: HOXB13*, NTHL1*, SDHA  Results: Negative, no pathogenic variants identified.  The date of this test report is 12/08/2017.      The following portions of the patient's history were reviewed and updated as appropriate: allergies, current medications, past family history, past medical history, past social history, past surgical history and problem list.   Current Meds:  Outpatient Encounter Medications as of 12/08/2018  Medication Sig  . atenolol (TENORMIN) 25 MG tablet Take 25 mg by mouth daily.  . Calcium Carb-Cholecalciferol (CALCIUM 600 + D PO) Take 1 tablet by mouth 2 (two) times daily.  . Multiple Vitamins-Minerals (MULTIVITAMIN WITH MINERALS) tablet Take 1 tablet by mouth daily.  . vitamin C (  ASCORBIC ACID) 500 MG tablet Take 500 mg by mouth 2 (two) times daily.  . Vitamin D, Ergocalciferol, (DRISDOL) 50000 units CAPS capsule Take 1 capsule (50,000 Units total) by mouth every 7 (seven) days.   No facility-administered encounter medications on file as of 12/08/2018.     Allergy:  Allergies  Allergen Reactions  . Sulfa Antibiotics     "does not remember"    Social Hx:   Social History   Socioeconomic  History  . Marital status: Married    Spouse name: Not on file  . Number of children: Not on file  . Years of education: Not on file  . Highest education level: Not on file  Occupational History  . Not on file  Social Needs  . Financial resource strain: Not on file  . Food insecurity:    Worry: Not on file    Inability: Not on file  . Transportation needs:    Medical: Not on file    Non-medical: Not on file  Tobacco Use  . Smoking status: Never Smoker  . Smokeless tobacco: Never Used  Substance and Sexual Activity  . Alcohol use: Yes    Comment: Socially ~1 / week  . Drug use: No  . Sexual activity: Yes  Lifestyle  . Physical activity:    Days per week: Not on file    Minutes per session: Not on file  . Stress: Not on file  Relationships  . Social connections:    Talks on phone: Not on file    Gets together: Not on file    Attends religious service: Not on file    Active member of club or organization: Not on file    Attends meetings of clubs or organizations: Not on file    Relationship status: Not on file  . Intimate partner violence:    Fear of current or ex partner: Not on file    Emotionally abused: Not on file    Physically abused: Not on file    Forced sexual activity: Not on file  Other Topics Concern  . Not on file  Social History Narrative  . Not on file    Past Surgical Hx:  Past Surgical History:  Procedure Laterality Date  . ABDOMINAL HYSTERECTOMY  09/2011   TAHBSO with appendectomy and optimal tumor debulking  . Arthoscopy Right    Knee  . HAMMER TOE SURGERY Right 2017    Past Medical Hx:  Past Medical History:  Diagnosis Date  . Family history of lymphoma   . Hypertension   . 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 Patient has had a hysterectomy. Menarche: 63 years old P 3 LMP 49 Contraceptive diaphragm  in the 58s for about 3 years HRT none    Family Hx:  Family History  Problem Relation Age of Onset  . Hypertension Mother   . Hypertrophic cardiomyopathy Mother 61       pt negativ rofr gene 08/05/12  . Alzheimer's disease Father   . Cancer Brother        Squamous cell of sinus, hx smok inhalation Engineer, drilling)  . Alzheimer's disease Paternal Grandmother   . Hypertrophic cardiomyopathy Son   . Heart attack Paternal Grandfather 74  . Lymphoma Paternal Uncle        late 50's/early 4's    Review of Systems:  Review of Systems  Constitutional: Negative.   HENT: Negative.   Eyes: Negative.   Respiratory: Negative.   Cardiovascular: Negative.   Gastrointestinal: Negative.   Genitourinary: Negative.   Musculoskeletal: Negative.   Skin: Negative.   Neurological: Negative.   Endo/Heme/Allergies: Negative.   Psychiatric/Behavioral: Negative.  The patient is not nervous/anxious.        Occasionally feels sad.   Vitals:  Blood pressure (!) 159/98, pulse 62, temperature 98.7 F (37.1 C), temperature source Oral, resp. rate 18, height '5\' 6"'$  (1.676 m), weight 177 lb 6.4 oz (80.5 kg), SpO2 100 %. Body mass index is 28.63 kg/m.  Physical Exam: ECOG PERFORMANCE STATUS: 0 - Asymptomatic   General :  Well developed, 63 y.o., female in no apparent distress HEENT:  Normocephalic/atraumatic, symmetric, EOMI, sclera nonicteric Neck:   Supple, no masses.  Lymphatics:  No cervical/ submandibular/ supraclavicular/ infraclavicular/ inguinal adenopathy Respiratory:  Respirations unlabored, no use of accessory muscles CV:   Deferred Breast:  Deferred Musculoskeletal: No CVA tenderness, normal muscle strength. Abdomen:   Soft, non-tender and nondistended. No evidence of hernia. No masses, no fluid wave. Extremities:  No lymphedema, no erythema, non-tender. Skin:   Normal inspection, numerous moles Neuro/Psych:  No focal motor deficit, no abnormal mental status.  Normal affect. Alert and  oriented to person, place, and time  Genito Urinary: Vulva: Normal external female genitalia.  Bladder/urethra: Urethral meatus normal in size and location. No lesions or   masses, well supported bladder Bimanual exam:  Uterus: Surgically absent   Adnexa: No masses.  Ovaries are surgically absent Rectovaginal:  Good tone, no masses, no cul de sac nodularity, no parametrial involvement or nodularity.   Oncologic Summary: 1. History of stage II serous ovarian carcinoma  s/P tumor debulking followed by chemotherapy completing September 2013 2. "BRCA testing" negative   Assessment/Plan: 1. Surveillance plan o We will continue to follow her annually with pelvic and Ca125 2. Return to clinic 1 year for above   Janie Morning, MD  12/06/2018, 11:54 AM   Cc: Dr. Doyle Askew (PCP)

## 2018-12-08 ENCOUNTER — Encounter: Payer: Self-pay | Admitting: Gynecologic Oncology

## 2018-12-08 ENCOUNTER — Inpatient Hospital Stay: Payer: 59

## 2018-12-08 ENCOUNTER — Other Ambulatory Visit: Payer: Self-pay

## 2018-12-08 ENCOUNTER — Inpatient Hospital Stay: Payer: 59 | Attending: Gynecologic Oncology | Admitting: Gynecologic Oncology

## 2018-12-08 VITALS — BP 159/98 | HR 62 | Temp 98.7°F | Resp 18 | Ht 66.0 in | Wt 177.4 lb

## 2018-12-08 DIAGNOSIS — Z8543 Personal history of malignant neoplasm of ovary: Secondary | ICD-10-CM | POA: Diagnosis not present

## 2018-12-08 DIAGNOSIS — Z90710 Acquired absence of both cervix and uterus: Secondary | ICD-10-CM | POA: Diagnosis not present

## 2018-12-08 DIAGNOSIS — Z90722 Acquired absence of ovaries, bilateral: Secondary | ICD-10-CM

## 2018-12-08 DIAGNOSIS — Z9221 Personal history of antineoplastic chemotherapy: Secondary | ICD-10-CM | POA: Diagnosis not present

## 2018-12-08 DIAGNOSIS — Z08 Encounter for follow-up examination after completed treatment for malignant neoplasm: Secondary | ICD-10-CM | POA: Insufficient documentation

## 2018-12-08 DIAGNOSIS — C569 Malignant neoplasm of unspecified ovary: Secondary | ICD-10-CM

## 2018-12-08 NOTE — Patient Instructions (Signed)
Plan to follow up in one year or sooner if needed. Please call our office closer to the date to schedule.

## 2018-12-09 LAB — CA 125: Cancer Antigen (CA) 125: 12.9 U/mL (ref 0.0–38.1)

## 2018-12-14 ENCOUNTER — Telehealth: Payer: Self-pay

## 2018-12-14 NOTE — Telephone Encounter (Signed)
Told Leah Perkins that her CA-125 was WNL at 12.9 per Joylene John, NP.

## 2019-02-13 ENCOUNTER — Encounter: Payer: Self-pay | Admitting: Family Medicine

## 2019-02-13 ENCOUNTER — Other Ambulatory Visit: Payer: Self-pay

## 2019-02-13 ENCOUNTER — Ambulatory Visit: Payer: Managed Care, Other (non HMO) | Admitting: Family Medicine

## 2019-02-13 VITALS — BP 128/84 | HR 60 | Temp 97.9°F | Resp 16 | Ht 66.0 in | Wt 175.5 lb

## 2019-02-13 DIAGNOSIS — Z0000 Encounter for general adult medical examination without abnormal findings: Secondary | ICD-10-CM | POA: Diagnosis not present

## 2019-02-13 DIAGNOSIS — C569 Malignant neoplasm of unspecified ovary: Secondary | ICD-10-CM

## 2019-02-13 DIAGNOSIS — E663 Overweight: Secondary | ICD-10-CM | POA: Diagnosis not present

## 2019-02-13 DIAGNOSIS — Z23 Encounter for immunization: Secondary | ICD-10-CM

## 2019-02-13 DIAGNOSIS — I10 Essential (primary) hypertension: Secondary | ICD-10-CM | POA: Diagnosis not present

## 2019-02-13 DIAGNOSIS — E782 Mixed hyperlipidemia: Secondary | ICD-10-CM | POA: Diagnosis not present

## 2019-02-13 DIAGNOSIS — Z131 Encounter for screening for diabetes mellitus: Secondary | ICD-10-CM

## 2019-02-13 DIAGNOSIS — E785 Hyperlipidemia, unspecified: Secondary | ICD-10-CM | POA: Insufficient documentation

## 2019-02-13 LAB — CBC
HCT: 37 % (ref 36.0–46.0)
Hemoglobin: 12.4 g/dL (ref 12.0–15.0)
MCHC: 33.7 g/dL (ref 30.0–36.0)
MCV: 85.5 fl (ref 78.0–100.0)
Platelets: 251 10*3/uL (ref 150.0–400.0)
RBC: 4.32 Mil/uL (ref 3.87–5.11)
RDW: 14.5 % (ref 11.5–15.5)
WBC: 3.9 10*3/uL — ABNORMAL LOW (ref 4.0–10.5)

## 2019-02-13 LAB — LIPID PANEL
Cholesterol: 191 mg/dL (ref 0–200)
HDL: 69.3 mg/dL (ref >39.00)
LDL Cholesterol: 111 mg/dL — ABNORMAL HIGH (ref 0–99)
NonHDL: 122.1
Total CHOL/HDL Ratio: 3
Triglycerides: 55 mg/dL (ref 0.0–149.0)
VLDL: 11 mg/dL (ref 0.0–40.0)

## 2019-02-13 LAB — COMPREHENSIVE METABOLIC PANEL
ALT: 19 U/L (ref 0–35)
AST: 22 U/L (ref 0–37)
Albumin: 4.2 g/dL (ref 3.5–5.2)
Alkaline Phosphatase: 54 U/L (ref 39–117)
BUN: 25 mg/dL — ABNORMAL HIGH (ref 6–23)
CO2: 30 mEq/L (ref 19–32)
Calcium: 10.2 mg/dL (ref 8.4–10.5)
Chloride: 103 mEq/L (ref 96–112)
Creatinine, Ser: 0.98 mg/dL (ref 0.40–1.20)
GFR: 57.19 mL/min — ABNORMAL LOW (ref >60.00)
Glucose, Bld: 91 mg/dL (ref 70–99)
Potassium: 4.6 mEq/L (ref 3.5–5.1)
Sodium: 140 mEq/L (ref 135–145)
Total Bilirubin: 0.8 mg/dL (ref 0.2–1.2)
Total Protein: 6.7 g/dL (ref 6.0–8.3)

## 2019-02-13 LAB — TSH: TSH: 1.47 u[IU]/mL (ref 0.35–4.50)

## 2019-02-13 LAB — HEMOGLOBIN A1C: Hgb A1c MFr Bld: 5.9 % (ref 4.6–6.5)

## 2019-02-13 MED ORDER — ATENOLOL 25 MG PO TABS: 25.0000 mg | ORAL_TABLET | Freq: Every day | ORAL | 0 refills | Status: DC

## 2019-02-13 MED ORDER — ATENOLOL 25 MG PO TABS: 25.0000 mg | ORAL_TABLET | Freq: Every day | ORAL | 1 refills | Status: DC

## 2019-02-13 NOTE — Patient Instructions (Addendum)
Pleasure meeting you today.    Please help [place] help you:  We are honored you have chosen [facility] for your Primary Care home. Below you will find basic instructions that you may need to access in the future. Please help [place] help you by reading the instructions, which cover many of the frequent questions we experience.   Prescription refills and request:  -In order to allow more efficient response time, please call your pharmacy for all refills. They will forward the request electronically to [place]. This allows for the quickest possible response. Request left on a nurse line can take longer to refill, since these are checked as time allows between office patients and other phone calls.  - refill request can take up to 3-5 working days to complete.  - If request is sent electronically and request is appropiate, it is usually completed in 1-2 business days.  - all patients will need to be seen routinely for all chronic medical conditions requiring prescription medications (see follow-up below). If you are overdue for follow up on your condition, you will be asked to make an appointment and we will call in enough medication to cover you until your appointment (up to 30 days).  - all controlled substances will require a face to face visit to request/refill.  - if you desire your prescriptions to go through a new pharmacy, and have an active script at original pharmacy, you will need to call your pharmacy and have scripts transferred to new pharmacy. This is completed between the pharmacy locations and not by your provider.    Results: If any images or labs were ordered, it can take up to 1 week to get results depending on the test ordered and the lab/facility running and resulting the test. - Normal or stable results, which do not need further discussion, may be released to your mychart immediately with attached note to you. A call may not be generated for normal results. Please make certain to sign  up for mychart. If you have questions on how to activate your mychart you can call the front office.  - If your results need further discussion, our office will attempt to contact you via phone, and if unable to reach you after 2 attempts, we will release your abnormal result to your mychart with instructions.  - All results will be automatically released in mychart after 1 week.  - Your provider will provide you with explanation and instruction on all relevant material in your results. Please keep in mind, results and labs may appear confusing or abnormal to the untrained eye, but it does not mean they are actually abnormal for you personally. If you have any questions about your results that are not covered, or you desire more detailed explanation than what was provided, you should make an appointment with your provider to do so.   Our office handles many outgoing and incoming calls daily. If we have not contacted you within 1 week about your results, please check your mychart to see if there is a message first and if not, then contact our office.  In helping with this matter, you help decrease call volume, and therefore allow [place] to be able to respond to patients needs more efficiently.   Acute office visits (sick visit):  An acute visit is intended for a new problem and are scheduled in shorter time slots to allow schedule openings for patients with new problems. This is the appropriate visit to discuss a new  problem. Problems will not be addressed by phone call or Echart message. Appointment is needed if requesting treatment. In order to provide you with excellent quality medical care with proper time for you to explain your problem, have an exam and receive treatment with instructions, these appointments should be limited to one new problem per visit. If you experience a new problem, in which you desire to be addressed, please make an acute office visit, we save openings on the schedule to accommodate  you. Please do not save your new problem for any other type of visit, let us take care of it properly and quickly for you.   Follow up visits:  Depending on your condition(s) your provider will need to see you routinely in order to provide you with quality care and prescribe medication(s). Most chronic conditions (Example: hypertension, Diabetes, depression/anxiety... etc), require visits a couple times a year. Your provider will instruct you on proper follow up for your personal medical conditions and history. Please make certain to make follow up appointments for your condition as instructed. Failing to do so could result in lapse in your medication treatment/refills. If you request a refill, and are overdue to be seen on a condition, we will always provide you with a 30 day script (once) to allow you time to schedule.    Medicare wellness (well visit): - we have a wonderful Nurse Maudie Mercury), that will meet with you and provide you will yearly medicare wellness visits. These visits should occur yearly (can not be scheduled less than 1 calendar year apart) and cover preventive health, immunizations, advance directives and screenings you are entitled to yearly through your medicare benefits. Do not miss out on your entitled benefits, this is when medicare will pay for these benefits to be ordered for you.  These are strongly encouraged by your provider and is the appropriate type of visit to make certain you are up to date with all preventive health benefits. If you have not had your medicare wellness exam in the last 12 months, please make certain to schedule one by calling the office and schedule your medicare wellness with Maudie Mercury as soon as possible.   Yearly physical (well visit):  - Adults are recommended to be seen yearly for physicals. Check with your insurance and date of your last physical, most insurances require one calendar year between physicals. Physicals include all preventive health topics,  screenings, medical exam and labs that are appropriate for gender/age and history. You may have fasting labs needed at this visit. This is a well visit (not a sick visit), new problems should not be covered during this visit (see acute visit).  - Pediatric patients are seen more frequently when they are younger. Your provider will advise you on well child visit timing that is appropriate for your their age. - This is not a medicare wellness visit. Medicare wellness exams do not have an exam portion to the visit. Some medicare companies allow for a physical, some do not allow a yearly physical. If your medicare allows a yearly physical you can schedule the medicare wellness with our nurse Maudie Mercury and have your physical with your provider after, on the same day. Please check with insurance for your full benefits.   Late Policy/No Shows:  - all new patients should arrive 15-30 minutes earlier than appointment to allow [place] time  to  obtain all personal demographics,  insurance information and for you to complete office paperwork. - All established patients should arrive 10-15 minutes  earlier than appointment time to update all information and be checked in .  - In our best efforts to run on time, if you are late for your appointment you will be asked to either reschedule or if able, we will work you back into the schedule. There will be a wait time to work you back in the schedule,  depending on availability.  - If you are unable to make it to your appointment as scheduled, please call 24 hours ahead of time to allow [place] to fill the time slot with someone else who needs to be seen. If you do not cancel your appointment ahead of time, you may be charged a no show fee.    Health Maintenance, Female Adopting a healthy lifestyle and getting preventive care are important in promoting health and wellness. Ask your health care provider about:  The right schedule for you to have regular tests and exams.  Things you  can do on your own to prevent diseases and keep yourself healthy. What should I know about diet, weight, and exercise? Eat a healthy diet   Eat a diet that includes plenty of vegetables, fruits, low-fat dairy products, and lean protein.  Do not eat a lot of foods that are high in solid fats, added sugars, or sodium. Maintain a healthy weight Body mass index (BMI) is used to identify weight problems. It estimates body fat based on height and weight. Your health care provider can help determine your BMI and help you achieve or maintain a healthy weight. Get regular exercise Get regular exercise. This is one of the most important things you can do for your health. Most adults should:  Exercise for at least 150 minutes each week. The exercise should increase your heart rate and make you sweat (moderate-intensity exercise).  Do strengthening exercises at least twice a week. This is in addition to the moderate-intensity exercise.  Spend less time sitting. Even light physical activity can be beneficial. Watch cholesterol and blood lipids Have your blood tested for lipids and cholesterol at 64 years of age, then have this test every 5 years. Have your cholesterol levels checked more often if:  Your lipid or cholesterol levels are high.  You are older than 64 years of age.  You are at high risk for heart disease. What should I know about cancer screening? Depending on your health history and family history, you may need to have cancer screening at various ages. This may include screening for:  Breast cancer.  Cervical cancer.  Colorectal cancer.  Skin cancer.  Lung cancer. What should I know about heart disease, diabetes, and high blood pressure? Blood pressure and heart disease  High blood pressure causes heart disease and increases the risk of stroke. This is more likely to develop in people who have high blood pressure readings, are of African descent, or are overweight.  Have  your blood pressure checked: ? Every 3-5 years if you are 44-29 years of age. ? Every year if you are 108 years old or older. Diabetes Have regular diabetes screenings. This checks your fasting blood sugar level. Have the screening done:  Once every three years after age 86 if you are at a normal weight and have a low risk for diabetes.  More often and at a younger age if you are overweight or have a high risk for diabetes. What should I know about preventing infection? Hepatitis B If you have a higher risk for hepatitis B, you should be  screened for this virus. Talk with your health care provider to find out if you are at risk for hepatitis B infection. Hepatitis C Testing is recommended for:  Everyone born from [birth year] through 1966.  Anyone with known risk factors for hepatitis C. Sexually transmitted infections (STIs)  Get screened for STIs, including gonorrhea and chlamydia, if: ? You are sexually active and are younger than 64 years of age. ? You are older than 64 years of age and your health care provider tells you that you are at risk for this type of infection. ? Your sexual activity has changed since you were last screened, and you are at increased risk for chlamydia or gonorrhea. Ask your health care provider if you are at risk.  Ask your health care provider about whether you are at high risk for HIV. Your health care provider may recommend a prescription medicine to help prevent HIV infection. If you choose to take medicine to prevent HIV, you should first get tested for HIV. You should then be tested every 3 months for as long as you are taking the medicine. Pregnancy  If you are about to stop having your period (premenopausal) and you may become pregnant, seek counseling before you get pregnant.  Take 400 to 800 micrograms (mcg) of folic acid every day if you become pregnant.  Ask for birth control (contraception) if you want to prevent pregnancy. Osteoporosis and  menopause Osteoporosis is a disease in which the bones lose minerals and strength with aging. This can result in bone fractures. If you are 7 years old or older, or if you are at risk for osteoporosis and fractures, ask your health care provider if you should:  Be screened for bone loss.  Take a calcium or vitamin D supplement to lower your risk of fractures.  Be given hormone replacement therapy (HRT) to treat symptoms of menopause. Follow these instructions at home: Lifestyle  Do not use any products that contain nicotine or tobacco, such as cigarettes, e-cigarettes, and chewing tobacco. If you need help quitting, ask your health care provider.  Do not use street drugs.  Do not share needles.  Ask your health care provider for help if you need support or information about quitting drugs. Alcohol use  Do not drink alcohol if: ? Your health care provider tells you not to drink. ? You are pregnant, may be pregnant, or are planning to become pregnant.  If you drink alcohol: ? Limit how much you use to 0-1 drink a day. ? Limit intake if you are breastfeeding.  Be aware of how much alcohol is in your drink. In the [place] one drink equals one 12 oz bottle of beer (355 mL), one 5 oz glass of wine (148 mL), or one 1 oz glass of hard liquor (44 mL). General instructions  Schedule regular health, dental, and eye exams.  Stay current with your vaccines.  Tell your health care provider if: ? You often feel depressed. ? You have ever been abused or do not feel safe at home. Summary  Adopting a healthy lifestyle and getting preventive care are important in promoting health and wellness.  Follow your health care provider's instructions about healthy diet, exercising, and getting tested or screened for diseases.  Follow your health care provider's instructions on monitoring your cholesterol and blood pressure. This information is not intended to replace advice given to you by your  health care provider. Make sure you discuss any questions you have with your health  care provider. Document Released: 01/12/2011 Document Revised: 06/22/2018 Document Reviewed: 06/22/2018 Elsevier Patient Education  2020 [employer].

## 2019-02-13 NOTE — Progress Notes (Signed)
Patient ID: Leah Perkins, female  DOB: [birth year], 63 y.o.   MRN: [id number] Patient Care Team    Relationship Specialty Notifications Start End  Ma Hillock, DO PCP - General Family Medicine  02/13/19   Arta Silence, MD PCP - Gastroenterology   02/13/19 02/13/19  Janie Morning, MD Attending Physician Obstetrics and Gynecology  02/13/19   Lyndal Pulley, [place] Physician Sports Medicine  02/13/19     Chief Complaint  Patient presents with  . Establish Care    Pt is due for CPE and will need refills soon on Atenolol, needs short supply [facility] Dr Olen Pel was PCP.  Dr Skeet Latch from [facility].     Subjective:  Leah Perkins is a 63 y.o.  female present for new patient establishment. All past medical history, surgical history, allergies, family history, immunizations, medications and social history were updated in the electronic medical record today. All recent labs, ED visits and hospitalizations within the last year were reviewed.  Hypertension/HLD: (non-fasting) Pt reports compliance with atenolol. Blood pressures ranges at home WNL. Patient denies chest pain, shortness of breath or lower extremity edema Labs updated today Diet: low sodium Exercise: routine RF: htn, hld, overweight, fhx.   Health maintenance:  Colonoscopy: completed 2019, by Dr. Paulita Fujita, resutls "normal"  follow up 3-5 years. Mammogram: completed:08/2018- has gyn Cervical cancer screening: hysterectomy- for ovarian cancer>>continue to follow with gyn Immunizations: tdap 2015 UTD, Influenza  (encouraged yearly), shingrix #1 provided today Infectious disease screening: HIV and Hep C pt reported she had completed.  DEXA: completed 04/07/2016- WNL Assistive device: none Oxygen URK:YHCW Patient has a Dental home. Hospitalizations/ED visits:reviewed  Depression screen [facility] 2/9 02/13/2019  Decreased Interest 0  Down, Depressed, Hopeless 0  PHQ - 2 Score 0   No  flowsheet data found.    No flowsheet data found. Immunization History  Administered Date(s) Administered  . Zoster Recombinat (Shingrix) 02/13/2019    No exam data present  Past Medical History:  Diagnosis Date  . Allergy   . Chicken pox   . Hypertension   . 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 UTI (urinary tract infection)    Allergies  Allergen Reactions  . Lisinopril     Decreased renal fx while on- resolved when dc'd  . Sulfa Antibiotics     "does not remember"   Past Surgical History:  Procedure Laterality Date  . ABDOMINAL HYSTERECTOMY  09/2011   TAHBSO with appendectomy and optimal tumor debulking  . APPENDECTOMY  2009   w/ hysterectomy  . DILATION AND CURETTAGE OF UTERUS  1996  . HAMMER TOE SURGERY Right 2017  . KNEE ARTHROSCOPY Right 2009   Knee  . TONSILLECTOMY  1991  . WISDOM TOOTH EXTRACTION  1985   Family History  Problem Relation Age of Onset  . Hypertension Mother   . Hypertrophic cardiomyopathy Mother 75       pt negativ rofr gene 17-Aug-2012  . Early death Mother   . Alzheimer's disease Father   . Cancer Brother        Squamous cell of sinus, hx smok inhalation Engineer, drilling)  . Early death Brother   . Alzheimer's disease Paternal Grandmother   . Hypertrophic cardiomyopathy Son   . Early death Son   . Heart attack Paternal Grandfather 74  . Lymphoma Paternal Uncle  late 50's/early 99's  . Miscarriages / [place] Daughter   . Heart attack Brother   . Early death Son    Social History   Social History Narrative   Marital status/children/pets: Married, 3 children. Has grandchildren   Education/employment: [employer] retired   Engineer, materials:      -Wears a bicycle helmet riding a bike: Yes     -smoke alarm in the home:Yes     - wears seatbelt: Yes     - Feels safe in their relationships: Yes    Allergies as of 02/13/2019      Reactions   Lisinopril    Decreased renal fx while  on- resolved when dc'd   Sulfa Antibiotics    "does not remember"      Medication List       Accurate as of February 13, 2019 11:45 AM. If you have any questions, ask your nurse or doctor.        atenolol 25 MG tablet Commonly known as: TENORMIN Take 1 tablet (25 mg total) by mouth daily. What changed: Another medication with the same name was added. Make sure you understand how and when to take each. Changed by: Howard Pouch, DO   atenolol 25 MG tablet Commonly known as: TENORMIN Take 1 tablet (25 mg total) by mouth daily. What changed: You were already taking a medication with the same name, and this prescription was added. Make sure you understand how and when to take each. Changed by: Howard Pouch, DO   CALCIUM 600 + D PO Take 1 tablet by mouth 2 (two) times daily.   multivitamin with minerals tablet Take 1 tablet by mouth daily.   vitamin C 500 MG tablet Commonly known as: ASCORBIC ACID Take 500 mg by mouth 2 (two) times daily.       All past medical history, surgical history, allergies, family history, immunizations andmedications were updated in the EMR today and reviewed under the history and medication portions of their EMR.    Recent Results (from the past 2160 hour(s))  CA 125     Status: None   Collection Time: 12/08/18 10:24 AM  Result Value Ref Range   Cancer Antigen (CA) 125 12.9 0.0 - 38.1 U/mL    Comment: (NOTE) Roche Diagnostics Electrochemiluminescence Immunoassay (ECLIA) Values obtained with different assay methods or kits cannot be used interchangeably.  Results cannot be interpreted as absolute evidence of the presence or absence of malignant disease. Performed At: [facility] [place], [place] [id number] Rush Farmer MD [id number]     ROS: 14 pt review of systems performed and negative (unless mentioned in an HPI)  Objective: BP 128/84 (BP Location: Left Arm, Patient Position: Sitting, Cuff Size: Normal)   Pulse  60   Temp 97.9 F (36.6 C) (Temporal)   Resp 16   Ht '5\' 6"'$  (1.676 m)   Wt 175 lb 8 oz (79.6 kg)   BMI 28.33 kg/m  Gen: Afebrile. No acute distress. Nontoxic in appearance, well-developed, well-nourished,  Pleasant, caucasian female. Overweight.  HENT: AT. [place]. Bilateral TM visualized and normal in appearance, normal external auditory canal. MMM, no oral lesions, adequate dentition. Bilateral nares within normal limits. Throat without erythema, ulcerations or exudates. no Cough on exam, no hoarseness on exam. Eyes:Pupils Equal Round Reactive to light, Extraocular movements intact,  Conjunctiva without redness, discharge or icterus. Neck/lymp/endocrine: Supple,no lymphadenopathy, no thyromegaly CV: RRR no murmur, no edema, +2/4 P posterior tibialis pulses. no carotid bruits. No JVD. Chest:  CTAB, no wheeze, rhonchi or crackles. normal Respiratory effort. good Air movement. Abd: Soft. flat. NTND. BS present. no Masses palpated. No hepatosplenomegaly. No rebound tenderness or guarding. Skin: no rashes, purpura or petechiae. Warm and well-perfused. Skin intact. Neuro/Msk:  Normal gait. PERLA. EOMi. Alert. Oriented x3.  Cranial nerves II through XII intact. Muscle strength 5/5 upper/lower extremity. DTRs equal bilaterally. Psych: Normal affect, dress and demeanor. Normal speech. Normal thought content and judgment.   Assessment/plan: Leah Perkins is a 63 y.o. female present for est/cpe Overweight (BMI 25.0-29.9) Diet and exercise> walking daily.  Malignant neoplasm of ovary, unspecified laterality ([place]) - follows with gyn-onc Essential hypertension/mixed hyperlipidemia Stable. Refills provided today.  - atenolol (TENORMIN) 25 MG tablet; Take 1 tablet (25 mg total) by mouth daily.  Dispense: 30 tablet; Refill: 0 - prior med: lisinopril caused decreased kidney fx. Lipitor.  - CBC - Comp Met (CMET) - TSH - Lipid panel - f/u 6 mos  Diabetes mellitus screening - Hemoglobin A1c  Encounter  for preventive health examination Patient was encouraged to exercise greater than 150 minutes a week. Patient was encouraged to choose a diet filled with fresh fruits and vegetables, and lean meats. AVS provided to patient today for education/recommendation on gender specific health and safety maintenance. Colonoscopy: completed 2019, by Dr. Paulita Fujita, resutls "normal"  follow up 3-5 years. Mammogram: completed:08/2018- has gyn Cervical cancer screening: hysterectomy- for ovarian cancer>>continue to follow with gyn Immunizations: tdap 2015 UTD, Influenza  (encouraged yearly), shingrix #1 provided today Infectious disease screening: HIV and Hep C pt reported she had completed.  DEXA: completed 04/07/2016- WNL  Follow ups: Return in about 1 year (around 02/13/2020) for CPE (30 min). 6 mos [place] 3 mos nurse visit for shingrix #2  Note is dictated utilizing voice recognition software. Although note has been proof read prior to signing, occasional typographical errors still can be missed. If any questions arise, please do not hesitate to call for verification.  Electronically signed by: Howard Pouch, DO [place]

## 2019-05-02 ENCOUNTER — Other Ambulatory Visit: Payer: Self-pay

## 2019-05-02 ENCOUNTER — Ambulatory Visit (INDEPENDENT_AMBULATORY_CARE_PROVIDER_SITE_OTHER): Payer: Managed Care, Other (non HMO)

## 2019-05-02 DIAGNOSIS — Z23 Encounter for immunization: Secondary | ICD-10-CM

## 2019-05-16 ENCOUNTER — Other Ambulatory Visit: Payer: Self-pay

## 2019-05-16 ENCOUNTER — Ambulatory Visit (INDEPENDENT_AMBULATORY_CARE_PROVIDER_SITE_OTHER): Payer: Managed Care, Other (non HMO)

## 2019-05-16 DIAGNOSIS — Z23 Encounter for immunization: Secondary | ICD-10-CM

## 2019-07-24 ENCOUNTER — Ambulatory Visit: Payer: Managed Care, Other (non HMO) | Attending: Internal Medicine

## 2019-07-24 DIAGNOSIS — Z23 Encounter for immunization: Secondary | ICD-10-CM | POA: Diagnosis not present

## 2019-07-24 NOTE — Progress Notes (Signed)
   Covid-19 Vaccination Clinic  Name:  Leah Perkins    MRN: [id number] DOB: 02/01/1956  07/24/2019  Ms. Rzonca was observed post Covid-19 immunization for 15 minutes without incidence. She was provided with Vaccine Information Sheet and instruction to access the V-Safe system.   Ms. Skidmore was instructed to call 911 with any severe reactions post vaccine: Marland Kitchen Difficulty breathing  . Swelling of your face and throat  . A fast heartbeat  . A bad rash all over your body  . Dizziness and weakness

## 2019-07-25 ENCOUNTER — Other Ambulatory Visit: Payer: Self-pay | Admitting: Family Medicine

## 2019-08-11 ENCOUNTER — Ambulatory Visit: Payer: Managed Care, Other (non HMO)

## 2019-08-12 ENCOUNTER — Ambulatory Visit: Payer: Managed Care, Other (non HMO) | Attending: Internal Medicine

## 2019-08-12 DIAGNOSIS — Z23 Encounter for immunization: Secondary | ICD-10-CM

## 2019-08-12 NOTE — Progress Notes (Signed)
   Covid-19 Vaccination Clinic  Name:  Leah Perkins    MRN: [id number] DOB: 1955/11/20  08/12/2019  [name] was observed post Covid-19 immunization for 15 minutes without incidence. She was provided with Vaccine Information Sheet and instruction to access the V-Safe system.   [name] was instructed to call 911 with any severe reactions post vaccine: Marland Kitchen Difficulty breathing  . Swelling of your face and throat  . A fast heartbeat  . A bad rash all over your body  . Dizziness and weakness    Immunizations Administered    Name Date Dose VIS Date Route   Pfizer COVID-19 Vaccine 08/12/2019 10:47 AM 0.3 mL 06/23/2019 Intramuscular   Manufacturer: [place]   Lot: [id number]   [place]: [id number]

## 2019-08-15 ENCOUNTER — Ambulatory Visit (INDEPENDENT_AMBULATORY_CARE_PROVIDER_SITE_OTHER): Payer: Managed Care, Other (non HMO) | Admitting: Family Medicine

## 2019-08-15 ENCOUNTER — Encounter: Payer: Self-pay | Admitting: Family Medicine

## 2019-08-15 ENCOUNTER — Other Ambulatory Visit: Payer: Self-pay

## 2019-08-15 VITALS — BP 123/79 | HR 57 | Temp 97.7°F | Resp 16 | Ht 66.0 in | Wt 161.4 lb

## 2019-08-15 DIAGNOSIS — E663 Overweight: Secondary | ICD-10-CM

## 2019-08-15 DIAGNOSIS — I10 Essential (primary) hypertension: Secondary | ICD-10-CM

## 2019-08-15 DIAGNOSIS — E782 Mixed hyperlipidemia: Secondary | ICD-10-CM

## 2019-08-15 MED ORDER — ATENOLOL 25 MG PO TABS: 25.0000 mg | ORAL_TABLET | Freq: Every day | ORAL | 1 refills | Status: DC

## 2019-08-15 NOTE — Patient Instructions (Signed)
Great to see you today.  We will see you in June for your physical and labs collected then.

## 2019-08-15 NOTE — Progress Notes (Signed)
Patient ID: Leah Perkins, female  DOB: 1955-07-21, 64 y.o.   MRN: [id number] Patient Care Team    Relationship Specialty Notifications Start End  Ma Hillock, DO PCP - General Family Medicine  02/13/19   Janie Morning, MD Attending Physician Obstetrics and Gynecology  02/13/19   Lyndal Pulley, [place] Physician Sports Medicine  02/13/19     Chief Complaint  Patient presents with  . Hypertension    Needs refills on medications. Doing well with no complaints     Subjective: Leah Perkins is a 64 y.o.  female present for [facility] Hypertension/HLD:  Pt reports compliance with atenolol. Blood pressures ranges at home WNL. Patient denies chest pain, shortness of breath, dizziness or lower extremity edema.  Labs UTD Diet: low sodium Exercise: routine RF: htn, hld, overweight, fhx.   Pt has had both of her covid vaccines.   Depression screen [facility] 2/9 08/15/2019 02/13/2019  Decreased Interest 0 0  Down, Depressed, Hopeless 0 0  PHQ - 2 Score 0 0   No flowsheet data found.    No flowsheet data found. Immunization History  Administered Date(s) Administered  . Influenza,inj,Quad PF,6+ Mos 05/02/2019  . PFIZER SARS-COV-2 Vaccination 07/24/2019, 08/12/2019  . Zoster Recombinat (Shingrix) 02/13/2019, 05/16/2019    No exam data present  Past Medical History:  Diagnosis Date  . Allergy   . Chicken pox   . Hypertension   . 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 UTI (urinary tract infection)    Allergies  Allergen Reactions  . Lisinopril     Decreased renal fx while on- resolved when dc'd  . Sulfa Antibiotics     "does not remember"   Past Surgical History:  Procedure Laterality Date  . ABDOMINAL HYSTERECTOMY  09/2011   TAHBSO with appendectomy and optimal tumor debulking  . APPENDECTOMY  2009   w/ hysterectomy  . DILATION AND CURETTAGE OF UTERUS  1996  . HAMMER TOE SURGERY Right 2017  . KNEE  ARTHROSCOPY Right 2009   Knee  . TONSILLECTOMY  1991  . WISDOM TOOTH EXTRACTION  1985   Family History  Problem Relation Age of Onset  . Hypertension Mother   . Hypertrophic cardiomyopathy Mother 68       pt negativ rofr gene 08-11-12  . Early death Mother   . Alzheimer's disease Father   . Cancer Brother        Squamous cell of sinus, hx smok inhalation Engineer, drilling)  . Early death Brother   . Alzheimer's disease Paternal Grandmother   . Hypertrophic cardiomyopathy Son   . Early death Son   . Heart attack Paternal Grandfather 74  . Lymphoma Paternal Uncle        late 50's/early 65's  . Miscarriages / [place] Daughter   . Heart attack Brother   . Early death Son    Social History   Social History Narrative   Marital status/children/pets: Married, 3 children. Has grandchildren   Education/employment: A.A.S., retired   Engineer, materials:      -Wears a bicycle helmet riding a bike: Yes     -smoke alarm in the home:Yes     - wears seatbelt: Yes     - Feels safe in their relationships: Yes    Allergies as of 08/15/2019      Reactions   Lisinopril    Decreased renal fx while on- resolved when dc'd  Sulfa Antibiotics    "does not remember"      Medication List       Accurate as of August 15, 2019 10:25 AM. If you have any questions, ask your nurse or doctor.        atenolol 25 MG tablet Commonly known as: TENORMIN Take 1 tablet (25 mg total) by mouth daily. What changed: Another medication with the same name was removed. Continue taking this medication, and follow the directions you see here. Changed by: Howard Pouch, DO   CALCIUM 600 + D PO Take 1 tablet by mouth 2 (two) times daily.   multivitamin with minerals tablet Take 1 tablet by mouth daily.   vitamin C 500 MG tablet Commonly known as: ASCORBIC ACID Take 500 mg by mouth 2 (two) times daily.       All past medical history, surgical history, allergies, family history, immunizations andmedications were  updated in the EMR today and reviewed under the history and medication portions of their EMR.    No results found for this or any previous visit (from the past 2160 hour(s)).  ROS: 14 pt review of systems performed and negative (unless mentioned in an HPI)  Objective: BP 123/79 (BP Location: Left Arm, Patient Position: Sitting, Cuff Size: Normal)   Pulse (!) 57   Temp 97.7 F (36.5 C) (Temporal)   Resp 16   Ht 5\' 6"  (1.676 m)   Wt 161 lb 6 oz (73.2 kg)   SpO2 100%   BMI 26.05 kg/m  Gen: Afebrile. No acute distress.  HENT: AT. [place]. Eyes:Pupils Equal Round Reactive to light, Extraocular movements intact,  Conjunctiva without redness, discharge or icterus. CV: RRR no murmur, no edema, +2/4 P posterior tibialis pulses Chest: CTAB, no wheeze or crackles  Neuro: Normal gait. PERLA. EOMi. Alert. Oriented x3 Psych: Normal affect, dress and demeanor. Normal speech. Normal thought content and judgment.  Assessment/plan: SARAIYAH Perkins is a 64 y.o. female present for [facility] Essential hypertension/mixed hyperlipidemia - stable. Continue atenolol.  - prior med: lisinopril caused decreased kidney fx. Lipitor.  - labs UTD - f/u 5-6 mos (CPe due 8/4- scheduled)  FNo orders of the defined types were placed in this encounter.  Meds ordered this encounter  Medications  . atenolol (TENORMIN) 25 MG tablet    Sig: Take 1 tablet (25 mg total) by mouth daily.    Dispense:  90 tablet    Refill:  1    Note is dictated utilizing voice recognition software. Although note has been proof read prior to signing, occasional typographical errors still can be missed. If any questions arise, please do not hesitate to call for verification.  Electronically signed by: Howard Pouch, DO [place]

## 2019-08-16 ENCOUNTER — Other Ambulatory Visit: Payer: Self-pay | Admitting: Family Medicine

## 2019-08-16 DIAGNOSIS — Z1231 Encounter for screening mammogram for malignant neoplasm of breast: Secondary | ICD-10-CM

## 2019-08-17 ENCOUNTER — Other Ambulatory Visit: Payer: Self-pay

## 2019-08-17 ENCOUNTER — Ambulatory Visit
Admission: RE | Admit: 2019-08-17 | Discharge: 2019-08-17 | Disposition: A | Discharge status: Home / Self Care | Payer: Managed Care, Other (non HMO) | Source: Ambulatory Visit

## 2019-08-17 DIAGNOSIS — Z1231 Encounter for screening mammogram for malignant neoplasm of breast: Secondary | ICD-10-CM

## 2019-10-04 IMAGING — MG DIGITAL SCREENING BILATERAL MAMMOGRAM WITH CAD
4 series · 4 of 4 positions shown · non-contrast
Comparison: Previous exam(s).

CLINICAL DATA: Screening.

EXAM:
DIGITAL SCREENING BILATERAL MAMMOGRAM WITH CAD

[L CC]
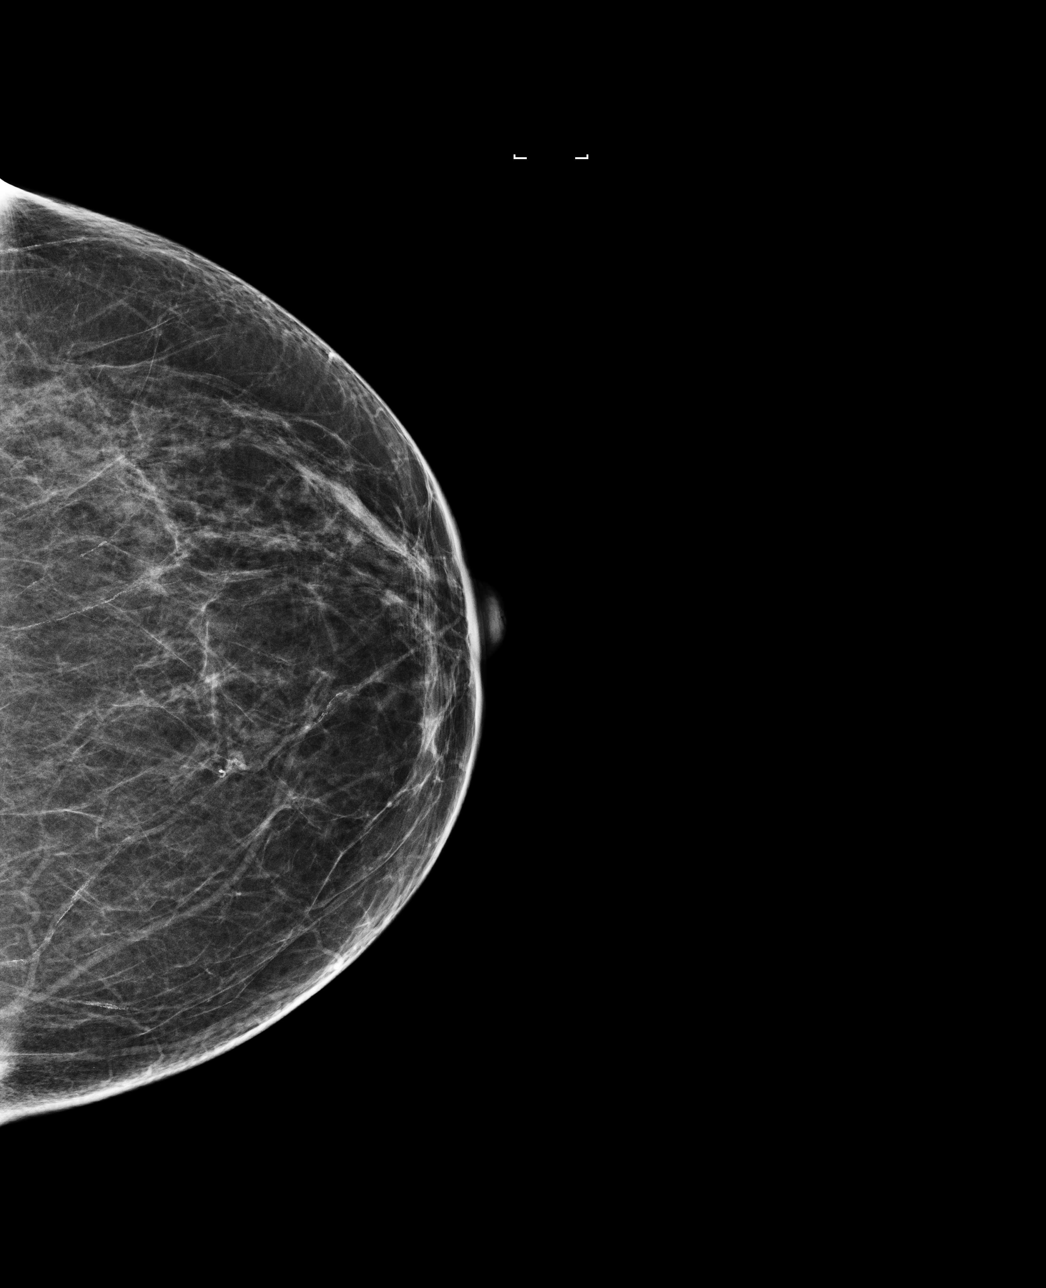

[R CC]
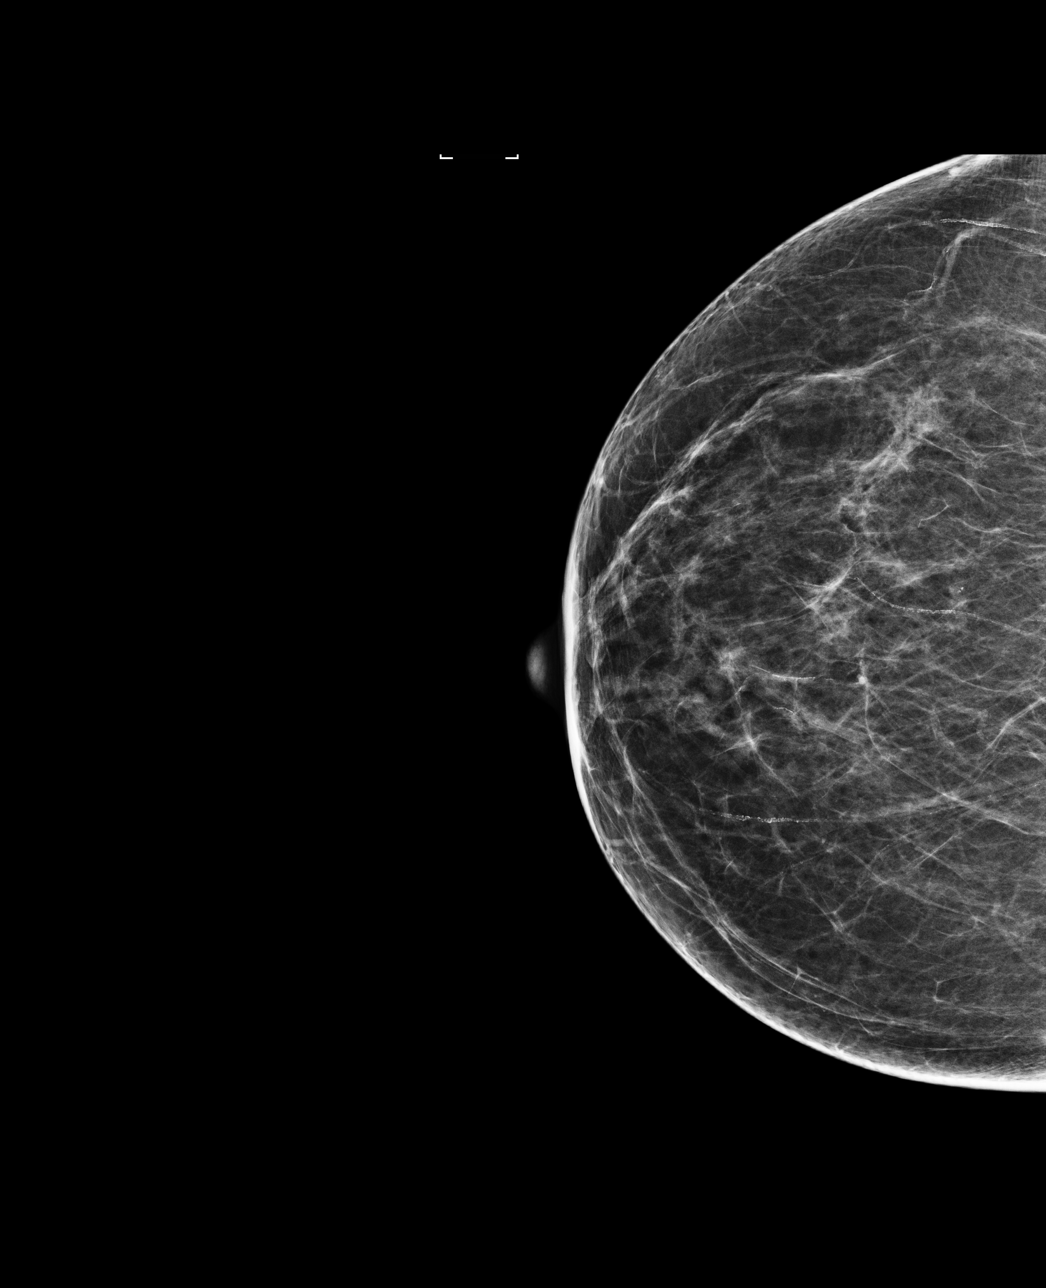

[L MLO]
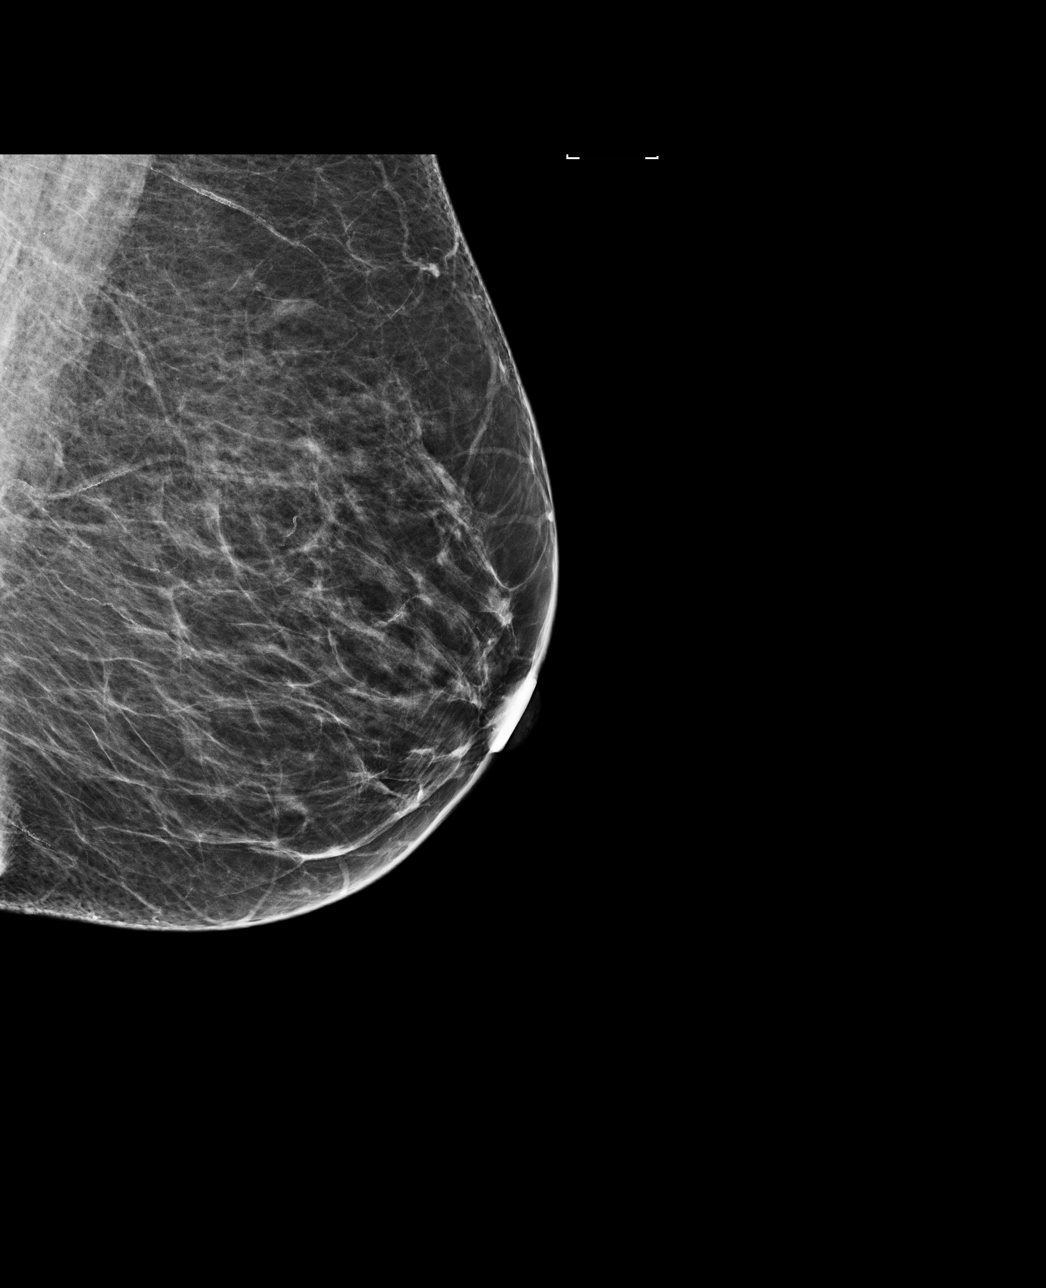

[R MLO]
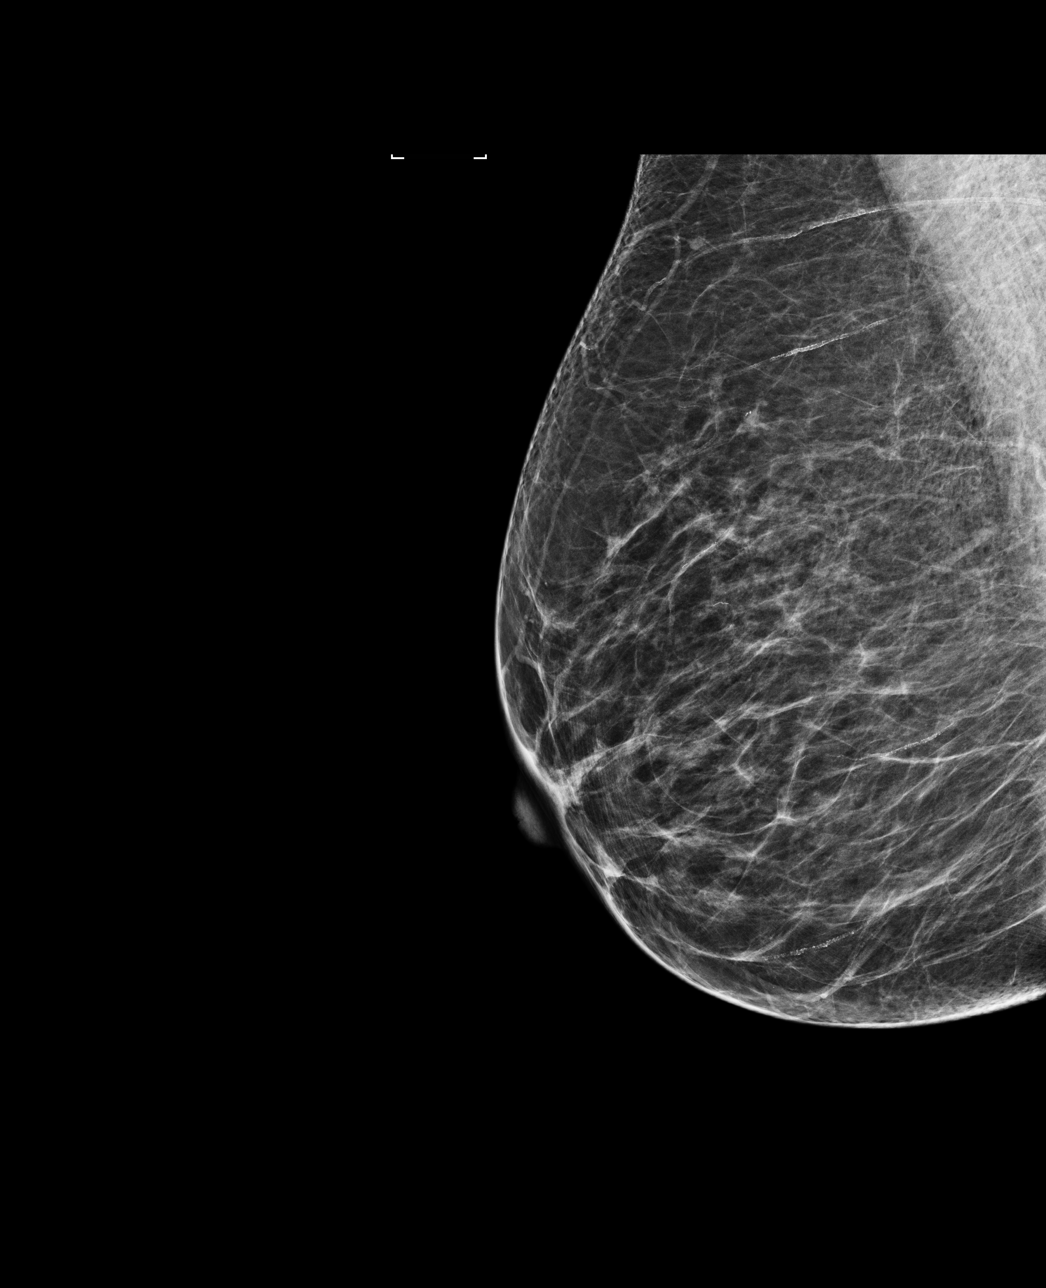

[4 of 4 positions shown; findings below may reference images not displayed]

ACR Breast Density Category b: There are scattered areas of
fibroglandular density.
FINDINGS: There are no findings suspicious for malignancy. Images were
processed with CAD.
IMPRESSION: No mammographic evidence of malignancy. A result letter of this
screening mammogram will be mailed directly to the patient.

RECOMMENDATION:
Screening mammogram in one year. (Code:AS-G-LCT)

BI-RADS CATEGORY  1: Negative.

## 2019-11-14 NOTE — Progress Notes (Signed)
Follow Up Note: Gyn-Onc  SMITH MCNICHOLAS 64 y.o. female  CC: She presents for a follow-up visit  HPI:  Oncology History  Ovarian cancer f/u  09/23/2011 Initial Diagnosis   IIC serous ovarian cancer treated in [place]    - 03/21/2012 Chemotherapy   s/p 6 cycles of dose dense with paclitaxel and carboplatin   12/08/2017 Genetic Testing   The Common Hereditary Cancers panel + Myelodysplastic syndrome/Leukemia panel was ordered. The following genes were evaluated for sequence changes and exonic deletions/duplications: APC, ATM, AXIN2, BARD1, BLM, BMPR1A, BRCA1, BRCA2, BRIP1, CDH1, CDK4, CDKN2A (p14ARF), CDKN2A (p16INK4a), CEBPA,CHEK2, CTNNA1, DICER1, EPCAM*, GATA2, GREM1*, HRAS, KIT, MEN1, MLH1, MSH2, MSH3, MSH6, MUTYH, NBN, NF1, PALB2, PDGFRA, PMS2, POLD1, POLE, PTEN, RAD50, RAD51C, RAD51D, RUNX1, SDHB, SDHC, SDHD, SMAD4, SMARCA4, STK11, TERC, TERT, TP53, TSC1, TSC2, VHL. The following genes were evaluated for sequence changes only: HOXB13*, NTHL1*, SDHA  Results: Negative, no pathogenic variants identified.  The date of this test report is 12/08/2017.      Interval History: She noticed a "bulge" in the right groin yesterday.  It is non-tender.  She denies any abdominal pain/distenstion, weight loss or change in bowl habits.   Review of Systems  Review of Systems  Constitutional: Positive for weight loss (intentional). Negative for malaise/fatigue.  Gastrointestinal: Negative for abdominal pain, constipation, diarrhea, nausea and vomiting.    Current Meds:  Outpatient Encounter Medications as of 11/15/2019  Medication Sig  . atenolol (TENORMIN) 25 MG tablet Take 1 tablet (25 mg total) by mouth daily.  . Calcium Carb-Cholecalciferol (CALCIUM 600 + D PO) Take 1 tablet by mouth 2 (two) times daily.  . Multiple Vitamins-Minerals (MULTIVITAMIN WITH MINERALS) tablet Take 1 tablet by mouth daily.  . vitamin C (ASCORBIC  ACID) 500 MG tablet Take 500 mg by mouth 2 (two) times daily.   No facility-administered encounter medications on file as of 11/15/2019.    Allergy:  Allergies  Allergen Reactions  . Lisinopril     Decreased renal fx while on- resolved when dc'd  . Sulfa Antibiotics     "does not remember"    Social Hx:   Social History   Socioeconomic History  . Marital status: Married    Spouse name: Not on file  . Number of children: Not on file  . Years of education: Not on file  . Highest education level: Not on file  Occupational History  . Not on file  Tobacco Use  . Smoking status: Never Smoker  . Smokeless tobacco: Never Used  Substance and Sexual Activity  . Alcohol use: Yes    Comment: Socially ~1 / week  . Drug use: No  . Sexual activity: Yes    Partners: Male  Other Topics Concern  . Not on file  Social History Narrative   Marital status/children/pets: Married, 3 children. Has grandchildren   Education/employment: A.A.S., retired   Engineer, materials:      -Wears a bicycle helmet riding a bike: Yes     -smoke alarm in the home:Yes     - wears seatbelt: Yes     - Feels safe in their relationships: Yes   Social Determinants of Health   Financial Resource Strain:   . Difficulty of Paying Living Expenses:   Food Insecurity:   . Worried About Charity fundraiser in the Last Year:   . Arboriculturist in the Last Year:   Transportation Needs:   . Film/video editor (Medical):   Marland Kitchen Lack of  Transportation (Non-Medical):   Physical Activity:   . Days of Exercise per Week:   . Minutes of Exercise per Session:   Stress:   . Feeling of Stress :   Social Connections:   . Frequency of Communication with Friends and Family:   . Frequency of Social Gatherings with Friends and Family:   . Attends Religious Services:   . Active Member of Clubs or Organizations:   . Attends Archivist Meetings:   Marland Kitchen Marital Status:   Intimate Partner Violence:   . Fear of Current or  Ex-Partner:   . Emotionally Abused:   Marland Kitchen Physically Abused:   . Sexually Abused:     Past Surgical Hx:  Past Surgical History:  Procedure Laterality Date  . ABDOMINAL HYSTERECTOMY  09/2011   TAHBSO with appendectomy and optimal tumor debulking  . APPENDECTOMY  2009   w/ hysterectomy  . DILATION AND CURETTAGE OF UTERUS  1996  . HAMMER TOE SURGERY Right 2017  . KNEE ARTHROSCOPY Right 2009   Knee  . TONSILLECTOMY  1991  . WISDOM TOOTH EXTRACTION  1985    Past Medical Hx:  Past Medical History:  Diagnosis Date  . Allergy   . Chicken pox   . Hypertension   . 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 UTI (urinary tract infection)     Family Hx:  Family History  Problem Relation Age of Onset  . Hypertension Mother   . Hypertrophic cardiomyopathy Mother 39       pt negativ rofr gene 2012-08-22  . Early death Mother   . Alzheimer's disease Father   . Cancer Brother        Squamous cell of sinus, hx smok inhalation Engineer, drilling)  . Early death Brother   . Alzheimer's disease Paternal Grandmother   . Hypertrophic cardiomyopathy Son   . Early death Son   . Heart attack Paternal Grandfather 74  . Lymphoma Paternal Uncle        late 50's/early 23's  . Miscarriages / [place] Daughter   . Heart attack Brother   . Early death Son     Vitals:  BP 140/90 (BP Location: Left Arm, Patient Position: Sitting) Comment: taken manual  Pulse (!) 56   Temp 98.5 F (36.9 C) (Temporal)   Resp 18   Ht '5\' 6"'$  (1.676 m)   Wt 156 lb (70.8 kg)   SpO2 100%   BMI 25.18 kg/m    Physical Exam:  Physical Exam  Constitutional: She is oriented to person, place, and time. She appears well-developed and well-nourished. No distress.  Abdominal: Soft. She exhibits no distension. There is no abdominal tenderness. Hernia confirmed negative in the left inguinal area.  Genitourinary:    Vagina normal.      Lymphadenopathy:       Right:  Inguinal adenopathy present. No supraclavicular adenopathy present.       Left: No inguinal and no supraclavicular adenopathy present.  Neurological: She is alert and oriented to person, place, and time.     Assessment/Plan:  Ovarian cancer f/u Possible inguinal lymphadenopathy  >reviwew the CA-125, imaging findings >return prn  I personally spent 25 minutes face-to-face and non-face-to-face in the care of this patient, which includes all pre, intra, and post visit time on the date of service.  Lahoma Crocker, MD 11/14/2019, 1:41 PM

## 2019-11-14 NOTE — Assessment & Plan Note (Addendum)
Possible inguinal lymphadenopathy  >reviwew the CA-125, imaging findings >return prn

## 2019-11-15 ENCOUNTER — Encounter: Payer: Self-pay | Admitting: Obstetrics & Gynecology

## 2019-11-15 ENCOUNTER — Other Ambulatory Visit: Payer: Self-pay

## 2019-11-15 ENCOUNTER — Inpatient Hospital Stay: Payer: 59

## 2019-11-15 ENCOUNTER — Inpatient Hospital Stay: Payer: 59 | Attending: Obstetrics & Gynecology | Admitting: Obstetrics & Gynecology

## 2019-11-15 VITALS — BP 140/90 | HR 56 | Temp 98.5°F | Resp 18 | Ht 66.0 in | Wt 156.0 lb

## 2019-11-15 DIAGNOSIS — C569 Malignant neoplasm of unspecified ovary: Secondary | ICD-10-CM | POA: Diagnosis present

## 2019-11-15 DIAGNOSIS — Z90722 Acquired absence of ovaries, bilateral: Secondary | ICD-10-CM | POA: Diagnosis not present

## 2019-11-15 DIAGNOSIS — R599 Enlarged lymph nodes, unspecified: Secondary | ICD-10-CM

## 2019-11-15 DIAGNOSIS — R1909 Other intra-abdominal and pelvic swelling, mass and lump: Secondary | ICD-10-CM

## 2019-11-15 DIAGNOSIS — R590 Localized enlarged lymph nodes: Secondary | ICD-10-CM | POA: Diagnosis not present

## 2019-11-15 DIAGNOSIS — Z90710 Acquired absence of both cervix and uterus: Secondary | ICD-10-CM | POA: Diagnosis not present

## 2019-11-15 DIAGNOSIS — Z9221 Personal history of antineoplastic chemotherapy: Secondary | ICD-10-CM | POA: Diagnosis not present

## 2019-11-15 LAB — BASIC METABOLIC PANEL - CANCER CENTER ONLY
Anion gap: 9 (ref 5–15)
BUN: 16 mg/dL (ref 8–23)
CO2: 30 mmol/L (ref 22–32)
Calcium: 10 mg/dL (ref 8.9–10.3)
Chloride: 103 mmol/L (ref 98–111)
Creatinine: 0.91 mg/dL (ref 0.44–1.00)
GFR, Est AFR Am: >60 mL/min (ref >60)
GFR, Estimated: >60 mL/min (ref >60)
Glucose, Bld: 91 mg/dL (ref 70–99)
Potassium: 4 mmol/L (ref 3.5–5.1)
Sodium: 142 mmol/L (ref 135–145)

## 2019-11-15 NOTE — Patient Instructions (Signed)
Return as needed

## 2019-11-16 LAB — CA 125: Cancer Antigen (CA) 125: 15.1 U/mL (ref 0.0–38.1)

## 2019-11-22 ENCOUNTER — Ambulatory Visit (HOSPITAL_COMMUNITY): Payer: 59

## 2019-11-22 ENCOUNTER — Other Ambulatory Visit: Payer: Self-pay

## 2019-11-22 ENCOUNTER — Encounter (HOSPITAL_COMMUNITY): Payer: Self-pay

## 2019-11-22 ENCOUNTER — Ambulatory Visit (HOSPITAL_COMMUNITY)
Admission: RE | Admit: 2019-11-22 | Discharge: 2019-11-22 | Disposition: A | Discharge status: Home / Self Care | Payer: 59 | Source: Ambulatory Visit | Attending: Obstetrics & Gynecology | Admitting: Obstetrics & Gynecology

## 2019-11-22 DIAGNOSIS — C569 Malignant neoplasm of unspecified ovary: Secondary | ICD-10-CM | POA: Insufficient documentation

## 2019-11-22 DIAGNOSIS — R1909 Other intra-abdominal and pelvic swelling, mass and lump: Secondary | ICD-10-CM | POA: Diagnosis present

## 2019-11-22 DIAGNOSIS — R599 Enlarged lymph nodes, unspecified: Secondary | ICD-10-CM | POA: Insufficient documentation

## 2019-11-22 MED ORDER — SODIUM CHLORIDE (PF) 0.9 % IJ SOLN
INTRAMUSCULAR | Status: AC
  Filled 2019-11-22: qty 50

## 2019-11-22 MED ORDER — IOHEXOL 300 MG/ML  SOLN
100.0000 mL | Freq: Once | INTRAMUSCULAR | Status: AC | PRN
  Administered 2019-11-22: 100 mL via INTRAVENOUS

## 2019-11-24 ENCOUNTER — Other Ambulatory Visit: Payer: Self-pay | Admitting: Gynecologic Oncology

## 2019-11-24 ENCOUNTER — Telehealth: Payer: Self-pay

## 2019-11-24 ENCOUNTER — Encounter (HOSPITAL_COMMUNITY): Payer: Self-pay

## 2019-11-24 DIAGNOSIS — R1909 Other intra-abdominal and pelvic swelling, mass and lump: Secondary | ICD-10-CM

## 2019-11-24 NOTE — Progress Notes (Signed)
Per Dr. Delsa Sale, plan for biopsy of right inguinal soft tissue density.

## 2019-11-24 NOTE — Progress Notes (Unsigned)
LG 1  [name] Female, 64 y.o., 11-07-1955 MRN:  [id number] Phone:  [phone] (OTHER PHONE) PCP:  Ma Hillock, DO Coverage:  [employer]etna/[employer] Nap Next Appt With Family Medicine 02/14/2020 at 8:30 AM  RE: Biopsy Received: Today Message Contents  Leah Cleveland, MD  Leah Perkins   [place] core R medial inguinal nodule  May need CT if hard to see on [place]    DDH   Previous Messages  ----- Message -----  From: Leah Perkins  Sent: 11/24/2019  2:27 PM EDT  To: Ir Procedure Requests  Subject: Biopsy                      Procedure Requested: US Biopsy (Lymph Nodes)    Reason for Procedure:  per Dr. Delsa Sale, 1.5 cm soft tissue density in right inguinal area, hx ovarian ca    Provider Requesting: Joylene John D  Provider Telephone [phone]    Other Info: Rad Exam in Epic  p

## 2019-11-24 NOTE — Telephone Encounter (Signed)
Told Ms Tillett that the CT shows the area palpated and recommends further evaluation with Pet-Ct, however a bx of the area would need to be done to determine diagnosis. Dr. Delsa Sale would like to go ahead with a [place] bx at this time. Pt agreeable to this plan. Told her central scheduling will call her to schedule this after the radiologist reviews the scan. Pt verbalized understanding.

## 2019-11-29 ENCOUNTER — Other Ambulatory Visit (HOSPITAL_COMMUNITY): Payer: 59

## 2019-12-04 ENCOUNTER — Other Ambulatory Visit: Payer: Self-pay | Admitting: Student

## 2019-12-05 ENCOUNTER — Other Ambulatory Visit: Payer: Self-pay

## 2019-12-05 ENCOUNTER — Ambulatory Visit (HOSPITAL_COMMUNITY)
Admission: RE | Admit: 2019-12-05 | Discharge: 2019-12-05 | Disposition: A | Discharge status: Home / Self Care | Payer: 59 | Source: Ambulatory Visit | Attending: Gynecologic Oncology | Admitting: Gynecologic Oncology

## 2019-12-05 DIAGNOSIS — Z9221 Personal history of antineoplastic chemotherapy: Secondary | ICD-10-CM | POA: Diagnosis not present

## 2019-12-05 DIAGNOSIS — Z79899 Other long term (current) drug therapy: Secondary | ICD-10-CM | POA: Insufficient documentation

## 2019-12-05 DIAGNOSIS — Z8249 Family history of ischemic heart disease and other diseases of the circulatory system: Secondary | ICD-10-CM | POA: Diagnosis not present

## 2019-12-05 DIAGNOSIS — Z807 Family history of other malignant neoplasms of lymphoid, hematopoietic and related tissues: Secondary | ICD-10-CM | POA: Insufficient documentation

## 2019-12-05 DIAGNOSIS — R590 Localized enlarged lymph nodes: Secondary | ICD-10-CM | POA: Insufficient documentation

## 2019-12-05 DIAGNOSIS — Z8543 Personal history of malignant neoplasm of ovary: Secondary | ICD-10-CM | POA: Insufficient documentation

## 2019-12-05 DIAGNOSIS — I10 Essential (primary) hypertension: Secondary | ICD-10-CM | POA: Diagnosis not present

## 2019-12-05 DIAGNOSIS — Z808 Family history of malignant neoplasm of other organs or systems: Secondary | ICD-10-CM | POA: Diagnosis not present

## 2019-12-05 DIAGNOSIS — R1909 Other intra-abdominal and pelvic swelling, mass and lump: Secondary | ICD-10-CM

## 2019-12-05 MED ORDER — SODIUM CHLORIDE 0.9 % IV SOLN: INTRAVENOUS | Status: DC

## 2019-12-05 MED ORDER — FENTANYL CITRATE (PF) 100 MCG/2ML IJ SOLN
INTRAMUSCULAR | Status: AC
  Filled 2019-12-05: qty 4

## 2019-12-05 MED ORDER — LIDOCAINE-EPINEPHRINE 1 %-1:100000 IJ SOLN
INTRAMUSCULAR | Status: AC
  Filled 2019-12-05: qty 1

## 2019-12-05 MED ORDER — MIDAZOLAM HCL 2 MG/2ML IJ SOLN
INTRAMUSCULAR | Status: AC | PRN
  Administered 2019-12-05: 1 mg via INTRAVENOUS

## 2019-12-05 MED ORDER — MIDAZOLAM HCL 2 MG/2ML IJ SOLN
INTRAMUSCULAR | Status: AC
  Filled 2019-12-05: qty 4

## 2019-12-05 MED ORDER — FENTANYL CITRATE (PF) 100 MCG/2ML IJ SOLN
INTRAMUSCULAR | Status: AC | PRN
  Administered 2019-12-05: 50 ug via INTRAVENOUS

## 2019-12-05 NOTE — Procedures (Signed)
Pre Procedure Dx: Right inguinal nodule Post Procedural Dx: Same  Technically successful US guided biopsy of indeterminate nodule within the right groin.   EBL: None  No immediate complications.   Ronny Bacon, MD Pager #: [phone]

## 2019-12-05 NOTE — Discharge Instructions (Addendum)
Moderate Conscious Sedation, Adult Sedation is the use of medicines to promote relaxation and relieve discomfort and anxiety. Moderate conscious sedation is a type of sedation. Under moderate conscious sedation, you are less alert than normal, but you are still able to respond to instructions, touch, or both. Moderate conscious sedation is used during short medical and dental procedures. It is milder than deep sedation, which is a type of sedation under which you cannot be easily woken up. It is also milder than general anesthesia, which is the use of medicines to make you unconscious. Moderate conscious sedation allows you to return to your regular activities sooner. Tell a health care provider about:  Any allergies you have.  All medicines you are taking, including vitamins, herbs, eye drops, creams, and over-the-counter medicines.  Use of steroids (by mouth or creams).  Any problems you or family members have had with sedatives and anesthetic medicines.  Any blood disorders you have.  Any surgeries you have had.  Any medical conditions you have, such as sleep apnea.  Whether you are pregnant or may be pregnant.  Any use of cigarettes, alcohol, marijuana, or street drugs. What are the risks? Generally, this is a safe procedure. However, problems may occur, including:  Getting too much medicine (oversedation).  Nausea.  Allergic reaction to medicines.  Trouble breathing. If this happens, a breathing tube may be used to help with breathing. It will be removed when you are awake and breathing on your own.  Heart trouble.  Lung trouble. What happens before the procedure? Staying hydrated Follow instructions from your health care provider about hydration, which may include:  Up to 2 hours before the procedure - you may continue to drink clear liquids, such as water, clear fruit juice, black coffee, and plain tea. Eating and drinking restrictions Follow instructions from your  health care provider about eating and drinking, which may include:  8 hours before the procedure - stop eating heavy meals or foods such as meat, fried foods, or fatty foods.  6 hours before the procedure - stop eating light meals or foods, such as toast or cereal.  6 hours before the procedure - stop drinking milk or drinks that contain milk.  2 hours before the procedure - stop drinking clear liquids. Medicine Ask your health care provider about:  Changing or stopping your regular medicines. This is especially important if you are taking diabetes medicines or blood thinners.  Taking medicines such as aspirin and ibuprofen. These medicines can thin your blood. Do not take these medicines before your procedure if your health care provider instructs you not to.  Tests and exams  You will have a physical exam.  You may have blood tests done to show: ? How well your kidneys and liver are working. ? How well your blood can clot. General instructions  Plan to have someone take you home from the hospital or clinic.  If you will be going home right after the procedure, plan to have someone with you for 24 hours. What happens during the procedure?  An IV tube will be inserted into one of your veins.  Medicine to help you relax (sedative) will be given through the IV tube.  The medical or dental procedure will be performed. What happens after the procedure?  Your blood pressure, heart rate, breathing rate, and blood oxygen level will be monitored often until the medicines you were given have worn off.  Do not drive for 24 hours. This information is not   intended to replace advice given to you by your health care provider. Make sure you discuss any questions you have with your health care provider. Document Revised: 06/11/2017 Document Reviewed: 10/19/2015 Elsevier Patient Education  2020 Elsevier Inc. Needle Biopsy, Care After These instructions tell you how to care for yourself  after your procedure. Your doctor may also give you more specific instructions. Call your doctor if you have any problems or questions. What can I expect after the procedure? After the procedure, it is common to have:  Soreness.  Bruising.  Mild pain. Follow these instructions at home:   Return to your normal activities as told by your doctor. Ask your doctor what activities are safe for you.  Take over-the-counter and prescription medicines only as told by your doctor.  Wash your hands with soap and water before you change your bandage (dressing). If you cannot use soap and water, use hand sanitizer.  Follow instructions from your doctor about: ? How to take care of your puncture site. ? When and how to change your bandage. ? When to remove your bandage.  Check your puncture site every day for signs of infection. Watch for: ? Redness, swelling, or pain. ? Fluid or blood. ? Pus or a bad smell. ? Warmth.  Do not take baths, swim, or use a hot tub until your doctor approves. Ask your doctor if you may take showers. You may only be allowed to take sponge baths.  Keep all follow-up visits as told by your doctor. This is important. Contact a doctor if you have:  A fever.  Redness, swelling, or pain at the puncture site, and it lasts longer than a few days.  Fluid, blood, or pus coming from the puncture site.  Warmth coming from the puncture site. Get help right away if:  You have a lot of bleeding from the puncture site. Summary  After the procedure, it is common to have soreness, bruising, or mild pain at the puncture site.  Check your puncture site every day for signs of infection, such as redness, swelling, or pain.  Get help right away if you have severe bleeding from your puncture site. This information is not intended to replace advice given to you by your health care provider. Make sure you discuss any questions you have with your health care provider. Document  Revised: 07/12/2017 Document Reviewed: 07/12/2017 Elsevier Patient Education  2020 Elsevier Inc.  

## 2019-12-05 NOTE — H&P (Signed)
Chief Complaint: Patient was seen in consultation today for right inguinal lymphadenopathy/biopsy.  Referring Physician(s): Cross,Melissa D  Supervising Physician: Sandi Mariscal  Patient Status: [facility] - Out-pt  History of Present Illness: Leah Perkins is a 64 y.o. female with a past medical history of hypertension, ovarian cancer, and cystitis. She was unfortunately diagnosed with ovarian cancer in 09/2011. Her  Cancer is managed by Dr. Delsa Sale and Joylene John, NP. She has completed a course of systemic chemotherapy and is currently in remission. At her recent oncology follow-up, patient complaining of mass in right groin area. CT abdomen/pelvis was obtained for further evaluation.  CT abdomen/pelvis 11/22/2019: 1. 1.5 cm soft tissue density in right inguinal region, which is contiguous with the lower rectus abdominus muscle. This is nonspecific, and could represent a small lymph node, soft tissue neoplasm, or scarring, with metastatic disease considered unlikely. Recommend correlation with tumor markers, and consider further evaluation with PET-CT or follow-up by pelvic CT in 3 months. 2. No evidence of inguinal hernia. 3. Benign hepatic hemangioma and tiny cysts.  IR requested by Joylene John, NP for possible image-guided right inguinal lymph node biopsy. Patient awake and alert sitting in bed with no complaints at this time. Denies fever, chills, chest pain, dyspnea, abdominal pain, or headache.   Past Medical History:  Diagnosis Date  . Allergy   . Chicken pox   . Hypertension   . 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 UTI (urinary tract infection)     Past Surgical History:  Procedure Laterality Date  . ABDOMINAL HYSTERECTOMY  09/2011   TAHBSO with appendectomy and optimal tumor debulking  . APPENDECTOMY  2009   w/ hysterectomy  . DILATION AND CURETTAGE OF UTERUS  1996  . HAMMER TOE SURGERY Right 2017    . KNEE ARTHROSCOPY Right 2009   Knee  . TONSILLECTOMY  1991  . WISDOM TOOTH EXTRACTION  1985    Allergies: Lisinopril and Sulfa antibiotics  Medications: Prior to Admission medications   Medication Sig Start Date End Date Taking? Authorizing Provider  atenolol (TENORMIN) 25 MG tablet Take 1 tablet (25 mg total) by mouth daily. 08/15/19  Yes Kuneff, Renee A, DO  Calcium Carb-Cholecalciferol (CALCIUM 600 + D PO) Take 1 tablet by mouth 2 (two) times daily.   Yes [provider]  Multiple Vitamins-Minerals (MULTIVITAMIN WITH MINERALS) tablet Take 1 tablet by mouth daily.   Yes [provider]  vitamin C (ASCORBIC ACID) 500 MG tablet Take 500 mg by mouth 2 (two) times daily.   Yes [provider]  acetaminophen (TYLENOL) 500 MG tablet Take 1,000 mg by mouth every 6 (six) hours as needed for moderate pain or headache.    [provider]     Family History  Problem Relation Age of Onset  . Hypertension Mother   . Hypertrophic cardiomyopathy Mother 51       pt negativ rofr gene 02-Sep-2012  . Early death Mother   . Alzheimer's disease Father   . Cancer Brother        Squamous cell of sinus, hx smok inhalation Engineer, drilling)  . Early death Brother   . Alzheimer's disease Paternal Grandmother   . Hypertrophic cardiomyopathy Son   . Early death Son   . Heart attack Paternal Grandfather 74  . Lymphoma Paternal Uncle        late 50's/early 35's  . Miscarriages / [place] Daughter   .  Heart attack Brother   . Early death Son     Social History   Socioeconomic History  . Marital status: Married    Spouse name: Not on file  . Number of children: Not on file  . Years of education: Not on file  . Highest education level: Not on file  Occupational History  . Not on file  Tobacco Use  . Smoking status: Never Smoker  . Smokeless tobacco: Never Used  Substance and Sexual Activity  . Alcohol use: Yes    Comment: Socially ~1 / week  . Drug use: No  .  Sexual activity: Yes    Partners: Male  Other Topics Concern  . Not on file  Social History Narrative   Marital status/children/pets: Married, 3 children. Has grandchildren   Education/employment: [employer]S., retired   Engineer, materials:      -Wears a bicycle helmet riding a bike: Yes     -smoke alarm in the home:Yes     - wears seatbelt: Yes     - Feels safe in their relationships: Yes   Social Determinants of Health   Financial Resource Strain:   . Difficulty of Paying Living Expenses:   Food Insecurity:   . Worried About Charity fundraiser in the Last Year:   . Arboriculturist in the Last Year:   Transportation Needs:   . Film/video editor (Medical):   Marland Kitchen Lack of Transportation (Non-Medical):   Physical Activity:   . Days of Exercise per Week:   . Minutes of Exercise per Session:   Stress:   . Feeling of Stress :   Social Connections:   . Frequency of Communication with Friends and Family:   . Frequency of Social Gatherings with Friends and Family:   . Attends Religious Services:   . Active Member of Clubs or Organizations:   . Attends Archivist Meetings:   Marland Kitchen Marital Status:      Review of Systems: A 12 point ROS discussed and pertinent positives are indicated in the HPI above.  All other systems are negative.  Review of Systems  Constitutional: Negative for chills and fever.  Respiratory: Negative for shortness of breath and wheezing.   Cardiovascular: Negative for chest pain and palpitations.  Gastrointestinal: Negative for abdominal pain.  Neurological: Negative for headaches.  Psychiatric/Behavioral: Negative for behavioral problems and confusion.    Vital Signs: BP (!) 155/96   Pulse (!) 56   Temp 97.6 F (36.4 C) (Skin)   Resp 17   Ht 5\' 6"  (1.676 m)   Wt 155 lb (70.3 kg)   SpO2 100%   BMI 25.02 kg/m   Physical Exam Vitals and nursing note reviewed.  Constitutional:      General: She is not in acute distress.    Appearance: Normal appearance.   Cardiovascular:     Rate and Rhythm: Normal rate and regular rhythm.     Heart sounds: Normal heart sounds. No murmur.  Pulmonary:     Effort: Pulmonary effort is normal. No respiratory distress.     Breath sounds: Normal breath sounds. No wheezing.  Skin:    General: Skin is warm and dry.  Neurological:     Mental Status: She is alert and oriented to person, place, and time.      MD Evaluation Airway: WNL Heart: WNL Abdomen: WNL Chest/ Lungs: WNL ASA  Classification: 3 Mallampati/Airway Score: Two   Imaging: CT Abdomen Pelvis W Contrast  Result Date: 11/22/2019  CLINICAL DATA:  Palpable mass in right inguinal region. Personal history of ovarian carcinoma. EXAM: CT ABDOMEN AND PELVIS WITH CONTRAST TECHNIQUE: Multidetector CT imaging of the abdomen and pelvis was performed using the standard protocol following bolus administration of intravenous contrast. CONTRAST:  149mL OMNIPAQUE IOHEXOL 300 MG/ML  SOLN COMPARISON:  None. FINDINGS: Lower Chest: No acute findings. Hepatobiliary: A 2.0 cm benign hemangioma is seen central right hepatic lobe. Other tiny sub-cm cysts are noted in both the right and left lobes. Gallbladder is unremarkable. No evidence of biliary ductal dilatation. Pancreas:  No mass or inflammatory changes. Spleen: Within normal limits in size and appearance. Adrenals/Urinary Tract: No masses identified. No evidence of ureteral calculi or hydronephrosis. Stomach/Bowel: No evidence of obstruction, inflammatory process or abnormal fluid collections. Vascular/Lymphatic: No definite pathologically enlarged lymph nodes. No abdominal aortic aneurysm. Reproductive: Prior hysterectomy and retroperitoneal lymph node dissection. No pelvic mass identified. No evidence of ascites. Other: No evidence of inguinal hernia. A soft tissue density is seen in the right inguinal region which is contiguous with the lower rectus abdominus muscle, measuring 1.5 x 1.1 cm on image 69/2. This is  nonspecific. Musculoskeletal:  No suspicious bone lesions identified. IMPRESSION: 1. 1.5 cm soft tissue density in right inguinal region, which is contiguous with the lower rectus abdominus muscle. This is nonspecific, and could represent a small lymph node, soft tissue neoplasm, or scarring, with metastatic disease considered unlikely. Recommend correlation with tumor markers, and consider further evaluation with PET-CT or follow-up by pelvic CT in 3 months. 2. No evidence of inguinal hernia. 3. Benign hepatic hemangioma and tiny cysts. Electronically Signed   By: Marlaine Hind M.D.   On: 11/22/2019 11:53    Labs:  CBC: Recent Labs    02/13/19 1135  WBC 3.9*  HGB 12.4  HCT 37.0  PLT 251.0    COAGS: No results for input(s): INR, APTT in the last 8760 hours.  BMP: Recent Labs    02/13/19 1135 11/15/19 1032  NA 140 142  K 4.6 4.0  CL 103 103  CO2 30 30  GLUCOSE 91 91  BUN 25* 16  CALCIUM 10.2 10.0  CREATININE 0.98 0.91  GFRNONAA  --  >60  GFRAA  --  >60    LIVER FUNCTION TESTS: Recent Labs    02/13/19 1135  BILITOT 0.8  AST 22  ALT 19  ALKPHOS 54  PROT 6.7  ALBUMIN 4.2     Assessment and Plan:  Right inguinal lymphadenopathy in setting of personal history of ovarian carcinoma. Plan for image-guided right inguinal lymph node biopsy today in IR. Patient is NPO. Afebrile.  Risks and benefits discussed with the patient including, but not limited to bleeding, infection, damage to adjacent structures or low yield requiring additional tests. All of the patient's questions were answered, patient is agreeable to proceed. Consent signed and in chart.   Thank you for this interesting consult.  I greatly enjoyed meeting TALEAH SUMERLIN and look forward to participating in their care.  A copy of this report was sent to the requesting provider on this date.  Electronically Signed: Earley Abide, PA-C 12/05/2019, 12:50 PM   I spent a total of 30 Minutes in face to face  in clinical consultation, greater than 50% of which was counseling/coordinating care for right inguinal lymphadenopathy/biopsy.

## 2019-12-07 LAB — SURGICAL PATHOLOGY

## 2019-12-08 ENCOUNTER — Telehealth: Payer: Self-pay

## 2019-12-08 NOTE — Telephone Encounter (Signed)
Patient called to inquire about biopsy results from 12/05/19.  MD not in clinic today.  Dr. Berline Lopes looked at path and reported no cancer.  Patient is still concerned about the lump that is still present and would like to know what to do moving forward.  Phone appointment made with Delsa Sale, MD to discuss above.  Patient voiced understanding.  No further questions.

## 2019-12-12 ENCOUNTER — Telehealth: Payer: Self-pay | Admitting: Obstetrics & Gynecology

## 2019-12-12 NOTE — Telephone Encounter (Signed)
Contact patient to verify telephone visit for pre reg °

## 2019-12-12 NOTE — Progress Notes (Addendum)
Telephone Encounter  Encounter Description/Consent: This encounter was conducted from the [place] center via Telephone with the patient Leah Perkins reports they are located at homeand was recommend to find a private place for this visit for their privacy and HIPAA safety"during the visit encounter. Discussed the choice to participate in care through the use of Telehealth service. Telehealth enables health care providers at different locations to provide safe, effective, and convenient care through the use of technology during COVID-19 pandemic. As with any health care service, there are risks associated with the use of Telehealth, including lack of visualization and that there may be instances were they need to come to clinic to complete the assessment. Patient verbally understands the risks and benefits of Telehealth as explained. All questions regarding Telehealth answered.  Assessment/Plan:   Remote h/o a stage IIC ovarian malignancy now s/p biopsy of a right-sided inguinal mass that showed benign histology w/negative tumor markers.  The mass is symptomatic.  > Referral to general surgery for possible extirpating of procedure  Follow up: annual f/u w/a generalist is appropriate Patient will call the clinic or use MyChart should anything change or any new issues arise.  Subjective   Chief Complaint: Leah Perkins presents for this follow-up Telephone visit  History of Present Illness: At the last visit she presented w/a right-sided inguinal mass.  She is s/p US guided right inguinal nodule biopsy on 5/25.  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  She c/o discomfort exacerbated by ambulation/physical activity relate to the mass.     Objective:  ( Laboratory and Test Data (as available)) CA-125: 15.1 CT scan abdomen/pelvis:  1.5 cm soft tissue density in right inguinal region, which is contiguous with the lower rectus abdominus muscle. This is nonspecific, and could  represent a small lymph node, soft tissue neoplasm, or scarring, with metastatic disease considered unlikely Pathology: LYMPH NODE, RIGHT GROIN, NEEDLE CORE BIOPSY:  - Benign connective tissue.  - No lymph node or malignancy.   Medical Decision Making:   25 minutes was spent on this medically necessary service. Pt visit by Telephone in the setting of State of Emergency due to COVID-19 Pandemic.

## 2019-12-13 ENCOUNTER — Encounter: Payer: Self-pay | Admitting: Obstetrics & Gynecology

## 2019-12-13 ENCOUNTER — Inpatient Hospital Stay: Payer: 59 | Attending: Obstetrics & Gynecology | Admitting: Obstetrics & Gynecology

## 2019-12-13 DIAGNOSIS — Z8543 Personal history of malignant neoplasm of ovary: Secondary | ICD-10-CM

## 2019-12-13 DIAGNOSIS — R1909 Other intra-abdominal and pelvic swelling, mass and lump: Secondary | ICD-10-CM | POA: Diagnosis not present

## 2020-01-02 ENCOUNTER — Ambulatory Visit: Payer: Self-pay | Admitting: Surgery

## 2020-01-02 NOTE — H&P (Signed)
Leah Perkins Appointment: 01/02/2020 9:40 AM Location: [facility] Patient #: [id number] DOB: 1955-11-18 Married / Language: Leah Perkins / Race: White Female  History of Present Illness Leah Moores A. Kynzee Devinney MD; 01/02/2020 12:16 PM) Patient words: Patient presents for evaluation of a right groin mass. She is a history of ovarian cancer and is followed by gynecological oncology services. She was sent at the request of Dr. Delsa Sale evaluation of a right groin mass. This presented about 6 weeks ago. She underwent a computed tomography scan which showed a right groin mass and core biopsy was performed which was benign. The mass is more noticeable when she stands up. She has no nausea or vomiting. The mass is more painful especially when she is physically active or standing.  The patient is a 64 year old female.   Past Surgical History Sharyn Lull R. Brooks, CMA; 01/02/2020 10:18 AM) Foot Surgery Right. Hysterectomy (due to cancer) - Complete  Diagnostic Studies History Sharyn Lull R. Brooks, CMA; 01/02/2020 10:18 AM) Colonoscopy 1-5 years ago Mammogram within last year  Allergies Darden Palmer, RMA; 01/02/2020 9:58 AM) Sulfacetamide *CHEMICALS*  Medication History Darden Palmer, [place]; 01/02/2020 10:02 AM) Atenolol (25MG  Tablet, Oral) Active. Acetaminophen (100MG /ML Solution, Oral) Active. Calcium Carb-Cholecalciferol (600-400MG -UNIT Tablet, Oral) Active. Multiple Vitamin (Oral) Active. Vitamin C (100MG  Tablet, Oral) Active. Calcium + D (150-261-330MG -MG-IU Capsule, Oral) Active.  Social History Sharyn Lull R. Brooks, CMA; 01/02/2020 10:18 AM) Caffeine use Coffee. Tobacco use Never smoker.  Family History Sharyn Lull R. Rolena Infante, CMA; 01/02/2020 10:18 AM) Cerebrovascular Accident Family Members In General. Hypertension Family Members In General.  Pregnancy / Birth History Sharyn Lull R. Rolena Infante, CMA; 01/02/2020 10:18 AM) Maternal age 67-30     Review of Systems  Sharyn Lull R. Brooks CMA; 01/02/2020 10:18 AM) General Not Present- Appetite Loss, Chills, Fatigue, Fever, Night Sweats, Weight Gain and Weight Loss. HEENT Present- Seasonal Allergies. Not Present- Earache, Hearing Loss, Hoarseness, Nose Bleed, Oral Ulcers, Ringing in the Ears, Sinus Pain, Sore Throat, Visual Disturbances, Wears glasses/contact lenses and Yellow Eyes. Female Genitourinary Not Present- Frequency, Nocturia, Painful Urination, Pelvic Pain and Urgency. Musculoskeletal Not Present- Back Pain, Joint Pain, Joint Stiffness, Muscle Pain, Muscle Weakness and Swelling of Extremities. Neurological Not Present- Decreased Memory, Fainting, Headaches, Numbness, Seizures, Tingling, Tremor, Trouble walking and Weakness. Psychiatric Not Present- Anxiety, Bipolar, Change in Sleep Pattern, Depression, Fearful and Frequent crying.  Vitals Lattie Haw [place] RMA; 01/02/2020 10:02 AM) 01/02/2020 10:02 AM Weight: 153.13 lb Height: 66in Body Surface Area: 1.79 m Body Mass Index: 24.71 kg/m  Temp.: 97.75F  Pulse: 65 (Regular)  BP: 138/78(Sitting, Left Arm, Standard)        Physical Exam (Leah Boutin A. Sadako Cegielski MD; 01/02/2020 12:17 PM)  General Mental Status-Alert. General Appearance-Consistent with stated age. Hydration-Well hydrated. Voice-Normal.  Head and Neck Head-normocephalic, atraumatic with no lesions or palpable masses. Trachea-midline. Thyroid Gland Characteristics - normal size and consistency.  Chest and Lung Exam Chest and lung exam reveals -quiet, even and easy respiratory effort with no use of accessory muscles and on auscultation, normal breath sounds, no adventitious sounds and normal vocal resonance. Inspection Chest Wall - Normal. Back - normal.  Cardiovascular Cardiovascular examination reveals -normal heart sounds, regular rate and rhythm with no murmurs and normal pedal pulses bilaterally.  Abdomen Note: Reducible right inguinal hernia noted.  No evidence of left inguinal hernia.  Neurologic Neurologic evaluation reveals -alert and oriented x 3 with no impairment of recent or remote memory. Mental Status-Normal.  Neuropsychiatric Examination of related systems reveals-The patient is well-nourished and  well-groomed. Mental status exam performed with findings of-Oriented X3 with appropriate mood and affect.  Lymphatic Head & Neck  General Head & Neck Lymphatics: Bilateral - Description - Normal. Axillary  General Axillary Region: Bilateral - Description - Normal. Tenderness - Non Tender. Femoral & Inguinal  Generalized Femoral & Inguinal Lymphatics: Bilateral - Description - Normal. Tenderness - Non Tender.    Assessment & Plan (Leah Gabrielsen A. Sonu Kruckenberg MD; 01/02/2020 12:17 PM)  RIGHT INGUINAL HERNIA (K40.90) Impression: Recommend repair of symptomatic right inguinal hernia with mesh. The risk of hernia repair include bleeding, infection, organ injury, bowel injury, bladder injury, nerve injury recurrent hernia, blood clots, worsening of underlying condition, chronic pain, mesh use, open surgery, death, and the need for other operations. Pt agrees to proceed  Current Plans You are being scheduled for surgery- Our schedulers will call you.  You should hear from our office's scheduling department within 5 working days about the location, date, and time of surgery. We try to make accommodations for patient's preferences in scheduling surgery, but sometimes the OR schedule or the surgeon's schedule prevents [place] from making those accommodations.  If you have not heard from our office [phone]) in 5 working days, call the office and ask for your surgeon's nurse.  If you have other questions about your diagnosis, plan, or surgery, call the office and ask for your surgeon's nurse.  Pt Education - Pamphlet Given - Hernia Surgery: discussed with patient and provided information. The anatomy & physiology of the abdominal  wall and pelvic floor was discussed. The pathophysiology of hernias in the inguinal and pelvic region was discussed. Natural history risks such as progressive enlargement, pain, incarceration, and strangulation was discussed. Contributors to complications such as smoking, obesity, diabetes, prior surgery, etc were discussed.  I feel the risks of no intervention will lead to serious problems that outweigh the operative risks; therefore, I recommended surgery to reduce and repair the hernia. I explained laparoscopic techniques with possible need for an open approach. I noted usual use of mesh to patch and/or buttress hernia repair  Risks such as bleeding, infection, abscess, need for further treatment, heart attack, death, and other risks were discussed. I noted a good likelihood this will help address the problem. Goals of post-operative recovery were discussed as well. Possibility that this will not correct all symptoms was explained. I stressed the importance of low-impact activity, aggressive pain control, avoiding constipation, & not pushing through pain to minimize risk of post-operative chronic pain or injury. Possibility of reherniation was discussed. We will work to minimize complications.  An educational handout further explaining the pathology & treatment options was given as well. Questions were answered. The patient expresses understanding & wishes to proceed with surgery.

## 2020-01-24 ENCOUNTER — Other Ambulatory Visit: Payer: Self-pay | Admitting: Family Medicine

## 2020-02-05 ENCOUNTER — Other Ambulatory Visit (HOSPITAL_COMMUNITY)
Admission: RE | Admit: 2020-02-05 | Discharge: 2020-02-05 | Disposition: A | Discharge status: Home / Self Care | Payer: 59 | Source: Ambulatory Visit | Attending: Surgery | Admitting: Surgery

## 2020-02-05 DIAGNOSIS — Z20822 Contact with and (suspected) exposure to covid-19: Secondary | ICD-10-CM | POA: Diagnosis not present

## 2020-02-05 DIAGNOSIS — Z01812 Encounter for preprocedural laboratory examination: Secondary | ICD-10-CM | POA: Insufficient documentation

## 2020-02-05 LAB — SARS CORONAVIRUS 2 (TAT 6-24 HRS): SARS Coronavirus 2: NEGATIVE

## 2020-02-07 ENCOUNTER — Encounter (HOSPITAL_COMMUNITY): Payer: Self-pay | Admitting: Surgery

## 2020-02-07 NOTE — Progress Notes (Signed)
Patient denies shortness of breath, fever, cough or chest pain.  PCP - Dr Howard Pouch Cardiologist - n/a Oncologist -  Dr Gehrig/Dr Delsa Sale (GYN Oncology)  Chest x-ray - n/a EKG - DOS 02/08/20 Stress Test - n/a ECHO - n/a Cardiac Cath - n/a  ERAS:  Clears til 11 am DOS, no drink.  STOP now taking any Aspirin (unless otherwise instructed by your surgeon), Aleve, Naproxen, Ibuprofen, Motrin, Advil, Goody's, BC's, all herbal medications, fish oil, and all vitamins.   Coronavirus Screening Covid test on 02/05/20 was negative.  Patient verbalized understanding of instructions that were given via phone.

## 2020-02-08 ENCOUNTER — Ambulatory Visit (HOSPITAL_COMMUNITY)
Admission: RE | Admit: 2020-02-08 | Discharge: 2020-02-08 | Disposition: A | Discharge status: Home / Self Care | Payer: 59 | Source: Ambulatory Visit | Attending: Surgery | Admitting: Surgery

## 2020-02-08 ENCOUNTER — Encounter (HOSPITAL_COMMUNITY)
Admission: RE | Disposition: A | Discharge status: Home / Self Care | Payer: Self-pay | Source: Ambulatory Visit | Attending: Surgery

## 2020-02-08 ENCOUNTER — Ambulatory Visit (HOSPITAL_COMMUNITY): Payer: 59 | Admitting: Anesthesiology

## 2020-02-08 ENCOUNTER — Encounter (HOSPITAL_COMMUNITY): Payer: Self-pay | Admitting: Surgery

## 2020-02-08 ENCOUNTER — Other Ambulatory Visit: Payer: Self-pay

## 2020-02-08 DIAGNOSIS — K4090 Unilateral inguinal hernia, without obstruction or gangrene, not specified as recurrent: Secondary | ICD-10-CM | POA: Diagnosis not present

## 2020-02-08 DIAGNOSIS — I10 Essential (primary) hypertension: Secondary | ICD-10-CM | POA: Diagnosis not present

## 2020-02-08 DIAGNOSIS — Z8249 Family history of ischemic heart disease and other diseases of the circulatory system: Secondary | ICD-10-CM | POA: Diagnosis not present

## 2020-02-08 DIAGNOSIS — Z8543 Personal history of malignant neoplasm of ovary: Secondary | ICD-10-CM | POA: Diagnosis not present

## 2020-02-08 DIAGNOSIS — Z79899 Other long term (current) drug therapy: Secondary | ICD-10-CM | POA: Diagnosis not present

## 2020-02-08 HISTORY — PX: INGUINAL HERNIA REPAIR: SHX194

## 2020-02-08 LAB — CBC
HCT: 39.3 % (ref 36.0–46.0)
Hemoglobin: 12.5 g/dL (ref 12.0–15.0)
MCH: 28.2 pg (ref 26.0–34.0)
MCHC: 31.8 g/dL (ref 30.0–36.0)
MCV: 88.7 fL (ref 80.0–100.0)
Platelets: 266 10*3/uL (ref 150–400)
RBC: 4.43 MIL/uL (ref 3.87–5.11)
RDW: 14.5 % (ref 11.5–15.5)
WBC: 4.4 10*3/uL (ref 4.0–10.5)
nRBC: 0 % (ref 0.0–0.2)

## 2020-02-08 LAB — BASIC METABOLIC PANEL
Anion gap: 12 (ref 5–15)
BUN: 20 mg/dL (ref 8–23)
CO2: 24 mmol/L (ref 22–32)
Calcium: 9.4 mg/dL (ref 8.9–10.3)
Chloride: 103 mmol/L (ref 98–111)
Creatinine, Ser: 0.99 mg/dL (ref 0.44–1.00)
GFR calc Af Amer: >60 mL/min (ref >60)
GFR calc non Af Amer: >60 mL/min (ref >60)
Glucose, Bld: 85 mg/dL (ref 70–99)
Potassium: 4.6 mmol/L (ref 3.5–5.1)
Sodium: 139 mmol/L (ref 135–145)

## 2020-02-08 SURGERY — REPAIR, HERNIA, INGUINAL, ADULT
Anesthesia: General | Site: Inguinal | Laterality: Right

## 2020-02-08 MED ORDER — CHLORHEXIDINE GLUCONATE 0.12 % MT SOLN
OROMUCOSAL | Status: AC
  Administered 2020-02-08: 15 mL via OROMUCOSAL
  Filled 2020-02-08: qty 15

## 2020-02-08 MED ORDER — GABAPENTIN 300 MG PO CAPS: 300.0000 mg | ORAL_CAPSULE | ORAL | Status: AC

## 2020-02-08 MED ORDER — CHLORHEXIDINE GLUCONATE CLOTH 2 % EX PADS: 6.0000 | MEDICATED_PAD | Freq: Once | CUTANEOUS | Status: DC

## 2020-02-08 MED ORDER — ACETAMINOPHEN 500 MG PO TABS: 1000.0000 mg | ORAL_TABLET | ORAL | Status: AC

## 2020-02-08 MED ORDER — 0.9 % SODIUM CHLORIDE (POUR BTL) OPTIME
TOPICAL | Status: DC | PRN
  Administered 2020-02-08: 1000 mL

## 2020-02-08 MED ORDER — EPHEDRINE 5 MG/ML INJ
INTRAVENOUS | Status: AC
  Filled 2020-02-08: qty 10

## 2020-02-08 MED ORDER — HYDRALAZINE HCL 20 MG/ML IJ SOLN
INTRAMUSCULAR | Status: AC
  Filled 2020-02-08: qty 1

## 2020-02-08 MED ORDER — FENTANYL CITRATE (PF) 100 MCG/2ML IJ SOLN: 50.0000 ug | Freq: Once | INTRAMUSCULAR | Status: AC

## 2020-02-08 MED ORDER — ACETAMINOPHEN 500 MG PO TABS
ORAL_TABLET | ORAL | Status: AC
  Administered 2020-02-08: 1000 mg via ORAL
  Filled 2020-02-08: qty 2

## 2020-02-08 MED ORDER — LACTATED RINGERS IV SOLN: INTRAVENOUS | Status: DC

## 2020-02-08 MED ORDER — MIDAZOLAM HCL 2 MG/2ML IJ SOLN
INTRAMUSCULAR | Status: AC
  Filled 2020-02-08: qty 2

## 2020-02-08 MED ORDER — CEFAZOLIN SODIUM-DEXTROSE 2-4 GM/100ML-% IV SOLN
INTRAVENOUS | Status: AC
  Filled 2020-02-08: qty 100

## 2020-02-08 MED ORDER — PROPOFOL 10 MG/ML IV BOLUS
INTRAVENOUS | Status: DC | PRN
  Administered 2020-02-08: 120 mg via INTRAVENOUS

## 2020-02-08 MED ORDER — MIDAZOLAM HCL 2 MG/2ML IJ SOLN
INTRAMUSCULAR | Status: AC
  Administered 2020-02-08: 1 mg via INTRAVENOUS
  Filled 2020-02-08: qty 2

## 2020-02-08 MED ORDER — ONDANSETRON HCL 4 MG/2ML IJ SOLN
INTRAMUSCULAR | Status: DC | PRN
  Administered 2020-02-08: 4 mg via INTRAVENOUS

## 2020-02-08 MED ORDER — FENTANYL CITRATE (PF) 250 MCG/5ML IJ SOLN
INTRAMUSCULAR | Status: AC
  Filled 2020-02-08: qty 5

## 2020-02-08 MED ORDER — SUGAMMADEX SODIUM 200 MG/2ML IV SOLN
INTRAVENOUS | Status: DC | PRN
  Administered 2020-02-08: 140 mg via INTRAVENOUS

## 2020-02-08 MED ORDER — OXYCODONE HCL 5 MG PO TABS: 5.0000 mg | ORAL_TABLET | Freq: Once | ORAL | Status: DC | PRN

## 2020-02-08 MED ORDER — IBUPROFEN 800 MG PO TABS
800.0000 mg | ORAL_TABLET | Freq: Three times a day (TID) | ORAL | 0 refills | Status: DC | PRN
Start: 2020-02-08 — End: 2020-02-14

## 2020-02-08 MED ORDER — PROPOFOL 10 MG/ML IV BOLUS
INTRAVENOUS | Status: AC
  Filled 2020-02-08: qty 20

## 2020-02-08 MED ORDER — HYDRALAZINE HCL 20 MG/ML IJ SOLN
5.0000 mg | INTRAMUSCULAR | Status: DC | PRN
  Administered 2020-02-08 (×2): 5 mg via INTRAVENOUS

## 2020-02-08 MED ORDER — MIDAZOLAM HCL 5 MG/5ML IJ SOLN
INTRAMUSCULAR | Status: DC | PRN
  Administered 2020-02-08: 2 mg via INTRAVENOUS

## 2020-02-08 MED ORDER — DEXAMETHASONE SODIUM PHOSPHATE 4 MG/ML IJ SOLN
INTRAMUSCULAR | Status: DC | PRN
  Administered 2020-02-08: 4 mg via INTRAVENOUS

## 2020-02-08 MED ORDER — OXYCODONE HCL 5 MG PO TABS
5.0000 mg | ORAL_TABLET | Freq: Four times a day (QID) | ORAL | 0 refills | Status: DC | PRN
Start: 2020-02-08 — End: 2020-02-14

## 2020-02-08 MED ORDER — FENTANYL CITRATE (PF) 100 MCG/2ML IJ SOLN
INTRAMUSCULAR | Status: DC | PRN
  Administered 2020-02-08: 100 ug via INTRAVENOUS
  Administered 2020-02-08: 50 ug via INTRAVENOUS

## 2020-02-08 MED ORDER — ORAL CARE MOUTH RINSE: 15.0000 mL | Freq: Once | OROMUCOSAL | Status: AC

## 2020-02-08 MED ORDER — OXYCODONE HCL 5 MG/5ML PO SOLN: 5.0000 mg | Freq: Once | ORAL | Status: DC | PRN

## 2020-02-08 MED ORDER — FENTANYL CITRATE (PF) 100 MCG/2ML IJ SOLN: 25.0000 ug | INTRAMUSCULAR | Status: DC | PRN

## 2020-02-08 MED ORDER — ONDANSETRON HCL 4 MG/2ML IJ SOLN
INTRAMUSCULAR | Status: AC
  Filled 2020-02-08: qty 2

## 2020-02-08 MED ORDER — LIDOCAINE HCL (CARDIAC) PF 100 MG/5ML IV SOSY
PREFILLED_SYRINGE | INTRAVENOUS | Status: DC | PRN
  Administered 2020-02-08: 50 mg via INTRAVENOUS

## 2020-02-08 MED ORDER — CEFAZOLIN SODIUM-DEXTROSE 2-3 GM-%(50ML) IV SOLR
INTRAVENOUS | Status: DC | PRN
  Administered 2020-02-08: 2 g via INTRAVENOUS

## 2020-02-08 MED ORDER — CEFAZOLIN SODIUM-DEXTROSE 2-4 GM/100ML-% IV SOLN: 2.0000 g | INTRAVENOUS | Status: DC

## 2020-02-08 MED ORDER — GABAPENTIN 300 MG PO CAPS
ORAL_CAPSULE | ORAL | Status: AC
  Administered 2020-02-08: 300 mg via ORAL
  Filled 2020-02-08: qty 1

## 2020-02-08 MED ORDER — LIDOCAINE 2% (20 MG/ML) 5 ML SYRINGE
INTRAMUSCULAR | Status: AC
  Filled 2020-02-08: qty 5

## 2020-02-08 MED ORDER — BUPIVACAINE HCL (PF) 0.25 % IJ SOLN
INTRAMUSCULAR | Status: DC | PRN
  Administered 2020-02-08: 5 mL

## 2020-02-08 MED ORDER — FENTANYL CITRATE (PF) 100 MCG/2ML IJ SOLN
INTRAMUSCULAR | Status: AC
  Administered 2020-02-08: 50 ug via INTRAVENOUS
  Filled 2020-02-08: qty 2

## 2020-02-08 MED ORDER — BUPIVACAINE-EPINEPHRINE (PF) 0.5% -1:200000 IJ SOLN
INTRAMUSCULAR | Status: DC | PRN
Start: 2020-02-08 — End: 2020-02-08
  Administered 2020-02-08: 25 mL via PERINEURAL

## 2020-02-08 MED ORDER — MIDAZOLAM HCL 2 MG/2ML IJ SOLN: 1.0000 mg | Freq: Once | INTRAMUSCULAR | Status: AC

## 2020-02-08 MED ORDER — CHLORHEXIDINE GLUCONATE 0.12 % MT SOLN: 15.0000 mL | Freq: Once | OROMUCOSAL | Status: AC

## 2020-02-08 MED ORDER — ONDANSETRON HCL 4 MG/2ML IJ SOLN: 4.0000 mg | Freq: Four times a day (QID) | INTRAMUSCULAR | Status: DC | PRN

## 2020-02-08 MED ORDER — BUPIVACAINE HCL (PF) 0.25 % IJ SOLN
INTRAMUSCULAR | Status: AC
  Filled 2020-02-08: qty 30

## 2020-02-08 MED ORDER — ROCURONIUM BROMIDE 100 MG/10ML IV SOLN
INTRAVENOUS | Status: DC | PRN
  Administered 2020-02-08: 50 mg via INTRAVENOUS

## 2020-02-08 MED ORDER — DEXAMETHASONE SODIUM PHOSPHATE 10 MG/ML IJ SOLN
INTRAMUSCULAR | Status: AC
  Filled 2020-02-08: qty 1

## 2020-02-08 MED ORDER — EPHEDRINE SULFATE-NACL 50-0.9 MG/10ML-% IV SOSY
PREFILLED_SYRINGE | INTRAVENOUS | Status: DC | PRN
  Administered 2020-02-08 (×3): 5 mg via INTRAVENOUS

## 2020-02-08 SURGICAL SUPPLY — 38 items
BLADE CLIPPER SURG (BLADE) ×3 IMPLANT
CANISTER SUCT 3000ML PPV (MISCELLANEOUS) IMPLANT
CHLORAPREP W/TINT 26 (MISCELLANEOUS) ×3 IMPLANT
COVER SURGICAL LIGHT HANDLE (MISCELLANEOUS) ×3 IMPLANT
COVER WAND RF STERILE (DRAPES) IMPLANT
DERMABOND ADVANCED (GAUZE/BANDAGES/DRESSINGS) ×2
DERMABOND ADVANCED .7 DNX12 (GAUZE/BANDAGES/DRESSINGS) ×1 IMPLANT
DRAIN PENROSE 1/2X12 LTX STRL (WOUND CARE) IMPLANT
DRAPE LAPAROTOMY TRNSV 102X78 (DRAPES) ×3 IMPLANT
ELECT REM PT RETURN 9FT ADLT (ELECTROSURGICAL) ×3
ELECTRODE REM PT RTRN 9FT ADLT (ELECTROSURGICAL) ×1 IMPLANT
GLOVE BIO SURGEON STRL SZ8 (GLOVE) ×3 IMPLANT
GLOVE BIOGEL PI IND STRL 8 (GLOVE) ×1 IMPLANT
GLOVE BIOGEL PI INDICATOR 8 (GLOVE) ×2
GOWN STRL REUS W/ TWL LRG LVL3 (GOWN DISPOSABLE) ×1 IMPLANT
GOWN STRL REUS W/ TWL XL LVL3 (GOWN DISPOSABLE) ×1 IMPLANT
GOWN STRL REUS W/TWL LRG LVL3 (GOWN DISPOSABLE) ×2
GOWN STRL REUS W/TWL XL LVL3 (GOWN DISPOSABLE) ×2
KIT BASIN OR (CUSTOM PROCEDURE TRAY) ×3 IMPLANT
KIT TURNOVER KIT B (KITS) ×3 IMPLANT
MESH HERNIA SYS ULTRAPRO LRG (Mesh General) ×3 IMPLANT
NEEDLE HYPO 25GX1X1/2 BEV (NEEDLE) ×3 IMPLANT
NS IRRIG 1000ML POUR BTL (IV SOLUTION) ×3 IMPLANT
PACK GENERAL/GYN (CUSTOM PROCEDURE TRAY) ×3 IMPLANT
PAD ARMBOARD 7.5X6 YLW CONV (MISCELLANEOUS) ×3 IMPLANT
PENCIL SMOKE EVACUATOR (MISCELLANEOUS) ×3 IMPLANT
SUT MNCRL AB 4-0 PS2 18 (SUTURE) ×3 IMPLANT
SUT NOVA NAB DX-16 0-1 5-0 T12 (SUTURE) ×3 IMPLANT
SUT SILK 2 0 SH (SUTURE) IMPLANT
SUT VIC AB 0 CT1 27 (SUTURE)
SUT VIC AB 0 CT1 27XBRD ANBCTR (SUTURE) IMPLANT
SUT VIC AB 2-0 SH 27 (SUTURE) ×2
SUT VIC AB 2-0 SH 27X BRD (SUTURE) ×1 IMPLANT
SUT VIC AB 3-0 SH 18 (SUTURE) ×3 IMPLANT
SUT VICRYL AB 3 0 TIES (SUTURE) ×3 IMPLANT
SYR CONTROL 10ML LL (SYRINGE) ×3 IMPLANT
TOWEL GREEN STERILE (TOWEL DISPOSABLE) IMPLANT
TOWEL GREEN STERILE FF (TOWEL DISPOSABLE) ×3 IMPLANT

## 2020-02-08 NOTE — Anesthesia Postprocedure Evaluation (Signed)
Anesthesia Post Note  Patient: Leah Perkins  Procedure(s) Performed: REPAIR RIGHT INGUINAL HERNIA WITH MESH (Right Inguinal)     Patient location during evaluation: PACU Anesthesia Type: General Level of consciousness: awake and alert Pain management: pain level controlled Vital Signs Assessment: post-procedure vital signs reviewed and stable Respiratory status: spontaneous breathing, nonlabored ventilation, respiratory function stable and patient connected to nasal cannula oxygen Cardiovascular status: blood pressure returned to baseline and stable Postop Assessment: no apparent nausea or vomiting Anesthetic complications: no   No complications documented.  Last Vitals:  Vitals:   02/08/20 1315 02/08/20 1604  BP: (!) 135/83 (!) 137/85  Pulse: 62 78  Resp: 13 12  Temp:  (!) (P) 36.1 C  SpO2: 100% 99%    Last Pain:  Vitals:   02/08/20 1604  TempSrc:   PainSc: (P) 2                  Durell Lofaso COKER

## 2020-02-08 NOTE — Op Note (Signed)
Right inguinal Hernia, Open, Procedure Note repair with mesh   Indications: The patient presented with a history of a right, reducible inguinal  hernia.  The risk of hernia repair include bleeding,  Infection,   Recurrence of the hernia,  Mesh use, chronic pain,  Organ injury,  Bowel injury,  Bladder injury,   nerve injury with numbness around the incision,  Death,  and worsening of preexisting  medical problems.  The alternatives to surgery have been discussed as well..  Long term expectations of both operative and non operative treatments have been discussed.   The patient agrees to proceed.  Pre-operative Diagnosis: right inguinal hernia  reducible  Post-operative Diagnosis: same   Surgeon: Turner Daniels  MD   Assistants: none   Anesthesia: General endotracheal anesthesia and Local anesthesia 0.25.% bupivacaine, with epinephrine  ASA Class: 2  Procedure Details  The patient was seen again in the Holding Room. The risks, benefits, complications, treatment options, and expected outcomes were discussed with the patient. The possibilities of reaction to medication, pulmonary aspiration, perforation of viscus, bleeding, recurrent infection, the need for additional procedures, and development of a complication requiring transfusion or further operation were discussed with the patient and/or family. There was concurrence with the proposed plan, and informed consent was obtained. The site of surgery was properly noted/marked. The patient was taken to the Operating Room, identified as Leah Perkins, and the procedure verified as hernia repair. A Time Out was held and the above information confirmed.  The patient was placed in the supine position and underwent induction of anesthesia, the lower abdomen and groin was prepped and draped in the standard fashion, and 0.25% Marcaine with epinephrine was used to anesthetize the skin over the mid-portion of the inguinal canal. A transverse incision was made.  Dissection was carried through the soft tissue to expose the inguinal canal and inguinal ligament along its lower edge. The external oblique fascia was split along the course of its fibers, exposing the inguinal canal. The ligament was divided. The ilioinguinal and iliohypogastric nerve were divided since it was tethered under the mesh.. The defect was exposed and a piece of prolene hernia system ultrapro mesh was and placed into the indirect  the defect. Interupted 1-0 novafil suture was then used  to repair the defect, with the suture being sewn from the pubic tubercle inferiorly and superiorly along the canal to a level just beyond the internal ring. The external oblique fashion was then closed in a continuous fashion using 3-0 Vicryl suture taking care not to cause entrapment. Scarpa's layer closed with 3 0 vicryl and 4 0 monocryl used to close the skin.  Dermabond used for dressing.  Instrument, sponge, and needle counts were correct prior to closure and at the conclusion of the case.  Findings: Hernia as above  Estimated Blood Loss: Minimal         Drains: None         Total IV Fluids: per ICU record          Specimens: none                Complications: None; patient tolerated the procedure well.         Disposition: PACU - hemodynamically stable.         Condition: stable

## 2020-02-08 NOTE — H&P (Signed)
Leah Perkins  Location: [facility] Patient #: [id number] DOB: Jan 09, 1956 Married / Language: English / Race: White Female  History of Present Illness  Patient words: Patient presents for evaluation of a right groin mass. She is a history of ovarian cancer and is followed by gynecological oncology services. She was sent at the request of Dr. Delsa Sale evaluation of a right groin mass. This presented about 6 weeks ago. She underwent a computed tomography scan which showed a right groin mass and core biopsy was performed which was benign. The mass is more noticeable when she stands up. She has no nausea or vomiting. The mass is more painful especially when she is physically active or standing.  The patient is a 64 year old female.   Past Surgical History  Foot Surgery Right. Hysterectomy (due to cancer) - Complete  Diagnostic Studies History 10:18 AM) Colonoscopy 1-5 years ago Mammogram within last year  Allergies Sulfacetamide *CHEMICALS*  Medication History  Atenolol (25MG  Tablet, Oral) Active. Acetaminophen (100MG /ML Solution, Oral) Active. Calcium Carb-Cholecalciferol (600-400MG -UNIT Tablet, Oral) Active. Multiple Vitamin (Oral) Active. Vitamin C (100MG  Tablet, Oral) Active. Calcium + D (150-261-330MG -MG-IU Capsule, Oral) Active.  Social History Caffeine use Coffee. Tobacco use Never smoker.  Family History Cerebrovascular Accident Family Members In General. Hypertension Family Members In General.  Pregnancy / Birth History Maternal age 74-30     Review of Systems  General Not Present- Appetite Loss, Chills, Fatigue, Fever, Night Sweats, Weight Gain and Weight Loss. HEENT Present- Seasonal Allergies. Not Present- Earache, Hearing Loss, Hoarseness, Nose Bleed, Oral Ulcers, Ringing in the Ears, Sinus Pain, Sore Throat, Visual Disturbances, Wears glasses/contact lenses and Yellow Eyes. Female Genitourinary Not Present-  Frequency, Nocturia, Painful Urination, Pelvic Pain and Urgency. Musculoskeletal Not Present- Back Pain, Joint Pain, Joint Stiffness, Muscle Pain, Muscle Weakness and Swelling of Extremities. Neurological Not Present- Decreased Memory, Fainting, Headaches, Numbness, Seizures, Tingling, Tremor, Trouble walking and Weakness. Psychiatric Not Present- Anxiety, Bipolar, Change in Sleep Pattern, Depression, Fearful and Frequent crying.  Vitals 01/02/2020 10:02 AM Weight: 153.13 lb Height: 66in Body Surface Area: 1.79 m Body Mass Index: 24.71 kg/m  Temp.: 97.18F  Pulse: 65 (Regular)  BP: 138/78(Sitting, Left Arm, Standard)        Physical Exam  General Mental Status-Alert. General Appearance-Consistent with stated age. Hydration-Well hydrated. Voice-Normal.  Head and Neck Head-normocephalic, atraumatic with no lesions or palpable masses. Trachea-midline. Thyroid Gland Characteristics - normal size and consistency.  Chest and Lung Exam Chest and lung exam reveals -quiet, even and easy respiratory effort with no use of accessory muscles and on auscultation, normal breath sounds, no adventitious sounds and normal vocal resonance. Inspection Chest Wall - Normal. Back - normal.  Cardiovascular Cardiovascular examination reveals -normal heart sounds, regular rate and rhythm with no murmurs and normal pedal pulses bilaterally.  Abdomen Note: Reducible right inguinal hernia noted. No evidence of left inguinal hernia.  Neurologic Neurologic evaluation reveals -alert and oriented x 3 with no impairment of recent or remote memory. Mental Status-Normal.  Neuropsychiatric Examination of related systems reveals-The patient is well-nourished and well-groomed. Mental status exam performed with findings of-Oriented X3 with appropriate mood and affect.  Lymphatic Head & Neck  General Head & Neck Lymphatics: Bilateral - Description -  Normal. Axillary  General Axillary Region: Bilateral - Description - Normal. Tenderness - Non Tender. Femoral & Inguinal  Generalized Femoral & Inguinal Lymphatics: Bilateral - Description - Normal. Tenderness - Non Tender.    Assessment & Plan  RIGHT INGUINAL  HERNIA (K40.90) Impression: Recommend repair of symptomatic right inguinal hernia with mesh. The risk of hernia repair include bleeding, infection, organ injury, bowel injury, bladder injury, nerve injury recurrent hernia, blood clots, worsening of underlying condition, chronic pain, mesh use, open surgery, death, and the need for other operations. Pt agrees to proceed  Current Plans You are being scheduled for surgery- Our schedulers will call you.  You should hear from our office's scheduling department within 5 working days about the location, date, and time of surgery. We try to make accommodations for patient's preferences in scheduling surgery, but sometimes the OR schedule or the surgeon's schedule prevents [place] from making those accommodations.  If you have not heard from our office [phone]) in 5 working days, call the office and ask for your surgeon's nurse.  If you have other questions about your diagnosis, plan, or surgery, call the office and ask for your surgeon's nurse.  Pt Education - Pamphlet Given - Hernia Surgery: discussed with patient and provided information. The anatomy & physiology of the abdominal wall and pelvic floor was discussed. The pathophysiology of hernias in the inguinal and pelvic region was discussed. Natural history risks such as progressive enlargement, pain, incarceration, and strangulation was discussed. Contributors to complications such as smoking, obesity, diabetes, prior surgery, etc were discussed.  I feel the risks of no intervention will lead to serious problems that outweigh the operative risks; therefore, I recommended surgery to reduce and repair the hernia. I  explained laparoscopic techniques with possible need for an open approach. I noted usual use of mesh to patch and/or buttress hernia repair  Risks such as bleeding, infection, abscess, need for further treatment, heart attack, death, and other risks were discussed. I noted a good likelihood this will help address the problem. Goals of post-operative recovery were discussed as well. Possibility that this will not correct all symptoms was explained. I stressed the importance of low-impact activity, aggressive pain control, avoiding constipation, & not pushing through pain to minimize risk of post-operative chronic pain or injury. Possibility of reherniation was discussed. We will work to minimize complications.  An educational handout further explaining the pathology & treatment options was given as well. Questions were answered. The patient expresses understanding & wishes to proceed with surgery.

## 2020-02-08 NOTE — Anesthesia Procedure Notes (Signed)
Procedure Name: Intubation Date/Time: 02/08/2020 2:57 PM Performed by: Sammie Bench, CRNA Pre-anesthesia Checklist: Patient identified, Emergency Drugs available, Suction available and Patient being monitored Patient Re-evaluated:Patient Re-evaluated prior to induction Oxygen Delivery Method: Circle System Utilized Preoxygenation: Pre-oxygenation with 100% oxygen Induction Type: IV induction Ventilation: Mask ventilation without difficulty Laryngoscope Size: Mac and 3 Grade View: Grade II Tube type: Oral Tube size: 7.0 mm Number of attempts: 1 Airway Equipment and Method: Stylet and Oral airway Placement Confirmation: ETT inserted through vocal cords under direct vision,  positive ETCO2 and breath sounds checked- equal and bilateral Secured at: 21 cm Tube secured with: Tape Dental Injury: Teeth and Oropharynx as per pre-operative assessment

## 2020-02-08 NOTE — Transfer of Care (Signed)
Immediate Anesthesia Transfer of Care Note  Patient: CLIFFIE GINGRAS  Procedure(s) Performed: REPAIR RIGHT INGUINAL HERNIA WITH MESH (Right Inguinal)  Patient Location: PACU  Anesthesia Type:General  Level of Consciousness: awake, alert , oriented and patient cooperative  Airway & Oxygen Therapy: Patient Spontanous Breathing  Post-op Assessment: Report given to RN and Post -op Vital signs reviewed and stable  Post vital signs: Reviewed and stable  Last Vitals:  Vitals Value Taken Time  BP    Temp    Pulse 78 02/08/20 1603  Resp 12 02/08/20 1603  SpO2 99 % 02/08/20 1603  Vitals shown include unvalidated device data.  Last Pain:  Vitals:   02/08/20 1202  TempSrc: Oral  PainSc:          Complications: No complications documented.

## 2020-02-08 NOTE — Anesthesia Preprocedure Evaluation (Signed)
Anesthesia Evaluation  Patient identified by MRN, date of birth, ID band Patient awake    Reviewed: Allergy & Precautions, H&P , NPO status , Patient's Chart, lab work & pertinent test results  Airway Mallampati: II   Neck ROM: full    Dental   Pulmonary neg pulmonary ROS,    breath sounds clear to auscultation       Cardiovascular hypertension,  Rhythm:regular Rate:Normal     Neuro/Psych    GI/Hepatic   Endo/Other    Renal/GU      Musculoskeletal   Abdominal   Peds  Hematology   Anesthesia Other Findings   Reproductive/Obstetrics Ovarian CA                             Anesthesia Physical Anesthesia Plan  ASA: II  Anesthesia Plan: General   Post-op Pain Management:  Regional for Post-op pain   Induction: Intravenous  PONV Risk Score and Plan: 3 and Ondansetron, Dexamethasone, Midazolam and Treatment may vary due to age or medical condition  Airway Management Planned: Oral ETT  Additional Equipment:   Intra-op Plan:   Post-operative Plan: Extubation in OR  Informed Consent: I have reviewed the patients History and Physical, chart, labs and discussed the procedure including the risks, benefits and alternatives for the proposed anesthesia with the patient or authorized representative who has indicated his/her understanding and acceptance.       Plan Discussed with: CRNA, Anesthesiologist and Surgeon  Anesthesia Plan Comments:         Anesthesia Quick Evaluation

## 2020-02-08 NOTE — Interval H&P Note (Signed)
History and Physical Interval Note:  02/08/2020 2:08 PM  Leah Perkins  has presented today for surgery, with the diagnosis of RIGHT INGUINAL HERNIA.  The various methods of treatment have been discussed with the patient and family. After consideration of risks, benefits and other options for treatment, the patient has consented to  Procedure(s) with comments: Grill (Right) - GENERAL AND TAP BLOCK as a surgical intervention.  The patient's history has been reviewed, patient examined, no change in status, stable for surgery.  I have reviewed the patient's chart and labs.  Questions were answered to the patient's satisfaction.     Turner Daniels MD

## 2020-02-08 NOTE — Anesthesia Procedure Notes (Signed)
Anesthesia Regional Block: TAP block   Pre-Anesthetic Checklist: ,, timeout performed, Correct Patient, Correct Site, Correct Laterality, Correct Procedure, Correct Position, site marked, Risks and benefits discussed,  Surgical consent,  Pre-op evaluation,  At surgeon's request and post-op pain management  Laterality: Right  Prep: chloraprep       Needles:  Injection technique: Single-shot  Needle Type: Echogenic Needle     Needle Length: 9cm  Needle Gauge: 21     Additional Needles:   Narrative:  Start time: 02/08/2020 1:04 PM End time: 02/08/2020 1:12 PM Injection made incrementally with aspirations every 5 mL.  Performed by: Personally  Anesthesiologist: Albertha Ghee, MD  Additional Notes: Pt tolerated the procedure well.

## 2020-02-08 NOTE — Discharge Instructions (Signed)
[facility] _______[facility] ° °UMBILICAL OR INGUINAL HERNIA REPAIR: POST OP INSTRUCTIONS ° °Always review your discharge instruction sheet given to you by the facility where your surgery was performed. °IF YOU HAVE DISABILITY OR FAMILY LEAVE FORMS, YOU MUST BRING THEM TO THE OFFICE FOR PROCESSING.   °DO NOT GIVE THEM TO YOUR DOCTOR. ° °1. A  prescription for pain medication may be given to you upon discharge.  Take your pain medication as prescribed, if needed.  If narcotic pain medicine is not needed, then you may take acetaminophen (Tylenol) or ibuprofen (Advil) as needed. °2. Take your usually prescribed medications unless otherwise directed. °If you need a refill on your pain medication, please contact your pharmacy.  They will contact our office to request authorization. Prescriptions will not be filled after 5 pm or on week-ends. °3. You should follow a light diet the first 24 hours after arrival home, such as soup and crackers, etc.  Be sure to include lots of fluids daily.  Resume your normal diet the day after surgery. °4.Most patients will experience some swelling and bruising around the umbilicus or in the groin and scrotum.  Ice packs and reclining will help.  Swelling and bruising can take several days to resolve.  °6. It is common to experience some constipation if taking pain medication after surgery.  Increasing fluid intake and taking a stool softener (such as Colace) will usually help or prevent this problem from occurring.  A mild laxative (Milk of Magnesia or Miralax) should be taken according to package directions if there are no bowel movements after 48 hours. °7. Unless discharge instructions indicate otherwise, you may remove your bandages 24-48 hours after surgery, and you may shower at that time.  You may have steri-strips (small skin tapes) in place directly over the incision.  These strips should be left on the skin for 7-10 days.  If your surgeon used skin glue on the  incision, you may shower in 24 hours.  The glue will flake off over the next 2-3 weeks.  Any sutures or staples will be removed at the office during your follow-up visit. °8. ACTIVITIES:  You may resume regular (light) daily activities beginning the next day--such as daily self-care, walking, climbing stairs--gradually increasing activities as tolerated.  You may have sexual intercourse when it is comfortable.  Refrain from any heavy lifting or straining until approved by your doctor. ° °a.You may drive when you are no longer taking prescription pain medication, you can comfortably wear a seatbelt, and you can safely maneuver your car and apply brakes. °b.RETURN TO WORK:   °_____________________________________________ ° °9.You should see your doctor in the office for a follow-up appointment approximately 2-3 weeks after your surgery.  Make sure that you call for this appointment within a day or two after you arrive home to insure a convenient appointment time. °10.OTHER INSTRUCTIONS: _________________________ °   _____________________________________ ° °WHEN TO CALL YOUR DOCTOR: °1. Fever over 101.0 °2. Inability to urinate °3. Nausea and/or vomiting °4. Extreme swelling or bruising °5. Continued bleeding from incision. °6. Increased pain, redness, or drainage from the incision ° °The clinic staff is available to answer your questions during regular business hours.  Please don’t hesitate to call and ask to speak to one of the nurses for clinical concerns.  If you have a medical emergency, go to the nearest emergency room or call 911.  A surgeon from [facility] is always on call at the hospital ° ° °  [address], [address], [place], [place]  [address] ? ° [address] [address], [place], [place]   [address] °[phone] ? [phone] ? FAX [phone] °Web site: [email or web address] °

## 2020-02-09 ENCOUNTER — Encounter (HOSPITAL_COMMUNITY): Payer: Self-pay | Admitting: Surgery

## 2020-02-14 ENCOUNTER — Encounter: Payer: Self-pay | Admitting: Family Medicine

## 2020-02-14 ENCOUNTER — Ambulatory Visit (INDEPENDENT_AMBULATORY_CARE_PROVIDER_SITE_OTHER): Payer: 59 | Admitting: Family Medicine

## 2020-02-14 ENCOUNTER — Other Ambulatory Visit: Payer: Self-pay

## 2020-02-14 VITALS — BP 116/78 | HR 51 | Temp 97.9°F | Resp 18 | Ht 66.0 in | Wt 150.8 lb

## 2020-02-14 DIAGNOSIS — Z131 Encounter for screening for diabetes mellitus: Secondary | ICD-10-CM

## 2020-02-14 DIAGNOSIS — E782 Mixed hyperlipidemia: Secondary | ICD-10-CM

## 2020-02-14 DIAGNOSIS — Z1231 Encounter for screening mammogram for malignant neoplasm of breast: Secondary | ICD-10-CM | POA: Diagnosis not present

## 2020-02-14 DIAGNOSIS — Z0000 Encounter for general adult medical examination without abnormal findings: Secondary | ICD-10-CM

## 2020-02-14 DIAGNOSIS — I10 Essential (primary) hypertension: Secondary | ICD-10-CM

## 2020-02-14 LAB — LIPID PANEL
Cholesterol: 203 mg/dL — ABNORMAL HIGH (ref 0–200)
HDL: 73.8 mg/dL (ref >39.00)
LDL Cholesterol: 119 mg/dL — ABNORMAL HIGH (ref 0–99)
NonHDL: 129.46
Total CHOL/HDL Ratio: 3
Triglycerides: 53 mg/dL (ref 0.0–149.0)
VLDL: 10.6 mg/dL (ref 0.0–40.0)

## 2020-02-14 LAB — HEMOGLOBIN A1C: Hgb A1c MFr Bld: 6 % (ref 4.6–6.5)

## 2020-02-14 LAB — TSH: TSH: 1.44 u[IU]/mL (ref 0.35–4.50)

## 2020-02-14 MED ORDER — ATENOLOL 25 MG PO TABS: 25.0000 mg | ORAL_TABLET | Freq: Every day | ORAL | 1 refills | Status: DC

## 2020-02-14 NOTE — Patient Instructions (Signed)
Health Maintenance, Female Adopting a healthy lifestyle and getting preventive care are important in promoting health and wellness. Ask your health care provider about:  The right schedule for you to have regular tests and exams.  Things you can do on your own to prevent diseases and keep yourself healthy. What should I know about diet, weight, and exercise? Eat a healthy diet   Eat a diet that includes plenty of vegetables, fruits, low-fat dairy products, and lean protein.  Do not eat a lot of foods that are high in solid fats, added sugars, or sodium. Maintain a healthy weight Body mass index (BMI) is used to identify weight problems. It estimates body fat based on height and weight. Your health care provider can help determine your BMI and help you achieve or maintain a healthy weight. Get regular exercise Get regular exercise. This is one of the most important things you can do for your health. Most adults should:  Exercise for at least 150 minutes each week. The exercise should increase your heart rate and make you sweat (moderate-intensity exercise).  Do strengthening exercises at least twice a week. This is in addition to the moderate-intensity exercise.  Spend less time sitting. Even light physical activity can be beneficial. Watch cholesterol and blood lipids Have your blood tested for lipids and cholesterol at 65 years of age, then have this test every 5 years. Have your cholesterol levels checked more often if:  Your lipid or cholesterol levels are high.  You are older than 65 years of age.  You are at high risk for heart disease. What should I know about cancer screening? Depending on your health history and family history, you may need to have cancer screening at various ages. This may include screening for:  Breast cancer.  Cervical cancer.  Colorectal cancer.  Skin cancer.  Lung cancer. What should I know about heart disease, diabetes, and high blood  pressure? Blood pressure and heart disease  High blood pressure causes heart disease and increases the risk of stroke. This is more likely to develop in people who have high blood pressure readings, are of African descent, or are overweight.  Have your blood pressure checked: ? Every 3-5 years if you are 18-39 years of age. ? Every year if you are 40 years old or older. Diabetes Have regular diabetes screenings. This checks your fasting blood sugar level. Have the screening done:  Once every three years after age 40 if you are at a normal weight and have a low risk for diabetes.  More often and at a younger age if you are overweight or have a high risk for diabetes. What should I know about preventing infection? Hepatitis B If you have a higher risk for hepatitis B, you should be screened for this virus. Talk with your health care provider to find out if you are at risk for hepatitis B infection. Hepatitis C Testing is recommended for:  Everyone born from 1945 through 1965.  Anyone with known risk factors for hepatitis C. Sexually transmitted infections (STIs)  Get screened for STIs, including gonorrhea and chlamydia, if: ? You are sexually active and are younger than 65 years of age. ? You are older than 65 years of age and your health care provider tells you that you are at risk for this type of infection. ? Your sexual activity has changed since you were last screened, and you are at increased risk for chlamydia or gonorrhea. Ask your health care provider if   you are at risk.  Ask your health care provider about whether you are at high risk for HIV. Your health care provider may recommend a prescription medicine to help prevent HIV infection. If you choose to take medicine to prevent HIV, you should first get tested for HIV. You should then be tested every 3 months for as long as you are taking the medicine. Pregnancy  If you are about to stop having your period (premenopausal) and  you may become pregnant, seek counseling before you get pregnant.  Take 400 to 800 micrograms (mcg) of folic acid every day if you become pregnant.  Ask for birth control (contraception) if you want to prevent pregnancy. Osteoporosis and menopause Osteoporosis is a disease in which the bones lose minerals and strength with aging. This can result in bone fractures. If you are 65 years old or older, or if you are at risk for osteoporosis and fractures, ask your health care provider if you should:  Be screened for bone loss.  Take a calcium or vitamin D supplement to lower your risk of fractures.  Be given hormone replacement therapy (HRT) to treat symptoms of menopause. Follow these instructions at home: Lifestyle  Do not use any products that contain nicotine or tobacco, such as cigarettes, e-cigarettes, and chewing tobacco. If you need help quitting, ask your health care provider.  Do not use street drugs.  Do not share needles.  Ask your health care provider for help if you need support or information about quitting drugs. Alcohol use  Do not drink alcohol if: ? Your health care provider tells you not to drink. ? You are pregnant, may be pregnant, or are planning to become pregnant.  If you drink alcohol: ? Limit how much you use to 0-1 drink a day. ? Limit intake if you are breastfeeding.  Be aware of how much alcohol is in your drink. In the [place] one drink equals one 12 oz bottle of beer (355 mL), one 5 oz glass of wine (148 mL), or one 1 oz glass of hard liquor (44 mL). General instructions  Schedule regular health, dental, and eye exams.  Stay current with your vaccines.  Tell your health care provider if: ? You often feel depressed. ? You have ever been abused or do not feel safe at home. Summary  Adopting a healthy lifestyle and getting preventive care are important in promoting health and wellness.  Follow your health care provider's instructions about healthy  diet, exercising, and getting tested or screened for diseases.  Follow your health care provider's instructions on monitoring your cholesterol and blood pressure. This information is not intended to replace advice given to you by your health care provider. Make sure you discuss any questions you have with your health care provider. Document Revised: 06/22/2018 Document Reviewed: 06/22/2018 Elsevier Patient Education  2020 Elsevier Inc.  

## 2020-02-14 NOTE — Progress Notes (Signed)
This visit occurred during the SARS-CoV-2 public health emergency.  Safety protocols were in place, including screening questions prior to the visit, additional usage of staff PPE, and extensive cleaning of exam room while observing appropriate contact time as indicated for disinfecting solutions.    Patient ID: Leah Perkins, female  DOB: 1956/05/14, 64 y.o.   MRN: [id number] Patient Care Team    Relationship Specialty Notifications Start End  Ma Hillock, DO PCP - General Family Medicine  02/13/19   Janie Morning, MD Attending Physician Obstetrics and Gynecology  02/13/19   Lyndal Pulley, [place] Physician Sports Medicine  02/13/19     Chief Complaint  Patient presents with  . Annual Exam    Pt states fasting.     Subjective:  Leah Perkins is a 64 y.o.  Female  present for CPE. All past medical history, surgical history, allergies, family history, immunizations, medications and social history were updated in the electronic medical record today. All recent labs, ED visits and hospitalizations within the last year were reviewed.  Hypertension/HLD: fasting Pt reports complinace with atenolol. Blood pressures ranges at home WNL. Patient denies chest pain, shortness of breath, dizziness or lower extremity edema.  Diet: low sodium Exercise: routine RF: htn, hld, overweight, fhx.   Health maintenance:  Colonoscopy: completed 2019, by Dr. Paulita Fujita, resutls "normal"  follow up 3-5 years. Mammogram: completed:08/2019- [facility] placed order for 2022 for her Cervical cancer screening: hysterectomy- for ovarian cancer>>continue to follow with gyn Immunizations: tdap 2015 UTD, Influenza (encouraged yearly), shingrix completed. covid series completed Infectious disease screening: HIV and Hep C pt reported she had completed.  DEXA: completed 04/07/2016- WNL Assistive device: none Oxygen WSF:KCLE Patient has a Dental home. Hospitalizations/ED visits: reviewed   Depression screen  [facility] 2/9 08/15/2019 02/13/2019  Decreased Interest 0 0  Down, Depressed, Hopeless 0 0  PHQ - 2 Score 0 0   No flowsheet data found.   Immunization History  Administered Date(s) Administered  . Influenza,inj,Quad PF,6+ Mos 05/02/2019  . PFIZER SARS-COV-2 Vaccination 07/24/2019, 08/12/2019  . Zoster Recombinat (Shingrix) 02/13/2019, 05/16/2019   Past Medical History:  Diagnosis Date  . Allergy   . Chicken pox   . Hypertension   . 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 UTI (urinary tract infection)    Patient denies this dx   Allergies  Allergen Reactions  . Lisinopril     Decreased renal fx while on- resolved when dc'd  . Sulfa Antibiotics     "does not remember"   Past Surgical History:  Procedure Laterality Date  . ABDOMINAL HYSTERECTOMY  09/2011   TAHBSO with appendectomy and optimal tumor debulking  . APPENDECTOMY  2009   w/ hysterectomy  . COLONOSCOPY    . DILATION AND CURETTAGE OF UTERUS  1996  . HAMMER TOE SURGERY Right 2017  . INGUINAL HERNIA REPAIR Right 02/08/2020   Procedure: REPAIR RIGHT INGUINAL HERNIA WITH MESH;  Surgeon: Erroll Luna, MD;  Location: [place];  Service: General;  Laterality: Right;  GENERAL AND TAP BLOCK  . KNEE ARTHROSCOPY Right 2009   Knee  . TONSILLECTOMY  1991  . WISDOM TOOTH EXTRACTION  1985   Family History  Problem Relation Age of Onset  . Hypertension Mother   . Hypertrophic cardiomyopathy Mother 4       pt negativ rofr gene 08/05/12  . Early  death Mother   . Alzheimer's disease Father   . Cancer Brother        Squamous cell of sinus, hx smok inhalation Engineer, drilling)  . Early death Brother   . Alzheimer's disease Paternal Grandmother   . Hypertrophic cardiomyopathy Son   . Early death Son   . Heart attack Paternal Grandfather 74  . Lymphoma Paternal Uncle        late 50's/early 82's  . Miscarriages / [place] Daughter    . Heart attack Brother   . Early death Son    Social History   Social History Narrative   Marital status/children/pets: Married, 3 children. Has grandchildren   Education/employment: [employer]., retired   Engineer, materials:      -Wears a bicycle helmet riding a bike: Yes     -smoke alarm in the home:Yes     - wears seatbelt: Yes     - Feels safe in their relationships: Yes    Allergies as of 02/14/2020      Reactions   Lisinopril    Decreased renal fx while on- resolved when dc'd   Sulfa Antibiotics    "does not remember"      Medication List       Accurate as of February 14, 2020  8:59 AM. If you have any questions, ask your nurse or doctor.        STOP taking these medications   acetaminophen 500 MG tablet Commonly known as: TYLENOL Stopped by: Howard Pouch, DO   ibuprofen 800 MG tablet Commonly known as: ADVIL Stopped by: Howard Pouch, DO   oxyCODONE 5 MG immediate release tablet Commonly known as: Oxy IR/ROXICODONE Stopped by: Howard Pouch, DO     TAKE these medications   atenolol 25 MG tablet Commonly known as: TENORMIN Take 1 tablet (25 mg total) by mouth daily.   CALCIUM 600 + D PO Take 1 tablet by mouth in the morning and at bedtime.   multivitamin with minerals tablet Take 1 tablet by mouth daily.   vitamin C 500 MG tablet Commonly known as: ASCORBIC ACID Take 500 mg by mouth 2 (two) times daily.       All past medical history, surgical history, allergies, family history, immunizations andmedications were updated in the EMR today and reviewed under the history and medication portions of their EMR.     No results found.  ROS: 14 pt review of systems performed and negative (unless mentioned in an HPI)  Objective: BP 116/78 (BP Location: Left Arm, Patient Position: Sitting, Cuff Size: Normal)   Pulse (!) 51   Temp 97.9 F (36.6 C) (Oral)   Resp 18   Ht 5\' 6"  (1.676 m)   Wt 150 lb 12.8 oz (68.4 kg)   LMP  (LMP Unknown)   SpO2 100%   BMI 24.34 kg/m   Gen: Afebrile. No acute distress. Nontoxic in appearance, well-developed, well-nourished,  Pleasant female.  HENT: AT. [place]. Bilateral TM visualized and normal in appearance, normal external auditory canal. MMM, no oral lesions, adequate dentition. Bilateral nares within normal limits. Throat without erythema, ulcerations or exudates. no Cough on exam, no hoarseness on exam. Eyes:Pupils Equal Round Reactive to light, Extraocular movements intact,  Conjunctiva without redness, discharge or icterus. Neck/lymp/endocrine: Supple,no lymphadenopathy, no thyromegaly CV: RRR no murmur, no edema, +2/4 P posterior tibialis pulses. No JVD. Chest: CTAB, no wheeze, rhonchi or crackles. normal Respiratory effort. good Air movement. Abd: Soft. RLQ incision- no erythema, no drainage, mild bruising and swelling well  approximated. flat. NTND. BS present. no Masses palpated. No hepatosplenomegaly. No rebound tenderness or guarding. Skin: no rashes, purpura or petechiae. Warm and well-perfused. Skin intact. Neuro/Msk:  Normal gait. PERLA. EOMi. Alert. Oriented x3.  Cranial nerves II through XII intact. Muscle strength 5/5 upper/lower extremity. DTRs equal bilaterally. Psych: Normal affect, dress and demeanor. Normal speech. Normal thought content and judgment.  No exam data present  Assessment/plan: Leah Perkins is a 64 y.o. female present for CPE Essential hypertension/HLD Stable.  Continue atenolol. She will monitor BP/HR- can take 1/2 tab if HR<60 and sx present. She understands balance btw normal BP and HR (if sx present only).  - TSH - Lipid panel 5.5 mos.   Breast cancer screening by mammogram - MM 3D SCREEN BREAST BILATERAL; Future Diabetes mellitus screening - Hemoglobin A1c  Encounter for preventive health examination Patient was encouraged to exercise greater than 150 minutes a week. Patient was encouraged to choose a diet filled with fresh fruits and vegetables, and lean meats. AVS provided to  patient today for education/recommendation on gender specific health and safety maintenance. Colonoscopy: completed 2019, by Dr. Paulita Fujita, resutls "normal"  follow up 3-5 years. Mammogram: completed:08/2019- [facility] placed order for 2022 for her Cervical cancer screening: hysterectomy- for ovarian cancer>>continue to follow with gyn Immunizations: tdap 2015 UTD, Influenza (encouraged yearly), shingrix completed. covid series completed Infectious disease screening: HIV and Hep C pt reported she had completed.  DEXA: completed 04/07/2016- WNL  Return in about 6 months (around 08/05/2020) for [facility] (30 min) and 1 yr cpe.  Orders Placed This Encounter  Procedures  . MM 3D SCREEN BREAST BILATERAL  . TSH  . Hemoglobin A1c  . Lipid panel    Meds ordered this encounter  Medications  . atenolol (TENORMIN) 25 MG tablet    Sig: Take 1 tablet (25 mg total) by mouth daily.    Dispense:  90 tablet    Refill:  1   Referral Orders  No referral(s) requested today     Electronically signed by: Howard Pouch, [place]

## 2020-03-05 ENCOUNTER — Ambulatory Visit: Payer: 59 | Attending: Internal Medicine

## 2020-03-05 DIAGNOSIS — Z23 Encounter for immunization: Secondary | ICD-10-CM

## 2020-03-05 NOTE — Progress Notes (Signed)
   Covid-19 Vaccination Clinic  Name:  Leah Perkins    MRN: [id number] DOB: 11-06-1955  03/05/2020  Leah Perkins was observed post Covid-19 immunization for 15 minutes without incident. She was provided with Vaccine Information Sheet and instruction to access the V-Safe system.   Leah Perkins was instructed to call 911 with any severe reactions post vaccine: Marland Kitchen Difficulty breathing  . Swelling of face and throat  . A fast heartbeat  . A bad rash all over body  . Dizziness and weakness

## 2020-03-19 ENCOUNTER — Other Ambulatory Visit: Payer: 59

## 2020-03-19 ENCOUNTER — Other Ambulatory Visit: Payer: Self-pay

## 2020-03-19 DIAGNOSIS — Z20822 Contact with and (suspected) exposure to covid-19: Secondary | ICD-10-CM

## 2020-03-21 LAB — NOVEL CORONAVIRUS, NAA: SARS-CoV-2, NAA: NOT DETECTED

## 2020-03-21 LAB — SARS-COV-2, NAA 2 DAY TAT

## 2020-07-25 ENCOUNTER — Other Ambulatory Visit: Payer: Self-pay | Admitting: Family Medicine

## 2020-08-14 ENCOUNTER — Other Ambulatory Visit: Payer: Self-pay

## 2020-08-14 ENCOUNTER — Encounter: Payer: Self-pay | Admitting: Family Medicine

## 2020-08-14 ENCOUNTER — Ambulatory Visit (INDEPENDENT_AMBULATORY_CARE_PROVIDER_SITE_OTHER): Payer: Medicare HMO | Admitting: Family Medicine

## 2020-08-14 VITALS — BP 126/85 | HR 59 | Temp 97.6°F | Ht 66.0 in | Wt 150.0 lb

## 2020-08-14 DIAGNOSIS — Z23 Encounter for immunization: Secondary | ICD-10-CM

## 2020-08-14 DIAGNOSIS — E782 Mixed hyperlipidemia: Secondary | ICD-10-CM | POA: Diagnosis not present

## 2020-08-14 DIAGNOSIS — I10 Essential (primary) hypertension: Secondary | ICD-10-CM | POA: Diagnosis not present

## 2020-08-14 MED ORDER — ATENOLOL 25 MG PO TABS: 12.5000 mg | ORAL_TABLET | Freq: Every day | ORAL | 1 refills | Status: DC

## 2020-08-14 NOTE — Patient Instructions (Signed)
Next appt week of 02/17/2021 for physical and fasting labs  Nice to see you today.  I have refilled your medication.

## 2020-08-14 NOTE — Progress Notes (Signed)
This visit occurred during the SARS-CoV-2 public health emergency.  Safety protocols were in place, including screening questions prior to the visit, additional usage of staff PPE, and extensive cleaning of exam room while observing appropriate contact time as indicated for disinfecting solutions.    Patient ID: Leah Perkins, female  DOB: [birth year], 65 y.o.   MRN: [id number] Patient Care Team    Relationship Specialty Notifications Start End  Ma Hillock, DO PCP - General Family Medicine  02/13/19   Janie Morning, MD Attending Physician Obstetrics and Gynecology  02/13/19   Lyndal Pulley, [place] Physician Sports Medicine  02/13/19     Chief Complaint  Patient presents with  . Follow-up    [facility]; pt is fasting    Subjective:  Leah Perkins is a 65 y.o.  Female  present for  Hypertension/HLD: fasting Pt reports compliance  with atenolol 12.5 mg qd. Blood pressures ranges at home WNL. Patient denies chest pain, shortness of breath, dizziness or lower extremity edema.  Diet: low sodium Exercise: routine RF: htn, hld, overweight, fhx.   Depression screen [facility] 2/9 08/14/2020 08/15/2019 02/13/2019  Decreased Interest 0 0 0  Down, Depressed, Hopeless 0 0 0  PHQ - 2 Score 0 0 0   No flowsheet data found.   Immunization History  Administered Date(s) Administered  . Fluad Quad(high Dose 65+) 08/14/2020  . Influenza,inj,Quad PF,6+ Mos 05/02/2019  . PFIZER(Purple Top)SARS-COV-2 Vaccination 07/24/2019, 08/12/2019, 03/05/2020  . Pneumococcal Conjugate-13 08/14/2020  . Zoster Recombinat (Shingrix) 02/13/2019, 05/16/2019   Past Medical History:  Diagnosis Date  . Allergy   . Chicken pox   . Hypertension   . 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 UTI (urinary tract infection)    Patient denies this dx   Allergies  Allergen Reactions  . Lisinopril     Decreased renal fx  while on- resolved when dc'd  . Sulfa Antibiotics     "does not remember"   Past Surgical History:  Procedure Laterality Date  . ABDOMINAL HYSTERECTOMY  09/2011   TAHBSO with appendectomy and optimal tumor debulking  . APPENDECTOMY  2009   w/ hysterectomy  . COLONOSCOPY    . DILATION AND CURETTAGE OF UTERUS  1996  . HAMMER TOE SURGERY Right 2017  . INGUINAL HERNIA REPAIR Right 02/08/2020   Procedure: REPAIR RIGHT INGUINAL HERNIA WITH MESH;  Surgeon: Erroll Luna, MD;  Location: [place];  Service: General;  Laterality: Right;  GENERAL AND TAP BLOCK  . KNEE ARTHROSCOPY Right 2009   Knee  . TONSILLECTOMY  1991  . WISDOM TOOTH EXTRACTION  1985   Family History  Problem Relation Age of Onset  . Hypertension Mother   . Hypertrophic cardiomyopathy Mother 60       pt negativ rofr gene 08-24-2012  . Early death Mother   . Alzheimer's disease Father   . Cancer Brother        Squamous cell of sinus, hx smok inhalation Engineer, drilling)  . Early death Brother   . Alzheimer's disease Paternal Grandmother   . Hypertrophic cardiomyopathy Son   . Early death Son   . Heart attack Paternal Grandfather 74  . Lymphoma Paternal Uncle        late 50's/early 4's  . Miscarriages / [place] Daughter   . Heart attack Brother   . Early death Son  Social History   Social History Narrative   Marital status/children/pets: Married, 3 children. Has grandchildren   Education/employment: [employer]., retired   Engineer, materials:      -Wears a bicycle helmet riding a bike: Yes     -smoke alarm in the home:Yes     - wears seatbelt: Yes     - Feels safe in their relationships: Yes    Allergies as of 08/14/2020      Reactions   Lisinopril    Decreased renal fx while on- resolved when dc'd   Sulfa Antibiotics    "does not remember"      Medication List       Accurate as of August 14, 2020  9:34 AM. If you have any questions, ask your nurse or doctor.        atenolol 25 MG tablet Commonly known as:  TENORMIN Take 0.5 tablets (12.5 mg total) by mouth daily. What changed: how much to take Changed by: Howard Pouch, DO   CALCIUM 600 + D PO Take 1 tablet by mouth in the morning and at bedtime.   multivitamin with minerals tablet Take 1 tablet by mouth daily.   vitamin C 500 MG tablet Commonly known as: ASCORBIC ACID Take 500 mg by mouth 2 (two) times daily.       All past medical history, surgical history, allergies, family history, immunizations andmedications were updated in the EMR today and reviewed under the history and medication portions of their EMR.     No results found.  ROS: 14 pt review of systems performed and negative (unless mentioned in an HPI)  Objective: BP 126/85   Pulse (!) 59   Temp 97.6 F (36.4 C) (Oral)   Ht 5\' 6"  (1.676 m)   Wt 150 lb (68 kg)   LMP  (LMP Unknown)   SpO2 100%   BMI 24.21 kg/m  Gen: Afebrile. No acute distress. Nontoxic pleasant female.  HENT: AT. [place].  Eyes:Pupils Equal Round Reactive to light, Extraocular movements intact,  Conjunctiva without redness, discharge or icterus. Neck/lymp/endocrine: Supple,no lymphadenopathy, no thyromegaly CV: RRR no murmur, no edema, +2/4 P posterior tibialis pulses Chest: CTAB, no wheeze or crackles Abd: Soft. NTND. BS present. No Masses palpated.  Skin: no rashes, purpura or petechiae.  Neuro:  Normal gait. PERLA. EOMi. Alert. Oriented x3 Psych: Normal affect, dress and demeanor. Normal speech. Normal thought content and judgment.    No exam data present  Assessment/plan: Leah Perkins is a 65 y.o. female present for CPE Essential hypertension/HLD Stable.  Continue  atenolol. She will monitor BP/HR- can take 1/2 tab if HR<60 and sx present. She understands balance btw normal BP and HR (if sx present only).  Next appt 5.5 mos.   Need for influenza vaccination - Flu Vaccine QUAD High Dose(Fluad)  Need for pneumococcal vaccination - Pneumococcal conjugate vaccine 13-valent  IM    Return in about 6 months (around 02/17/2021) for CPE (30 min), [facility] (30 min).  Orders Placed This Encounter  Procedures  . Flu Vaccine QUAD High Dose(Fluad)  . Pneumococcal conjugate vaccine 13-valent IM    Meds ordered this encounter  Medications  . atenolol (TENORMIN) 25 MG tablet    Sig: Take 0.5 tablets (12.5 mg total) by mouth daily.    Dispense:  45 tablet    Refill:  1   Referral Orders  No referral(s) requested today     Electronically signed by: Howard Pouch, [place]

## 2020-08-21 ENCOUNTER — Ambulatory Visit
Admission: RE | Admit: 2020-08-21 | Discharge: 2020-08-21 | Disposition: A | Discharge status: Home / Self Care | Payer: Medicare HMO | Source: Ambulatory Visit | Attending: Family Medicine | Admitting: Family Medicine

## 2020-08-21 ENCOUNTER — Other Ambulatory Visit: Payer: Self-pay

## 2020-08-21 DIAGNOSIS — Z1231 Encounter for screening mammogram for malignant neoplasm of breast: Secondary | ICD-10-CM

## 2020-11-07 ENCOUNTER — Telehealth: Payer: Self-pay | Admitting: *Deleted

## 2020-11-07 NOTE — Telephone Encounter (Signed)
Patient called and scheduled a follow up appt with Dr Jackson-Moore  

## 2020-11-12 ENCOUNTER — Encounter: Payer: Self-pay | Admitting: Obstetrics & Gynecology

## 2020-11-12 NOTE — Progress Notes (Signed)
Follow Up Note: Gyn-Onc  Leah Perkins 65 y.o. female  CC: She presents for a follow-up visit  HPI:  Oncology History  Ovarian cancer f/u  09/23/2011 Initial Diagnosis   IIC serous ovarian cancer treated in [place]    - 03/21/2012 Chemotherapy   s/p 6 cycles of dose dense with paclitaxel and carboplatin   12/08/2017 Genetic Testing   The Common Hereditary Cancers panel + Myelodysplastic syndrome/Leukemia panel was ordered. The following genes were evaluated for sequence changes and exonic deletions/duplications: APC, ATM, AXIN2, BARD1, BLM, BMPR1A, BRCA1, BRCA2, BRIP1, CDH1, CDK4, CDKN2A (p14ARF), CDKN2A (p16INK4a), CEBPA,CHEK2, CTNNA1, DICER1, EPCAM*, GATA2, GREM1*, HRAS, KIT, MEN1, MLH1, MSH2, MSH3, MSH6, MUTYH, NBN, NF1, PALB2, PDGFRA, PMS2, POLD1, POLE, PTEN, RAD50, RAD51C, RAD51D, RUNX1, SDHB, SDHC, SDHD, SMAD4, SMARCA4, STK11, TERC, TERT, TP53, TSC1, TSC2, VHL. The following genes were evaluated for sequence changes only: HOXB13*, NTHL1*, SDHA  Results: Negative, no pathogenic variants identified.  The date of this test report is 12/08/2017.    11/15/2019 Tumor Marker   Patient's tumor was tested for the following markers: CA 125 Results of the tumor marker test revealed: 15.1     Interval History:   She denies any abdominal pain/distenstion, weight loss or change in bowl habits.  Since her last visit she was diagnosed w/a right inguinal hernia.  She s/p herniorrhaphy.  She c/o new onset urgency; no incontinence.   Review of Systems  Review of Systems  Constitutional: Negative for malaise/fatigue.  Gastrointestinal: Negative for abdominal pain, constipation, diarrhea, nausea and vomiting.    Current Meds:  Outpatient Encounter Medications as of 11/13/2020  Medication Sig  . atenolol (TENORMIN) 25 MG tablet Take 0.5 tablets (12.5 mg total) by mouth daily.  . Calcium Carb-Cholecalciferol (CALCIUM 600 + D PO)  Take 1 tablet by mouth in the morning and at bedtime.   . Multiple Vitamins-Minerals (MULTIVITAMIN WITH MINERALS) tablet Take 1 tablet by mouth daily.  . vitamin C (ASCORBIC ACID) 500 MG tablet Take 500 mg by mouth 2 (two) times daily.   No facility-administered encounter medications on file as of 11/13/2020.    Allergy:  Allergies  Allergen Reactions  . Lisinopril     Decreased renal fx while on- resolved when dc'd  . Sulfa Antibiotics     "does not remember"    Social Hx:   Social History   Socioeconomic History  . Marital status: Married    Spouse name: Not on file  . Number of children: Not on file  . Years of education: Not on file  . Highest education level: Not on file  Occupational History  . Not on file  Tobacco Use  . Smoking status: Never Smoker  . Smokeless tobacco: Never Used  Vaping Use  . Vaping Use: Never used  Substance and Sexual Activity  . Alcohol use: Yes    Comment: Socially ~1 / week  . Drug use: No  . Sexual activity: Yes    Partners: Male    Birth control/protection: Surgical    Comment: Hysterectomy  Other Topics Concern  . Not on file  Social History Narrative   Marital status/children/pets: Married, 3 children. Has grandchildren   Education/employment: [employer] retired   Field seismologist:      -Wears a bicycle helmet riding a bike: Yes     -smoke alarm in the home:Yes     - wears seatbelt: Yes     - Feels safe in their relationships: Yes   Social Determinants of Health  Financial Resource Strain: Not on file  Food Insecurity: Not on file  Transportation Needs: Not on file  Physical Activity: Not on file  Stress: Not on file  Social Connections: Not on file  Intimate Partner Violence: Not on file    Past Surgical Hx:  Past Surgical History:  Procedure Laterality Date  . ABDOMINAL HYSTERECTOMY  09/2011   TAHBSO with appendectomy and optimal tumor debulking  . APPENDECTOMY  2009   w/ hysterectomy  . COLONOSCOPY    . DILATION AND  CURETTAGE OF UTERUS  1996  . HAMMER TOE SURGERY Right 2017  . INGUINAL HERNIA REPAIR Right 02/08/2020   Procedure: REPAIR RIGHT INGUINAL HERNIA WITH MESH;  Surgeon: Erroll Luna, MD;  Location: [facility];  Service: General;  Laterality: Right;  GENERAL AND TAP BLOCK  . KNEE ARTHROSCOPY Right 2009   Knee  . TONSILLECTOMY  1991  . WISDOM TOOTH EXTRACTION  1985    Past Medical Hx:  Past Medical History:  Diagnosis Date  . Allergy   . Chicken pox   . Hypertension   . Ovarian cancer ([place]) 09/2011   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 9/13,  Followed by Dr Gehrig/Dr Glennon MacLaurance Flatten  . UTI (urinary tract infection)    Patient denies this dx    Family Hx:  Family History  Problem Relation Age of Onset  . Hypertension Mother   . Hypertrophic cardiomyopathy Mother 55       pt negativ rofr gene 2012-08-19  . Early death Mother   . Alzheimer's disease Father   . Cancer Brother        Squamous cell of sinus, hx smok inhalation Engineer, drilling)  . Early death Brother   . Alzheimer's disease Paternal Grandmother   . Hypertrophic cardiomyopathy Son   . Early death Son   . Heart attack Paternal Grandfather 74  . Lymphoma Paternal Uncle        late 50's/early 33's  . Miscarriages / [place] Daughter   . Heart attack Brother   . Early death Son     Vitals:  BP (!) 160/100 (BP Location: Left Arm, Patient Position: Sitting) Comment: taken manual/ NP notified  Pulse 66   Temp (!) 97 F (36.1 C)   Resp 18   Ht $R'5\' 6"'yn$  (1.676 m)   Wt 157 lb (71.2 kg)   LMP  (LMP Unknown)   SpO2 98% Comment: RA  BMI 25.34 kg/m    Physical Exam:  Physical Exam Constitutional:      General: She is not in acute distress.    Appearance: She is well-developed.  Chest:  Breasts:     Right: No supraclavicular adenopathy.     Left: No supraclavicular adenopathy.    Abdominal:     General: There is no distension.     Palpations: Abdomen is soft.     Tenderness: There is no  abdominal tenderness.  Genitourinary:    Vagina: Normal.  Lymphadenopathy:     Upper Body:     Right upper body: No supraclavicular adenopathy.     Left upper body: No supraclavicular adenopathy.     Lower Body: No right inguinal adenopathy. No left inguinal adenopathy.  Neurological:     Mental Status: She is alert and oriented to person, place, and time.      Assessment/Plan:  Ovarian cancer f/u Negative symptom review, normal exam.  No evidence of recurrence Possible OAB  >Kegel exercises  >Education materials provided >Return  prn or in 1 yr  I personally spent 25 minutes face-to-face and non-face-to-face in the care of this patient, which includes all pre, intra, and post visit time on the date of service.  Lahoma Crocker, MD 11/13/2020, 4:03 PM

## 2020-11-12 NOTE — Assessment & Plan Note (Addendum)
Negative symptom review, normal exam.  No evidence of recurrence Possible OAB  >Kegel exercises  >Education materials provided >Return prn or in 1 yr

## 2020-11-13 ENCOUNTER — Encounter: Payer: Self-pay | Admitting: Obstetrics & Gynecology

## 2020-11-13 ENCOUNTER — Other Ambulatory Visit: Payer: Self-pay

## 2020-11-13 ENCOUNTER — Inpatient Hospital Stay: Payer: Medicare HMO | Attending: Obstetrics & Gynecology | Admitting: Obstetrics & Gynecology

## 2020-11-13 ENCOUNTER — Inpatient Hospital Stay: Payer: Medicare HMO

## 2020-11-13 VITALS — BP 160/100 | HR 66 | Temp 97.0°F | Resp 18 | Ht 66.0 in | Wt 157.0 lb

## 2020-11-13 DIAGNOSIS — Z90722 Acquired absence of ovaries, bilateral: Secondary | ICD-10-CM | POA: Insufficient documentation

## 2020-11-13 DIAGNOSIS — Z8543 Personal history of malignant neoplasm of ovary: Secondary | ICD-10-CM | POA: Insufficient documentation

## 2020-11-13 DIAGNOSIS — R3915 Urgency of urination: Secondary | ICD-10-CM

## 2020-11-13 DIAGNOSIS — C569 Malignant neoplasm of unspecified ovary: Secondary | ICD-10-CM

## 2020-11-13 DIAGNOSIS — Z79899 Other long term (current) drug therapy: Secondary | ICD-10-CM | POA: Insufficient documentation

## 2020-11-13 DIAGNOSIS — I10 Essential (primary) hypertension: Secondary | ICD-10-CM | POA: Insufficient documentation

## 2020-11-13 DIAGNOSIS — Z90710 Acquired absence of both cervix and uterus: Secondary | ICD-10-CM | POA: Insufficient documentation

## 2020-11-13 LAB — URINALYSIS, COMPLETE (UACMP) WITH MICROSCOPIC
Bilirubin Urine: NEGATIVE
Glucose, UA: NEGATIVE mg/dL
Ketones, ur: NEGATIVE mg/dL
Leukocytes,Ua: NEGATIVE
Nitrite: NEGATIVE
Protein, ur: NEGATIVE mg/dL
Specific Gravity, Urine: 1.006 (ref 1.005–1.030)
pH: 5 (ref 5.0–8.0)

## 2020-11-13 NOTE — Patient Instructions (Signed)
Return in 1 yr.   Overactive Bladder, Adult  Overactive bladder is a condition in which a person has a sudden and frequent need to urinate. A person might also leak urine if he or she cannot get to the bathroom fast enough (urinary incontinence). Sometimes, symptoms can interfere with work or social activities. What are the causes? Overactive bladder is associated with poor nerve signals between your bladder and your brain. Your bladder may get the signal to empty before it is full. You may also have very sensitive muscles that make your bladder squeeze too soon. This condition may also be caused by other factors, such as:  Medical conditions: ? Urinary tract infection. ? Infection of nearby tissues. ? Prostate enlargement. ? Bladder stones, inflammation, or tumors. ? Diabetes. ? Muscle or nerve weakness, especially from these conditions:  A spinal cord injury.  Stroke.  Multiple sclerosis.  Parkinson's disease.  Other causes: ? Surgery on the uterus or urethra. ? Drinking too much caffeine or alcohol. ? Certain medicines, especially those that eliminate extra fluid in the body (diuretics). ? Constipation. What increases the risk? You may be at greater risk for overactive bladder if you:  Are an older adult.  Smoke.  Are going through menopause.  Have prostate problems.  Have a neurological disease, such as stroke, dementia, Parkinson's disease, or multiple sclerosis (MS).  Eat or drink alcohol, spicy food, caffeine, and other things that irritate the bladder.  Are overweight or obese. What are the signs or symptoms? Symptoms of this condition include a sudden, strong urge to urinate. Other symptoms include:  Leaking urine.  Urinating 8 or more times a day.  Waking up to urinate 2 or more times overnight. How is this diagnosed? This condition may be diagnosed based on:  Your symptoms and medical history.  A physical exam.  Blood or urine tests to check for  possible causes, such as infection. You may also need to see a health care provider who specializes in urinary tract problems. This is called a urologist. How is this treated? Treatment for overactive bladder depends on the cause of your condition and whether it is mild or severe. Treatment may include:  Bladder training, such as: ? Learning to control the urge to urinate by following a schedule to urinate at regular intervals. ? Doing Kegel exercises to strengthen the pelvic floor muscles that support your bladder.  Special devices, such as: ? Biofeedback. This uses sensors to help you become aware of your body's signals. ? Electrical stimulation. This uses electrodes placed inside the body (implanted) or outside the body. These electrodes send gentle pulses of electricity to strengthen the nerves or muscles that control the bladder. ? Women may use a plastic device, called a pessary, that fits into the vagina and supports the bladder.  Medicines, such as: ? Antibiotics to treat bladder infection. ? Antispasmodics to stop the bladder from releasing urine at the wrong time. ? Tricyclic antidepressants to relax bladder muscles. ? Injections of botulinum toxin type A directly into the bladder tissue to relax bladder muscles.  Surgery, such as: ? A device may be implanted to help manage the nerve signals that control urination. ? An electrode may be implanted to stimulate electrical signals in the bladder. ? A procedure may be done to change the shape of the bladder. This is done only in very severe cases. Follow these instructions at home: Eating and drinking  Make diet or lifestyle changes recommended by your health care provider.  These may include: ? Drinking fluids throughout the day and not only with meals. ? Cutting down on caffeine or alcohol. ? Eating a healthy and balanced diet to prevent constipation. This may include:  Choosing foods that are high in fiber, such as beans,  whole grains, and fresh fruits and vegetables.  Limiting foods that are high in fat and processed sugars, such as fried and sweet foods.   Lifestyle  Lose weight if needed.  Do not use any products that contain nicotine or tobacco. These include cigarettes, chewing tobacco, and vaping devices, such as e-cigarettes. If you need help quitting, ask your health care provider.   General instructions  Take over-the-counter and prescription medicines only as told by your health care provider.  If you were prescribed an antibiotic medicine, take it as told by your health care provider. Do not stop taking the antibiotic even if you start to feel better.  Use any implants or pessary as told by your health care provider.  If needed, wear pads to absorb urine leakage.  Keep a log to track how much and when you drink, and when you need to urinate. This will help your health care provider monitor your condition.  Keep all follow-up visits. This is important. Contact a health care provider if:  You have a fever or chills.  Your symptoms do not get better with treatment.  Your pain and discomfort get worse.  You have more frequent urges to urinate. Get help right away if:  You are not able to control your bladder. Summary  Overactive bladder refers to a condition in which a person has a sudden and frequent need to urinate.  Several conditions may lead to an overactive bladder.  Treatment for overactive bladder depends on the cause and severity of your condition.  Making lifestyle changes, doing Kegel exercises, keeping a log, and taking medicines can help with this condition. This information is not intended to replace advice given to you by your health care provider. Make sure you discuss any questions you have with your health care provider. Document Revised: 03/18/2020 Document Reviewed: 03/18/2020 Elsevier Patient Education  2021 [employer].   Kegel Exercises  Kegel exercises  can help strengthen your pelvic floor muscles. The pelvic floor is a group of muscles that support your rectum, small intestine, and bladder. In females, pelvic floor muscles also help support the womb (uterus). These muscles help you control the flow of urine and stool. Kegel exercises are painless and simple, and they do not require any equipment. Your provider may suggest Kegel exercises to:  Improve bladder and bowel control.  Improve sexual response.  Improve weak pelvic floor muscles after surgery to remove the uterus (hysterectomy) or pregnancy (females).  Improve weak pelvic floor muscles after prostate gland removal or surgery (males). Kegel exercises involve squeezing your pelvic floor muscles, which are the same muscles you squeeze when you try to stop the flow of urine or keep from passing gas. The exercises can be done while sitting, standing, or lying down, but it is best to vary your position. Exercises How to do Kegel exercises: 1. Squeeze your pelvic floor muscles tight. You should feel a tight lift in your rectal area. If you are a female, you should also feel a tightness in your vaginal area. Keep your stomach, buttocks, and legs relaxed. 2. Hold the muscles tight for up to 10 seconds. 3. Breathe normally. 4. Relax your muscles. 5. Repeat as told by your health care  provider. Repeat this exercise daily as told by your health care provider. Continue to do this exercise for at least 4-6 weeks, or for as long as told by your health care provider. You may be referred to a physical therapist who can help you learn more about how to do Kegel exercises. Depending on your condition, your health care provider may recommend:  Varying how long you squeeze your muscles.  Doing several sets of exercises every day.  Doing exercises for several weeks.  Making Kegel exercises a part of your regular exercise routine. This information is not intended to replace advice given to you by  your health care provider. Make sure you discuss any questions you have with your health care provider. Document Revised: 11/03/2019 Document Reviewed: 02/16/2018 Elsevier Patient Education  [place].

## 2020-11-14 ENCOUNTER — Inpatient Hospital Stay: Payer: Medicare HMO

## 2020-11-14 ENCOUNTER — Telehealth: Payer: Self-pay

## 2020-11-14 DIAGNOSIS — Z8543 Personal history of malignant neoplasm of ovary: Secondary | ICD-10-CM | POA: Diagnosis not present

## 2020-11-14 DIAGNOSIS — R3915 Urgency of urination: Secondary | ICD-10-CM

## 2020-11-14 DIAGNOSIS — C569 Malignant neoplasm of unspecified ovary: Secondary | ICD-10-CM

## 2020-11-14 NOTE — Addendum Note (Signed)
Addended by: Baruch Merl on: 11/14/2020 09:04 AM   Modules accepted: Orders

## 2020-11-14 NOTE — Telephone Encounter (Signed)
LM for Leah Perkins that a urine culture is being added to her urine specimen. An ATB is not being sent in. Will wait for the urine culture to result.  This can take up to 3 days so the result may not be back until Monday 11-18-20. Increase fluids to flush urinary tract.

## 2020-11-15 ENCOUNTER — Telehealth: Payer: Self-pay | Admitting: Oncology

## 2020-11-15 LAB — CA 125: Cancer Antigen (CA) 125: 14.6 U/mL (ref 0.0–38.1)

## 2020-11-15 NOTE — Telephone Encounter (Signed)
Leah Perkins called back and was advised of her CA 125 results and that her urine culture did come back positive.  Asked if she would want to start and antibiotic today or wait for the sensitivity to come back.  She would like to wait for the sensitivity results. Advised her that we will call her when they are back.

## 2020-11-15 NOTE — Telephone Encounter (Signed)
Left a message for Raeleen regarding her urine culture results.  Requested a return call.

## 2020-11-16 LAB — URINE CULTURE: Culture: >=100000 — AB

## 2020-11-18 ENCOUNTER — Telehealth: Payer: Self-pay | Admitting: Oncology

## 2020-11-18 MED ORDER — NITROFURANTOIN MONOHYD MACRO 100 MG PO CAPS: 100.0000 mg | ORAL_CAPSULE | Freq: Two times a day (BID) | ORAL | 0 refills | Status: AC

## 2020-11-18 NOTE — Addendum Note (Signed)
Addended by: Elmo Putt R on: 11/18/2020 12:47 PM   Modules accepted: Orders

## 2020-11-18 NOTE — Telephone Encounter (Signed)
Called Leah Perkins and advised her that we will send in a prescription for Macrobid to treat her UTI.  She is going to check which pharmacy to send it to and call us back.

## 2020-11-18 NOTE — Telephone Encounter (Signed)
Kambre called back and wants prescription sent to [facility] in [place].

## 2021-02-25 ENCOUNTER — Encounter: Payer: Medicare HMO | Admitting: Family Medicine

## 2021-03-10 ENCOUNTER — Other Ambulatory Visit: Payer: Self-pay

## 2021-03-11 ENCOUNTER — Encounter: Payer: Self-pay | Admitting: Family Medicine

## 2021-03-11 ENCOUNTER — Ambulatory Visit (INDEPENDENT_AMBULATORY_CARE_PROVIDER_SITE_OTHER): Payer: Medicare HMO | Admitting: Family Medicine

## 2021-03-11 ENCOUNTER — Other Ambulatory Visit: Payer: Self-pay

## 2021-03-11 VITALS — BP 125/84 | HR 66 | Temp 98.0°F | Ht 66.0 in | Wt 159.0 lb

## 2021-03-11 DIAGNOSIS — Z0000 Encounter for general adult medical examination without abnormal findings: Secondary | ICD-10-CM

## 2021-03-11 DIAGNOSIS — E782 Mixed hyperlipidemia: Secondary | ICD-10-CM | POA: Diagnosis not present

## 2021-03-11 DIAGNOSIS — Z23 Encounter for immunization: Secondary | ICD-10-CM | POA: Diagnosis not present

## 2021-03-11 DIAGNOSIS — I10 Essential (primary) hypertension: Secondary | ICD-10-CM | POA: Diagnosis not present

## 2021-03-11 DIAGNOSIS — Z131 Encounter for screening for diabetes mellitus: Secondary | ICD-10-CM | POA: Diagnosis not present

## 2021-03-11 DIAGNOSIS — Z1231 Encounter for screening mammogram for malignant neoplasm of breast: Secondary | ICD-10-CM

## 2021-03-11 DIAGNOSIS — E2839 Other primary ovarian failure: Secondary | ICD-10-CM

## 2021-03-11 LAB — COMPREHENSIVE METABOLIC PANEL
ALT: 19 U/L (ref 0–35)
AST: 24 U/L (ref 0–37)
Albumin: 4.1 g/dL (ref 3.5–5.2)
Alkaline Phosphatase: 51 U/L (ref 39–117)
BUN: 21 mg/dL (ref 6–23)
CO2: 28 mEq/L (ref 19–32)
Calcium: 9.6 mg/dL (ref 8.4–10.5)
Chloride: 102 mEq/L (ref 96–112)
Creatinine, Ser: 0.96 mg/dL (ref 0.40–1.20)
GFR: 61.98 mL/min (ref >60.00)
Glucose, Bld: 76 mg/dL (ref 70–99)
Potassium: 4 mEq/L (ref 3.5–5.1)
Sodium: 139 mEq/L (ref 135–145)
Total Bilirubin: 1.1 mg/dL (ref 0.2–1.2)
Total Protein: 6.7 g/dL (ref 6.0–8.3)

## 2021-03-11 LAB — LIPID PANEL
Cholesterol: 203 mg/dL — ABNORMAL HIGH (ref 0–200)
HDL: 79.9 mg/dL (ref >39.00)
LDL Cholesterol: 114 mg/dL — ABNORMAL HIGH (ref 0–99)
NonHDL: 123.02
Total CHOL/HDL Ratio: 3
Triglycerides: 43 mg/dL (ref 0.0–149.0)
VLDL: 8.6 mg/dL (ref 0.0–40.0)

## 2021-03-11 LAB — CBC WITH DIFFERENTIAL/PLATELET
Basophils Absolute: 0 10*3/uL (ref 0.0–0.1)
Basophils Relative: 1.2 % (ref 0.0–3.0)
Eosinophils Absolute: 0.2 10*3/uL (ref 0.0–0.7)
Eosinophils Relative: 4.9 % (ref 0.0–5.0)
HCT: 35.9 % — ABNORMAL LOW (ref 36.0–46.0)
Hemoglobin: 11.9 g/dL — ABNORMAL LOW (ref 12.0–15.0)
Lymphocytes Relative: 34.5 % (ref 12.0–46.0)
Lymphs Abs: 1.2 10*3/uL (ref 0.7–4.0)
MCHC: 33.2 g/dL (ref 30.0–36.0)
MCV: 87 fl (ref 78.0–100.0)
Monocytes Absolute: 0.2 10*3/uL (ref 0.1–1.0)
Monocytes Relative: 6.7 % (ref 3.0–12.0)
Neutro Abs: 1.9 10*3/uL (ref 1.4–7.7)
Neutrophils Relative %: 52.7 % (ref 43.0–77.0)
Platelets: 240 10*3/uL (ref 150.0–400.0)
RBC: 4.13 Mil/uL (ref 3.87–5.11)
RDW: 15.8 % — ABNORMAL HIGH (ref 11.5–15.5)
WBC: 3.6 10*3/uL — ABNORMAL LOW (ref 4.0–10.5)

## 2021-03-11 LAB — HEMOGLOBIN A1C: Hgb A1c MFr Bld: 5.8 % (ref 4.6–6.5)

## 2021-03-11 LAB — TSH: TSH: 1.79 u[IU]/mL (ref 0.35–5.50)

## 2021-03-11 MED ORDER — ATENOLOL 25 MG PO TABS: 12.5000 mg | ORAL_TABLET | Freq: Every day | ORAL | 1 refills | Status: DC

## 2021-03-11 NOTE — Patient Instructions (Signed)
Great to see you today.  I have refilled the medication(s) we provide.   If labs were collected, we will inform you of lab results once received either by echart message or telephone call.   - echart message- for normal results that have been seen by the patient already.   - telephone call: abnormal results or if patient has not viewed results in their echart.   Health Maintenance After Age 65 After age 65, you are at a higher risk for certain long-term diseases and infections as well as injuries from falls. Falls are a major cause of broken bones and head injuries in people who are older than age 65. Getting regular preventive care can help to keep you healthy and well. Preventive care includes getting regular testing and making lifestyle changes as recommended by your health care provider. Talk with your health care provider about: Which screenings and tests you should have. A screening is a test that checks for a disease when you have no symptoms. A diet and exercise plan that is right for you. What should I know about screenings and tests to prevent falls? Screening and testing are the best ways to find a health problem early. Early diagnosis and treatment give you the best chance of managing medical conditions that are common after age 65. Certain conditions and lifestyle choices may make you more likely to have a fall. Your health care provider may recommend: Regular vision checks. Poor vision and conditions such as cataracts can make you more likely to have a fall. If you wear glasses, make sure to get your prescription updated if your vision changes. Medicine review. Work with your health care provider to regularly review all of the medicines you are taking, including over-the-counter medicines. Ask your health care provider about any side effects that may make you more likely to have a fall. Tell your health care provider if any medicines that you take make you feel dizzy or  sleepy. Osteoporosis screening. Osteoporosis is a condition that causes the bones to get weaker. This can make the bones weak and cause them to break more easily. Blood pressure screening. Blood pressure changes and medicines to control blood pressure can make you feel dizzy. Strength and balance checks. Your health care provider may recommend certain tests to check your strength and balance while standing, walking, or changing positions. Foot health exam. Foot pain and numbness, as well as not wearing proper footwear, can make you more likely to have a fall. Depression screening. You may be more likely to have a fall if you have a fear of falling, feel emotionally low, or feel unable to do activities that you used to do. Alcohol use screening. Using too much alcohol can affect your balance and may make you more likely to have a fall. What actions can I take to lower my risk of falls? General instructions Talk with your health care provider about your risks for falling. Tell your health care provider if: You fall. Be sure to tell your health care provider about all falls, even ones that seem minor. You feel dizzy, sleepy, or off-balance. Take over-the-counter and prescription medicines only as told by your health care provider. These include any supplements. Eat a healthy diet and maintain a healthy weight. A healthy diet includes low-fat dairy products, low-fat (lean) meats, and fiber from whole grains, beans, and lots of fruits and vegetables. Home safety Remove any tripping hazards, such as rugs, cords, and clutter. Install safety equipment such as   grab bars in bathrooms and safety rails on stairs. Keep rooms and walkways well-lit. Activity  Follow a regular exercise program to stay fit. This will help you maintain your balance. Ask your health care provider what types of exercise are appropriate for you. If you need a cane or walker, use it as recommended by your health care provider. Wear  supportive shoes that have nonskid soles. Lifestyle Do not drink alcohol if your health care provider tells you not to drink. If you drink alcohol, limit how much you have: 0-1 drink a day for women. 0-2 drinks a day for men. Be aware of how much alcohol is in your drink. In the [place] one drink equals one typical bottle of beer (12 oz), one-half glass of wine (5 oz), or one shot of hard liquor (1 oz). Do not use any products that contain nicotine or tobacco, such as cigarettes and e-cigarettes. If you need help quitting, ask your health care provider. Summary Having a healthy lifestyle and getting preventive care can help to protect your health and wellness after age 65. Screening and testing are the best way to find a health problem early and help you avoid having a fall. Early diagnosis and treatment give you the best chance for managing medical conditions that are more common for people who are older than age 65. Falls are a major cause of broken bones and head injuries in people who are older than age 65. Take precautions to prevent a fall at home. Work with your health care provider to learn what changes you can make to improve your health and wellness and to prevent falls. This information is not intended to replace advice given to you by your health care provider. Make sure you discuss any questions you have with your health care provider. Document Revised: 09/06/2020 Document Reviewed: 06/14/2020 Elsevier Patient Education  2022 [employer]  

## 2021-03-11 NOTE — Progress Notes (Signed)
This visit occurred during the SARS-CoV-2 public health emergency.  Safety protocols were in place, including screening questions prior to the visit, additional usage of staff PPE, and extensive cleaning of exam room while observing appropriate contact time as indicated for disinfecting solutions.    Patient ID: Leah Perkins, female  DOB: 1956-06-09, 65 y.o.   MRN: [id number] Patient Care Team    Relationship Specialty Notifications Start End  Ma Hillock, DO PCP - General Family Medicine  02/13/19   Janie Morning, MD Attending Physician Obstetrics and Gynecology  02/13/19   Lyndal Pulley, [place] Physician Sports Medicine  02/13/19     Chief Complaint  Patient presents with   Annual Exam    Pt is fasting    Subjective: Leah Perkins is a 65 y.o.  Female  present for CPE/cmc. All past medical history, surgical history, allergies, family history, immunizations, medications and social history were updated in the electronic medical record today. All recent labs, ED visits and hospitalizations within the last year were reviewed.  Hypertension/HLD:  Pt reports compliance with atenolol 12.5 mg qd. Blood pressures ranges at home WNL. Patient denies chest pain, shortness of breath, dizziness or lower extremity edema.   Diet: low sodium Exercise: routine RF: htn, hld, overweight, fhx.    Health maintenance:  Colonoscopy: completed 2019, by Dr. Paulita Fujita, resutls "normal"  follow up 10 yrs per pt. Mammogram: completed:08/2020- [facility]> placed order for 2023 for her Cervical cancer screening: hysterectomy- for ovarian cancer>>continue to follow with gyn Immunizations: tdap 2015 UTD, Influenza -completed today (encouraged yearly), shingrix completed. covid series completed.[email or web address] completed (20/23 due 08/2021) Infectious disease screening: HIV and Hep C completed DEXA: completed 04/07/2016> ordered today Assistive device: none Oxygen [id number] Patient has a Dental  home. Hospitalizations/ED visits: reviewed  Depression screen [facility] 2/9 03/11/2021 08/14/2020 08/15/2019 02/13/2019  Decreased Interest 0 0 0 0  Down, Depressed, Hopeless 0 0 0 0  PHQ - 2 Score 0 0 0 0   No flowsheet data found.  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 9/13,  Followed by Dr Gehrig/Dr Glennon Mac- [facility] Flatten   Patellar contusion, right, initial encounter 03/21/2018   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   UTI (urinary tract infection)    Patient denies this dx   Allergies  Allergen Reactions   Lisinopril     Decreased renal fx while on- resolved when dc'd   Sulfa Antibiotics     "does not remember"   Past Surgical History:  Procedure Laterality Date   ABDOMINAL HYSTERECTOMY  09/2011   TAHBSO with appendectomy and optimal tumor debulking   APPENDECTOMY  2009   w/ hysterectomy   COLONOSCOPY     DILATION AND CURETTAGE OF UTERUS  1996   HAMMER TOE SURGERY Right 2017   INGUINAL HERNIA REPAIR Right 02/08/2020   Procedure: REPAIR RIGHT INGUINAL HERNIA WITH MESH;  Surgeon: Erroll Luna, MD;  Location: [place];  Service: General;  Laterality: Right;  GENERAL AND TAP BLOCK   KNEE ARTHROSCOPY Right 2009  Knee   TONSILLECTOMY  1991   WISDOM TOOTH EXTRACTION  1985   Family History  Problem Relation Age of Onset   Hypertension Mother    Hypertrophic cardiomyopathy Mother 88       pt negativ rofr gene 2012-08-30   Early death Mother    Alzheimer's disease Father     Cancer Brother        Squamous cell of sinus, hx smok inhalation Engineer, drilling)   Early death Brother    Alzheimer's disease Paternal Grandmother    Hypertrophic cardiomyopathy Son    Early death Son    Heart attack Paternal Grandfather 68   Lymphoma Paternal Uncle        late 50's/early 37's   Miscarriages / Stillbirths Daughter    Heart attack Brother    Early death Son    Social History   Social History Narrative   Marital status/children/pets: Married, 3 children. Has grandchildren   Education/employment: [employer] retired   Engineer, materials:      -Wears a bicycle helmet riding a bike: Yes     -smoke alarm in the home:Yes     - wears seatbelt: Yes     - Feels safe in their relationships: Yes    Allergies as of 03/11/2021       Reactions   Lisinopril    Decreased renal fx while on- resolved when dc'd   Sulfa Antibiotics    "does not remember"        Medication List        Accurate as of March 11, 2021  9:27 AM. If you have any questions, ask your nurse or doctor.          atenolol 25 MG tablet Commonly known as: TENORMIN Take 0.5 tablets (12.5 mg total) by mouth daily.   CALCIUM 600 + D PO Take 1 tablet by mouth in the morning and at bedtime.   multivitamin with minerals tablet Take 1 tablet by mouth daily.   vitamin C 500 MG tablet Commonly known as: ASCORBIC ACID Take 500 mg by mouth 2 (two) times daily.        All past medical history, surgical history, allergies, family history, immunizations andmedications were updated in the EMR today and reviewed under the history and medication portions of their EMR.     No results found for this or any previous visit (from the past 2160 hour(s)).  MM 3D SCREEN BREAST BILATERAL Result Date: 08/23/2020 FINDINGS: There are no findings suspicious for malignancy. IMPRESSION: No mammographic evidence of malignancy. A result letter of this screening mammogram will be mailed directly to the patient. RECOMMENDATION:  Screening mammogram in one year. (Code:SM-B-01Y) BI-RADS CATEGORY  1: Negative. Electronically Signed   By: Kristopher Oppenheim M.D.   On: 08/23/2020 16:08   ROS: 14 pt review of systems performed and negative (unless mentioned in an HPI)  Objective: BP 125/84   Pulse 66   Temp 98 F (36.7 C) (Oral)   Ht '5\' 6"'$  (1.676 m)   Wt 159 lb (72.1 kg)   LMP  (LMP Unknown)   SpO2 100%   BMI 25.66 kg/m  Gen: Afebrile. No acute distress. Nontoxic in appearance, well-developed, well-nourished,  very pleasant female.  HENT: AT. [place]. Bilateral TM visualized and normal in appearance, normal external auditory canal. MMM, no oral lesions, adequate dentition. Bilateral nares within normal limits. Throat without erythema, ulcerations or exudates. no Cough on exam, no hoarseness on exam. Eyes:Pupils Equal Round Reactive to light, Extraocular  movements intact,  Conjunctiva without redness, discharge or icterus. Neck/lymp/endocrine: Supple,no lymphadenopathy, no thyromegaly CV: RRR no murmur, no edema, +2/4 P posterior tibialis pulses.  Chest: CTAB, no wheeze, rhonchi or crackles. normal Respiratory effort. good Air movement. Abd: Soft. flat. NTND. BS present. no Masses palpated. No hepatosplenomegaly. No rebound tenderness or guarding. Skin: no rashes, purpura or petechiae. Warm and well-perfused. Skin intact. Neuro/Msk:  Normal gait. PERLA. EOMi. Alert. Oriented x3.  Cranial nerves II through XII intact. Muscle strength 5/5 upper/lower extremity. DTRs equal bilaterally. Psych: Normal affect, dress and demeanor. Normal speech. Normal thought content and judgment.   No results found.  Assessment/plan: Leah Perkins is a 65 y.o. female present for CPE/cmc Essential hypertension/HLD Stable.  Continue atenolol 12.5 mg qd Cbc, cmp, tsh, lipid collected today 5.5 mos.  Need for influenza vaccination - Flu Vaccine QUAD High Dose(Fluad) Diabetes mellitus screening - Hemoglobin A1c Estrogen deficiency - DG Bone  Density; Future Breast cancer screening by mammogram - MM 3D SCREEN BREAST BILATERAL; Future Routine general medical examination at a health care facility Colonoscopy: completed 2019, by Dr. Paulita Fujita, resutls "normal"  follow up 10 yrs per pt. Mammogram: completed:08/2020- [facility] placed order for 2023 for her Cervical cancer screening: hysterectomy- for ovarian cancer>>continue to follow with gyn Immunizations: tdap 2015 UTD, Influenza -completed today (encouraged yearly), shingrix completed. covid series completed.[email or web address] completed (20/23 due 08/2021) Infectious disease screening: HIV and Hep C completed DEXA: completed 04/07/2016> ordered today Patient was encouraged to exercise greater than 150 minutes a week. Patient was encouraged to choose a diet filled with fresh fruits and vegetables, and lean meats. AVS provided to patient today for education/recommendation on gender specific health and safety maintenance. Return in about 1 year (around 03/11/2022) for CPE (30 min).  Orders Placed This Encounter  Procedures   DG Bone Density   MM 3D SCREEN BREAST BILATERAL   Flu Vaccine QUAD High Dose(Fluad)   CBC with Differential/Platelet   Comprehensive metabolic panel   Hemoglobin A1c   Lipid panel   TSH   Meds ordered this encounter  Medications   atenolol (TENORMIN) 25 MG tablet    Sig: Take 0.5 tablets (12.5 mg total) by mouth daily.    Dispense:  45 tablet    Refill:  1   Referral Orders  No referral(s) requested today     Electronically signed by: Howard Pouch, [place]

## 2021-04-21 ENCOUNTER — Telehealth: Payer: Self-pay | Admitting: Family Medicine

## 2021-04-21 NOTE — Telephone Encounter (Signed)
Left message for patient to schedule Annual Wellness Visit.  Please schedule with Nurse Health Advisor Julie Greer, RN at [facility]. Please call [phone] ask for Kathy  

## 2021-07-17 ENCOUNTER — Telehealth: Payer: Self-pay | Admitting: Family Medicine

## 2021-07-17 NOTE — Telephone Encounter (Signed)
Spoke with patient to scheduled AWV she requested a call back 07/17/21  Called patient to schedule Annual Wellness Visit.  Please schedule (telephone/video call) with Nurse Health Advisor Charlott Rakes, RN at [facility]. Please call [phone] ask for [facility]

## 2021-08-13 ENCOUNTER — Ambulatory Visit: Payer: Medicare HMO

## 2021-08-15 ENCOUNTER — Other Ambulatory Visit: Payer: Self-pay | Admitting: Family Medicine

## 2021-08-20 ENCOUNTER — Ambulatory Visit (INDEPENDENT_AMBULATORY_CARE_PROVIDER_SITE_OTHER): Payer: Medicare HMO

## 2021-08-20 ENCOUNTER — Other Ambulatory Visit: Payer: Self-pay

## 2021-08-20 DIAGNOSIS — Z0000 Encounter for general adult medical examination without abnormal findings: Secondary | ICD-10-CM | POA: Diagnosis not present

## 2021-08-20 NOTE — Patient Instructions (Addendum)
Leah Perkins , Thank you for taking time to come for your Medicare Wellness Visit. I appreciate your ongoing commitment to your health goals. Please review the following plan we discussed and let me know if I can assist you in the future.   Screening recommendations/referrals: Colonoscopy: Done 12/14/17 repeat every 10 years  Mammogram: Done 08/21/20 repeat every year Bone Density: Done 04/07/16 repeat every 2 years Recommended yearly ophthalmology/optometry visit for glaucoma screening and checkup Recommended yearly dental visit for hygiene and checkup  Vaccinations: Influenza vaccine: Done 03/11/21 repeat every year  Pneumococcal vaccine: Up to date Tdap vaccine: Done 09/13/13 repeat every 10 years  Shingles vaccine: Completed 8/3, 05/16/19   Covid-19:Completed 1//11, 1/30,03/05/20   Advanced directives: Please bring a copy of your health care power of attorney and living will to the office at your convenience.  Conditions/risks identified: Stay healthy   Next appointment: Follow up in one year for your annual wellness visit    Preventive Care 65 Years and Older, Female Preventive care refers to lifestyle choices and visits with your health care provider that can promote health and wellness. What does preventive care include? A yearly physical exam. This is also called an annual well check. Dental exams once or twice a year. Routine eye exams. Ask your health care provider how often you should have your eyes checked. Personal lifestyle choices, including: Daily care of your teeth and gums. Regular physical activity. Eating a healthy diet. Avoiding tobacco and drug use. Limiting alcohol use. Practicing safe sex. Taking low-dose aspirin every day. Taking vitamin and mineral supplements as recommended by your health care provider. What happens during an annual well check? The services and screenings done by your health care provider during your annual well check will depend on your age,  overall health, lifestyle risk factors, and family history of disease. Counseling  Your health care provider may ask you questions about your: Alcohol use. Tobacco use. Drug use. Emotional well-being. Home and relationship well-being. Sexual activity. Eating habits. History of falls. Memory and ability to understand (cognition). Work and work Statistician. Reproductive health. Screening  You may have the following tests or measurements: Height, weight, and BMI. Blood pressure. Lipid and cholesterol levels. These may be checked every 5 years, or more frequently if you are over 41 years old. Skin check. Lung cancer screening. You may have this screening every year starting at age 73 if you have a 30-pack-year history of smoking and currently smoke or have quit within the past 15 years. Fecal occult blood test (FOBT) of the stool. You may have this test every year starting at age 60. Flexible sigmoidoscopy or colonoscopy. You may have a sigmoidoscopy every 5 years or a colonoscopy every 10 years starting at age 17. Hepatitis C blood test. Hepatitis B blood test. Sexually transmitted disease (STD) testing. Diabetes screening. This is done by checking your blood sugar (glucose) after you have not eaten for a while (fasting). You may have this done every 1-3 years. Bone density scan. This is done to screen for osteoporosis. You may have this done starting at age 41. Mammogram. This may be done every 1-2 years. Talk to your health care provider about how often you should have regular mammograms. Talk with your health care provider about your test results, treatment options, and if necessary, the need for more tests. Vaccines  Your health care provider may recommend certain vaccines, such as: Influenza vaccine. This is recommended every year. Tetanus, diphtheria, and acellular pertussis (Tdap, Td)  vaccine. You may need a Td booster every 10 years. Zoster vaccine. You may need this after age  59. Pneumococcal 13-valent conjugate (PCV13) vaccine. One dose is recommended after age 35. Pneumococcal polysaccharide (PPSV23) vaccine. One dose is recommended after age 18. Talk to your health care provider about which screenings and vaccines you need and how often you need them. This information is not intended to replace advice given to you by your health care provider. Make sure you discuss any questions you have with your health care provider. Document Released: 07/26/2015 Document Revised: 03/18/2016 Document Reviewed: 04/30/2015 Elsevier Interactive Patient Education  2017 [place] Prevention in the Home Falls can cause injuries. They can happen to people of all ages. There are many things you can do to make your home safe and to help prevent falls. What can I do on the outside of my home? Regularly fix the edges of walkways and driveways and fix any cracks. Remove anything that might make you trip as you walk through a door, such as a raised step or threshold. Trim any bushes or trees on the path to your home. Use bright outdoor lighting. Clear any walking paths of anything that might make someone trip, such as rocks or tools. Regularly check to see if handrails are loose or broken. Make sure that both sides of any steps have handrails. Any raised decks and porches should have guardrails on the edges. Have any leaves, snow, or ice cleared regularly. Use sand or salt on walking paths during winter. Clean up any spills in your garage right away. This includes oil or grease spills. What can I do in the bathroom? Use night lights. Install grab bars by the toilet and in the tub and shower. Do not use towel bars as grab bars. Use non-skid mats or decals in the tub or shower. If you need to sit down in the shower, use a plastic, non-slip stool. Keep the floor dry. Clean up any water that spills on the floor as soon as it happens. Remove soap buildup in the tub or shower  regularly. Attach bath mats securely with double-sided non-slip rug tape. Do not have throw rugs and other things on the floor that can make you trip. What can I do in the bedroom? Use night lights. Make sure that you have a light by your bed that is easy to reach. Do not use any sheets or blankets that are too big for your bed. They should not hang down onto the floor. Have a firm chair that has side arms. You can use this for support while you get dressed. Do not have throw rugs and other things on the floor that can make you trip. What can I do in the kitchen? Clean up any spills right away. Avoid walking on wet floors. Keep items that you use a lot in easy-to-reach places. If you need to reach something above you, use a strong step stool that has a grab bar. Keep electrical cords out of the way. Do not use floor polish or wax that makes floors slippery. If you must use wax, use non-skid floor wax. Do not have throw rugs and other things on the floor that can make you trip. What can I do with my stairs? Do not leave any items on the stairs. Make sure that there are handrails on both sides of the stairs and use them. Fix handrails that are broken or loose. Make sure that handrails are as long  as the stairways. Check any carpeting to make sure that it is firmly attached to the stairs. Fix any carpet that is loose or worn. Avoid having throw rugs at the top or bottom of the stairs. If you do have throw rugs, attach them to the floor with carpet tape. Make sure that you have a light switch at the top of the stairs and the bottom of the stairs. If you do not have them, ask someone to add them for you. What else can I do to help prevent falls? Wear shoes that: Do not have high heels. Have rubber bottoms. Are comfortable and fit you well. Are closed at the toe. Do not wear sandals. If you use a stepladder: Make sure that it is fully opened. Do not climb a closed stepladder. Make sure that  both sides of the stepladder are locked into place. Ask someone to hold it for you, if possible. Clearly mark and make sure that you can see: Any grab bars or handrails. First and last steps. Where the edge of each step is. Use tools that help you move around (mobility aids) if they are needed. These include: Canes. Walkers. Scooters. Crutches. Turn on the lights when you go into a dark area. Replace any light bulbs as soon as they burn out. Set up your furniture so you have a clear path. Avoid moving your furniture around. If any of your floors are uneven, fix them. If there are any pets around you, be aware of where they are. Review your medicines with your doctor. Some medicines can make you feel dizzy. This can increase your chance of falling. Ask your doctor what other things that you can do to help prevent falls. This information is not intended to replace advice given to you by your health care provider. Make sure you discuss any questions you have with your health care provider. Document Released: 04/25/2009 Document Revised: 12/05/2015 Document Reviewed: 08/03/2014 Elsevier Interactive Patient Education  2017 [employer].

## 2021-08-20 NOTE — Progress Notes (Signed)
Virtual Visit via Telephone Note  I connected with  Leah Perkins on 08/20/21 at 11:45 AM EST by telephone and verified that I am speaking with the correct person using two identifiers.  Medicare Annual Wellness visit completed telephonically due to Covid-19 pandemic.   Persons participating in this call: This Health Coach and this patient.   Location: Patient: home Provider: office   I discussed the limitations, risks, security and privacy concerns of performing an evaluation and management service by telephone and the availability of in person appointments. The patient expressed understanding and agreed to proceed.  Unable to perform video visit due to video visit attempted and failed and/or patient does not have video capability.   Some vital signs may be absent or patient reported.   Willette Brace, LPN   Subjective:   Leah Perkins is a 66 y.o. female who presents for an Initial Medicare Annual Wellness Visit.  Review of Systems     Cardiac Risk Factors include: advanced age (>8men, >23 women);hypertension;dyslipidemia     Objective:    There were no vitals filed for this visit. There is no height or weight on file to calculate BMI.  Advanced Directives 08/20/2021 11/12/2020 02/08/2020 12/05/2019 12/08/2018 11/15/2017 04/03/2013  Does Patient Have a Medical Advance Directive? Yes Yes Yes Yes Yes Yes Patient has advance directive, copy not in chart  Type of Advance Directive Healthcare Power of [place];Living will [place];Living will Living will;Healthcare Power of [place];Living will Healthcare Power of [place]  Does patient want to make changes to medical advance directive? - - No - Patient declined No - Patient declined - - -  Copy of [place] in Chart? No - copy requested No - copy requested No - copy requested No - copy requested No - copy  requested No - copy requested -    Current Medications (verified) Outpatient Encounter Medications as of 08/20/2021  Medication Sig   atenolol (TENORMIN) 25 MG tablet TAKE ONE-HALF (1/2) TABLET DAILY   Calcium Carb-Cholecalciferol (CALCIUM 600 + D PO) Take 1 tablet by mouth in the morning and at bedtime.    Multiple Vitamins-Minerals (MULTIVITAMIN WITH MINERALS) tablet Take 1 tablet by mouth daily.   vitamin C (ASCORBIC ACID) 500 MG tablet Take 500 mg by mouth 2 (two) times daily.   No facility-administered encounter medications on file as of 08/20/2021.    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   UTI (urinary tract infection)    Patient denies this dx   Past Surgical History:  Procedure Laterality Date   ABDOMINAL HYSTERECTOMY  09/2011   TAHBSO with appendectomy and optimal tumor debulking   APPENDECTOMY  2009   w/ hysterectomy   COLONOSCOPY     DILATION AND CURETTAGE OF UTERUS  1996   HAMMER TOE SURGERY Right 2017   INGUINAL HERNIA REPAIR Right 02/08/2020   Procedure: REPAIR RIGHT INGUINAL HERNIA WITH MESH;  Surgeon: Erroll Luna, MD;  Location: [facility];  Service: General;  Laterality: Right;  GENERAL AND TAP BLOCK   KNEE  ARTHROSCOPY Right 2009   Knee   TONSILLECTOMY  1991   WISDOM TOOTH EXTRACTION  1985   Family History  Problem Relation Age of Onset   Hypertension Mother    Hypertrophic cardiomyopathy Mother 49       pt negativ rofr gene August 12, 2012   Early death Mother    Alzheimer's disease Father    Cancer Brother        Squamous cell of sinus, hx smok inhalation Engineer, drilling)   Early death Brother    Alzheimer's disease  Paternal Grandmother    Hypertrophic cardiomyopathy Son    Early death Son    Heart attack Paternal Grandfather 75   Lymphoma Paternal Uncle        late 50's/early 38's   Miscarriages / Stillbirths Daughter    Heart attack Brother    Early death Son    Social History   Socioeconomic History   Marital status: Married    Spouse name: Not on file   Number of children: Not on file   Years of education: Not on file   Highest education level: Not on file  Occupational History   Not on file  Tobacco Use   Smoking status: Never   Smokeless tobacco: Never  Vaping Use   Vaping Use: Never used  Substance and Sexual Activity   Alcohol use: Yes    Comment: Socially ~1 / week   Drug use: No   Sexual activity: Yes    Partners: Male    Birth control/protection: Surgical    Comment: Hysterectomy  Other Topics Concern   Not on file  Social History Narrative   Marital status/children/pets: Married, 3 children. Has grandchildren   Education/employment: A.A.S., retired   Engineer, materials:      -Wears a bicycle helmet riding a bike: Yes     -smoke alarm in the home:Yes     - wears seatbelt: Yes     - Feels safe in their relationships: Yes   Social Determinants of Radio broadcast assistant Strain: Low Risk    Difficulty of Paying Living Expenses: Not hard at all  Food Insecurity: No Food Insecurity   Worried About Charity fundraiser in the Last Year: Never true   [place] in the Last Year: Never true  Transportation Needs: No Transportation Needs   Lack of Transportation (Medical): No   Lack of Transportation (Non-Medical): No  Physical Activity: Sufficiently Active   Days of Exercise per Week: 6 days   Minutes of Exercise per Session: 60 min  Stress: No Stress Concern Present   Feeling of Stress : Not at all  Social Connections: Moderately Isolated   Frequency of Communication with Friends and Family: More than three times a week   Frequency of Social Gatherings with Friends  and Family: More than three times a week   Attends Religious Services: Never   Marine scientist or Organizations: No   Attends Music therapist: Never   Marital Status: Married    Tobacco Counseling Counseling given: Not Answered   Clinical Intake:  Pre-visit preparation completed: Yes  Pain : No/denies pain     BMI - recorded: 25.68 Nutritional Status: BMI 25 -29 Overweight Nutritional Risks: None Diabetes: No  How often do you need to have someone help you when you read instructions, pamphlets, or other written materials from your doctor or pharmacy?: 1 - Never  Diabetic?No  Interpreter Needed?: No  Information entered by :: Otila Kluver  Josehua Hammar, LPN   Activities of Daily Living In your present state of health, do you have any difficulty performing the following activities: 08/20/2021  Hearing? N  Vision? N  Difficulty concentrating or making decisions? N  Walking or climbing stairs? N  Dressing or bathing? N  Doing errands, shopping? N  Preparing Food and eating ? N  Using the Toilet? N  In the past six months, have you accidently leaked urine? N  Do you have problems with loss of bowel control? N  Managing your Medications? N  Managing your Finances? N  Housekeeping or managing your Housekeeping? N  Some recent data might be hidden    Patient Care Team: Ma Hillock, DO as PCP - General (Family Medicine) Janie Morning, MD as Attending Physician (Obstetrics and Gynecology) Lyndal Pulley, DO as Consulting Physician (Sports Medicine)  Indicate any recent Medical Services you may have received from other than [facility] providers in the past year (date may be approximate).     Assessment:   This is a routine wellness examination for [place].  Hearing/Vision screen Hearing Screening - Comments:: Pt denies any hearing issues  Vision Screening - Comments:: Pt follows up with [facility] for annual eye exams   Dietary issues and exercise  activities discussed: Current Exercise Habits: Home exercise routine, Type of exercise: walking, Time (Minutes): > 60, Frequency (Times/Week): 6, Weekly Exercise (Minutes/Week): 0   Goals Addressed             This Visit's Progress    Patient Stated       Stay Healthy       Depression Screen [facility] 2/9 Scores 08/20/2021 03/11/2021 08/14/2020 08/15/2019 02/13/2019  PHQ - 2 Score 0 0 0 0 0    Fall Risk Fall Risk  08/20/2021 03/11/2021 08/14/2020  Falls in the past year? 0 0 0  Number falls in past yr: 0 0 0  Injury with Fall? 0 0 0  Risk for fall due to : Impaired vision - -  Follow up Falls prevention discussed Falls evaluation completed -    FALL RISK PREVENTION PERTAINING TO THE HOME:  Any stairs in or around the home? Yes  If so, are there any without handrails? No  Home free of loose throw rugs in walkways, pet beds, electrical cords, etc? Yes  Adequate lighting in your home to reduce risk of falls? Yes   ASSISTIVE DEVICES UTILIZED TO PREVENT FALLS:  Life alert? No  Use of a cane, walker or w/c? No  Grab bars in the bathroom? No  Shower chair or bench in shower? No  Elevated toilet seat or a handicapped toilet? No   TIMED UP AND GO:  Was the test performed? No .   Cognitive Function:     6CIT Screen 08/20/2021  What Year? 0 points  What month? 0 points  What time? 0 points  Count back from 20 0 points  Months in reverse 0 points  Repeat phrase 0 points  Total Score 0    Immunizations Immunization History  Administered Date(s) Administered   Fluad Quad(high Dose 65+) 08/14/2020, 03/11/2021   Influenza Split 04/26/2017   Influenza,inj,Quad PF,6+ Mos 05/02/2019   Influenza-Unspecified 04/27/2018   PFIZER(Purple Top)SARS-COV-2 Vaccination 07/24/2019, 08/12/2019, 03/05/2020   Pneumococcal Conjugate-13 08/14/2020   Zoster Recombinat (Shingrix) 02/13/2019, 05/16/2019    TDAP status: Up to date  Flu Vaccine status: Up to date  Pneumococcal vaccine status: Due,  Education has been provided regarding the importance of  this vaccine. Advised may receive this vaccine at local pharmacy or Health Dept. Aware to provide a copy of the vaccination record if obtained from local pharmacy or Health Dept. Verbalized acceptance and understanding.  Covid-19 vaccine status: Completed vaccines  Qualifies for Shingles Vaccine? Yes   Zostavax completed Yes   Shingrix Completed?: Yes  Screening Tests Health Maintenance  Topic Date Due   COVID-19 Vaccine (4 - Booster for Pfizer series) 04/30/2020   DEXA SCAN  Never done   Pneumonia Vaccine 27+ Years old (2 - PPSV23 if available, else PCV20) 08/14/2021   MAMMOGRAM  08/21/2022   TETANUS/TDAP  07/14/2023   COLONOSCOPY (Pts 45-2yrs Insurance coverage will need to be confirmed)  12/15/2027   INFLUENZA VACCINE  Completed   Zoster Vaccines- Shingrix  Completed   HPV VACCINES  Aged Out   Hepatitis C Screening  Discontinued    Health Maintenance  Health Maintenance Due  Topic Date Due   COVID-19 Vaccine (4 - Booster for [place] series) 04/30/2020   DEXA SCAN  Never done   Pneumonia Vaccine 31+ Years old (2 - PPSV23 if available, else PCV20) 08/14/2021    Colorectal cancer screening: Type of screening: Colonoscopy. Completed 12/14/17. Repeat every 10 years  Mammogram status: Completed 08/21/20. Repeat every year pt scheduled 08/29/21  Bone scan scheduled 08/29/21   Additional Screening:  Hepatitis C Screening:  Discontinued   Vision Screening: Recommended annual ophthalmology exams for early detection of glaucoma and other disorders of the eye. Is the patient up to date with their annual eye exam?  Yes  Who is the provider or what is the name of the office in which the patient attends annual eye exams? [facility] If pt is not established with a provider, would they like to be referred to a provider to establish care? No .   Dental Screening: Recommended annual dental exams for proper oral hygiene  Community  Resource Referral / Chronic Care Management: CRR required this visit?  No   CCM required this visit?  No      Plan:     I have personally reviewed and noted the following in the patients chart:   Medical and social history Use of alcohol, tobacco or illicit drugs  Current medications and supplements including opioid prescriptions. Patient is not currently taking opioid prescriptions. Functional ability and status Nutritional status Physical activity Advanced directives List of other physicians Hospitalizations, surgeries, and ER visits in previous 12 months Vitals Screenings to include cognitive, depression, and falls Referrals and appointments  In addition, I have reviewed and discussed with patient certain preventive protocols, quality metrics, and best practice recommendations. A written personalized care plan for preventive services as well as general preventive health recommendations were provided to patient.     Willette Brace, LPN   07/18/1094   Nurse Notes: None

## 2021-08-29 ENCOUNTER — Ambulatory Visit
Admission: RE | Admit: 2021-08-29 | Discharge: 2021-08-29 | Disposition: A | Discharge status: Home / Self Care | Payer: Medicare HMO | Source: Ambulatory Visit | Attending: Family Medicine | Admitting: Family Medicine

## 2021-08-29 DIAGNOSIS — Z1231 Encounter for screening mammogram for malignant neoplasm of breast: Secondary | ICD-10-CM

## 2021-08-29 DIAGNOSIS — E2839 Other primary ovarian failure: Secondary | ICD-10-CM

## 2021-09-03 ENCOUNTER — Other Ambulatory Visit: Payer: Self-pay | Admitting: Family Medicine

## 2021-09-03 DIAGNOSIS — N644 Mastodynia: Secondary | ICD-10-CM

## 2021-09-04 ENCOUNTER — Ambulatory Visit: Payer: Medicare HMO | Admitting: Family Medicine

## 2021-09-09 ENCOUNTER — Encounter: Payer: Self-pay | Admitting: Family Medicine

## 2021-09-09 ENCOUNTER — Ambulatory Visit (INDEPENDENT_AMBULATORY_CARE_PROVIDER_SITE_OTHER): Payer: Medicare HMO | Admitting: Family Medicine

## 2021-09-09 ENCOUNTER — Other Ambulatory Visit: Payer: Self-pay

## 2021-09-09 VITALS — BP 123/83 | HR 56 | Temp 97.6°F | Ht 66.0 in | Wt 161.0 lb

## 2021-09-09 DIAGNOSIS — Z23 Encounter for immunization: Secondary | ICD-10-CM | POA: Diagnosis not present

## 2021-09-09 DIAGNOSIS — I10 Essential (primary) hypertension: Secondary | ICD-10-CM | POA: Diagnosis not present

## 2021-09-09 DIAGNOSIS — L820 Inflamed seborrheic keratosis: Secondary | ICD-10-CM

## 2021-09-09 DIAGNOSIS — C44310 Basal cell carcinoma of skin of unspecified parts of face: Secondary | ICD-10-CM

## 2021-09-09 DIAGNOSIS — Z8249 Family history of ischemic heart disease and other diseases of the circulatory system: Secondary | ICD-10-CM | POA: Diagnosis not present

## 2021-09-09 DIAGNOSIS — E782 Mixed hyperlipidemia: Secondary | ICD-10-CM | POA: Diagnosis not present

## 2021-09-09 HISTORY — DX: Basal cell carcinoma of skin of unspecified parts of face: C44.310

## 2021-09-09 HISTORY — DX: Inflamed seborrheic keratosis: L82.0

## 2021-09-09 MED ORDER — ATENOLOL 25 MG PO TABS: 12.5000 mg | ORAL_TABLET | Freq: Every day | ORAL | 3 refills | Status: DC

## 2021-09-09 NOTE — Patient Instructions (Addendum)
°  See you end of August. For your physical.

## 2021-09-09 NOTE — Progress Notes (Signed)
This visit occurred during the SARS-CoV-2 public health emergency.  Safety protocols were in place, including screening questions prior to the visit, additional usage of staff PPE, and extensive cleaning of exam room while observing appropriate contact time as indicated for disinfecting solutions.    Patient ID: Leah Perkins, female  DOB: February 15, 1956, 66 y.o.   MRN: [id number] Patient Care Team    Relationship Specialty Notifications Start End  Ma Hillock, DO PCP - General Family Medicine  02/13/19   Janie Morning, MD Attending Physician Obstetrics and Gynecology  02/13/19   Lyndal Pulley, [place] Physician Sports Medicine  02/13/19     Chief Complaint  Patient presents with   Hypertension    cmc; pt is not fasting    Subjective: Leah Perkins is a 66 y.o.  Female  present for cmc. All past medical history, surgical history, allergies, family history, immunizations, medications and social history were updated in the electronic medical record today. All recent labs, ED visits and hospitalizations within the last year were reviewed.  Hypertension/HLD:  Pt reports compliance with atenolol 12.5 mg qd. Blood pressures ranges at home WNL. Patient denies chest pain, shortness of breath, dizziness or lower extremity edema.  Diet: low sodium Exercise: routine RF: htn, hld, overweight, fhx.    Depression screen [facility] 2/9 08/20/2021 03/11/2021 08/14/2020 08/15/2019 02/13/2019  Decreased Interest 0 0 0 0 0  Down, Depressed, Hopeless 0 0 0 0 0  PHQ - 2 Score 0 0 0 0 0   No flowsheet data found.  Immunization History  Administered Date(s) Administered   Fluad Quad(high Dose 65+) 08/14/2020, 03/11/2021   Influenza Split 04/26/2017   Influenza,inj,Quad PF,6+ Mos 05/02/2019   Influenza-Unspecified 04/27/2018   PFIZER(Purple Top)SARS-COV-2 Vaccination 07/24/2019, 08/12/2019, 03/05/2020   PNEUMOCOCCAL CONJUGATE-20 09/09/2021   Pneumococcal Conjugate-13 08/14/2020   Zoster Recombinat  (Shingrix) 02/13/2019, 05/16/2019   Past Medical History:  Diagnosis Date   Allergy    Basal cell carcinoma of face 09/09/2021   Chicken pox    Hypertension    Inflamed seborrheic keratosis 09/09/2021   Ovarian cancer ([place]) 09/2011   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 9/13,  Followed by Dr Gehrig/Dr Glennon Mac- Laurance Flatten   Patellar contusion, right, initial encounter 03/21/2018   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   UTI (urinary tract infection)    Patient denies this dx   Allergies  Allergen Reactions   Lisinopril     Decreased renal fx while on- resolved when dc'd   Sulfa Antibiotics     "does not remember"   Past Surgical History:  Procedure Laterality Date   ABDOMINAL HYSTERECTOMY  09/2011   TAHBSO with appendectomy and optimal tumor debulking   APPENDECTOMY  2009   w/ hysterectomy   COLONOSCOPY     DILATION AND CURETTAGE OF UTERUS  1996   HAMMER TOE SURGERY Right 2017   INGUINAL HERNIA REPAIR Right 02/08/2020   Procedure: REPAIR RIGHT INGUINAL HERNIA WITH MESH;  Surgeon: Erroll Luna, MD;  Location: [facility];  Service: General;  Laterality: Right;  GENERAL AND TAP BLOCK   KNEE ARTHROSCOPY Right 2009   Knee   TONSILLECTOMY  1991   WISDOM TOOTH EXTRACTION  1985   Family History  Problem Relation Age of Onset   Hypertension Mother    Hypertrophic cardiomyopathy Mother 31       pt negativ rofr gene Aug 25, 2012   Early death Mother    Alzheimer's disease Father  Cancer Brother        Squamous cell of sinus, hx smok inhalation Engineer, drilling)   Early death Brother    Alzheimer's disease Paternal Grandmother    Hypertrophic cardiomyopathy Son    Early death Son    Heart attack Paternal Grandfather 63   Lymphoma Paternal Uncle        late 50's/early 60's   Miscarriages / Stillbirths Daughter    Heart attack Brother    Early death Son    Social History   Social History Narrative   Marital status/children/pets: Married, 3 children. Has  grandchildren   Education/employment: A.A.S., retired   Engineer, materials:      -Wears a bicycle helmet riding a bike: Yes     -smoke alarm in the home:Yes     - wears seatbelt: Yes     - Feels safe in their relationships: Yes    Allergies as of 09/09/2021       Reactions   Lisinopril    Decreased renal fx while on- resolved when dc'd   Sulfa Antibiotics    "does not remember"        Medication List        Accurate as of September 09, 2021 10:10 AM. If you have any questions, ask your nurse or doctor.          atenolol 25 MG tablet Commonly known as: TENORMIN Take 0.5 tablets (12.5 mg total) by mouth daily.   CALCIUM 600 + D PO Take 1 tablet by mouth in the morning and at bedtime.   multivitamin with minerals tablet Take 1 tablet by mouth daily.   vitamin C 500 MG tablet Commonly known as: ASCORBIC ACID Take 500 mg by mouth 2 (two) times daily.        All past medical history, surgical history, allergies, family history, immunizations andmedications were updated in the EMR today and reviewed under the history and medication portions of their EMR.     No results found for this or any previous visit (from the past 2160 hour(s)).  MM 3D SCREEN BREAST BILATERAL Result Date: 08/23/2020 FINDINGS: There are no findings suspicious for malignancy. IMPRESSION: No mammographic evidence of malignancy. A result letter of this screening mammogram will be mailed directly to the patient. RECOMMENDATION: Screening mammogram in one year. (Code:SM-B-01Y) BI-RADS CATEGORY  1: Negative. Electronically Signed   By: Kristopher Oppenheim M.D.   On: 08/23/2020 16:08   ROS: 14 pt review of systems performed and negative (unless mentioned in an HPI)  Objective: BP 123/83    Pulse (!) 56    Temp 97.6 F (36.4 C) (Oral)    Ht 5\' 6"  (1.676 m)    Wt 161 lb (73 kg)    LMP  (LMP Unknown)    SpO2 100%    BMI 25.99 kg/m  Physical Exam Vitals and nursing note reviewed.  Constitutional:      General: She is  not in acute distress.    Appearance: Normal appearance. She is normal weight. She is not ill-appearing or toxic-appearing.  Eyes:     Extraocular Movements: Extraocular movements intact.     Conjunctiva/sclera: Conjunctivae normal.     Pupils: Pupils are equal, round, and reactive to light.  Cardiovascular:     Rate and Rhythm: Normal rate and regular rhythm.     Heart sounds: No murmur heard.   No gallop.  Pulmonary:     Effort: Pulmonary effort is normal.     Breath sounds: Normal  breath sounds.  Musculoskeletal:     Right lower leg: No edema.     Left lower leg: No edema.  Skin:    General: Skin is warm and dry.     Findings: No rash.  Neurological:     Mental Status: She is alert and oriented to person, place, and time. Mental status is at baseline.  Psychiatric:        Mood and Affect: Mood normal.        Behavior: Behavior normal.        Thought Content: Thought content normal.        Judgment: Judgment normal.    No results found.  Assessment/plan: Leah Perkins is a 66 y.o. female present for cmc Essential hypertension/HLD Stable.  Continue atenolol 12.5 mg qd Labs due that visit.  5.5 mos. (CPE)  Return in about 6 months (around 03/12/2022) for CPE (30 min), [facility] (30 min).  Orders Placed This Encounter  Procedures   Pneumococcal conjugate vaccine 20-valent (Prevnar 20)   Meds ordered this encounter  Medications   atenolol (TENORMIN) 25 MG tablet    Sig: Take 0.5 tablets (12.5 mg total) by mouth daily.    Dispense:  45 tablet    Refill:  3   Referral Orders  No referral(s) requested today     Electronically signed by: Howard Pouch, [place]

## 2021-09-23 ENCOUNTER — Ambulatory Visit
Admission: RE | Admit: 2021-09-23 | Discharge: 2021-09-23 | Disposition: A | Discharge status: Home / Self Care | Payer: Medicare HMO | Source: Ambulatory Visit | Attending: Family Medicine | Admitting: Family Medicine

## 2021-09-23 ENCOUNTER — Other Ambulatory Visit: Payer: Self-pay

## 2021-09-23 DIAGNOSIS — N644 Mastodynia: Secondary | ICD-10-CM

## 2021-10-13 ENCOUNTER — Inpatient Hospital Stay (HOSPITAL_COMMUNITY): Payer: Medicare HMO

## 2021-10-13 ENCOUNTER — Inpatient Hospital Stay (HOSPITAL_BASED_OUTPATIENT_CLINIC_OR_DEPARTMENT_OTHER)
Admission: EM | Admit: 2021-10-13 | Discharge: 2021-10-20 | DRG: 328 | Disposition: A | Discharge status: Home / Self Care | Payer: Medicare HMO | Attending: General Surgery | Admitting: General Surgery

## 2021-10-13 ENCOUNTER — Emergency Department (HOSPITAL_BASED_OUTPATIENT_CLINIC_OR_DEPARTMENT_OTHER): Payer: Medicare HMO

## 2021-10-13 ENCOUNTER — Encounter (HOSPITAL_BASED_OUTPATIENT_CLINIC_OR_DEPARTMENT_OTHER): Payer: Self-pay

## 2021-10-13 ENCOUNTER — Other Ambulatory Visit: Payer: Self-pay

## 2021-10-13 DIAGNOSIS — Z8543 Personal history of malignant neoplasm of ovary: Secondary | ICD-10-CM

## 2021-10-13 DIAGNOSIS — K56609 Unspecified intestinal obstruction, unspecified as to partial versus complete obstruction: Principal | ICD-10-CM | POA: Diagnosis present

## 2021-10-13 DIAGNOSIS — I10 Essential (primary) hypertension: Secondary | ICD-10-CM | POA: Diagnosis present

## 2021-10-13 DIAGNOSIS — E876 Hypokalemia: Secondary | ICD-10-CM | POA: Diagnosis not present

## 2021-10-13 DIAGNOSIS — Z807 Family history of other malignant neoplasms of lymphoid, hematopoietic and related tissues: Secondary | ICD-10-CM | POA: Diagnosis not present

## 2021-10-13 DIAGNOSIS — Z90722 Acquired absence of ovaries, bilateral: Secondary | ICD-10-CM

## 2021-10-13 DIAGNOSIS — Z90710 Acquired absence of both cervix and uterus: Secondary | ICD-10-CM | POA: Diagnosis not present

## 2021-10-13 DIAGNOSIS — Z888 Allergy status to other drugs, medicaments and biological substances status: Secondary | ICD-10-CM | POA: Diagnosis not present

## 2021-10-13 DIAGNOSIS — Z9221 Personal history of antineoplastic chemotherapy: Secondary | ICD-10-CM | POA: Diagnosis not present

## 2021-10-13 DIAGNOSIS — Z820 Family history of epilepsy and other diseases of the nervous system: Secondary | ICD-10-CM | POA: Diagnosis not present

## 2021-10-13 DIAGNOSIS — K56600 Partial intestinal obstruction, unspecified as to cause: Secondary | ICD-10-CM | POA: Diagnosis present

## 2021-10-13 DIAGNOSIS — C569 Malignant neoplasm of unspecified ovary: Secondary | ICD-10-CM | POA: Diagnosis present

## 2021-10-13 DIAGNOSIS — Z85828 Personal history of other malignant neoplasm of skin: Secondary | ICD-10-CM | POA: Diagnosis not present

## 2021-10-13 DIAGNOSIS — Z79899 Other long term (current) drug therapy: Secondary | ICD-10-CM

## 2021-10-13 DIAGNOSIS — Z882 Allergy status to sulfonamides status: Secondary | ICD-10-CM

## 2021-10-13 DIAGNOSIS — Z8249 Family history of ischemic heart disease and other diseases of the circulatory system: Secondary | ICD-10-CM

## 2021-10-13 HISTORY — DX: Unspecified intestinal obstruction, unspecified as to partial versus complete obstruction: K56.609

## 2021-10-13 LAB — HEPATIC FUNCTION PANEL
ALT: 25 U/L (ref 0–44)
AST: 34 U/L (ref 15–41)
Albumin: 4.3 g/dL (ref 3.5–5.0)
Alkaline Phosphatase: 59 U/L (ref 38–126)
Bilirubin, Direct: 0.2 mg/dL (ref 0.0–0.2)
Indirect Bilirubin: 1 mg/dL — ABNORMAL HIGH (ref 0.3–0.9)
Total Bilirubin: 1.2 mg/dL (ref 0.3–1.2)
Total Protein: 7.8 g/dL (ref 6.5–8.1)

## 2021-10-13 LAB — URINALYSIS, ROUTINE W REFLEX MICROSCOPIC
Bilirubin Urine: NEGATIVE
Glucose, UA: NEGATIVE mg/dL
Ketones, ur: 15 mg/dL — AB
Leukocytes,Ua: NEGATIVE
Nitrite: POSITIVE — AB
Protein, ur: NEGATIVE mg/dL
Specific Gravity, Urine: 1.01 (ref 1.005–1.030)
pH: 6.5 (ref 5.0–8.0)

## 2021-10-13 LAB — BASIC METABOLIC PANEL
Anion gap: 11 (ref 5–15)
BUN: 17 mg/dL (ref 8–23)
CO2: 28 mmol/L (ref 22–32)
Calcium: 10 mg/dL (ref 8.9–10.3)
Chloride: 101 mmol/L (ref 98–111)
Creatinine, Ser: 0.83 mg/dL (ref 0.44–1.00)
GFR, Estimated: >60 mL/min (ref >60)
Glucose, Bld: 125 mg/dL — ABNORMAL HIGH (ref 70–99)
Potassium: 4 mmol/L (ref 3.5–5.1)
Sodium: 140 mmol/L (ref 135–145)

## 2021-10-13 LAB — TROPONIN I (HIGH SENSITIVITY)
Troponin I (High Sensitivity): 5 ng/L (ref <18)
Troponin I (High Sensitivity): 4 ng/L (ref <18)

## 2021-10-13 LAB — CBC
HCT: 41.7 % (ref 36.0–46.0)
Hemoglobin: 13.9 g/dL (ref 12.0–15.0)
MCH: 28.6 pg (ref 26.0–34.0)
MCHC: 33.3 g/dL (ref 30.0–36.0)
MCV: 85.8 fL (ref 80.0–100.0)
Platelets: 274 10*3/uL (ref 150–400)
RBC: 4.86 MIL/uL (ref 3.87–5.11)
RDW: 14.4 % (ref 11.5–15.5)
WBC: 6.5 10*3/uL (ref 4.0–10.5)
nRBC: 0 % (ref 0.0–0.2)

## 2021-10-13 LAB — MAGNESIUM: Magnesium: 1.8 mg/dL (ref 1.7–2.4)

## 2021-10-13 LAB — URINALYSIS, MICROSCOPIC (REFLEX)

## 2021-10-13 LAB — LIPASE, BLOOD: Lipase: 30 U/L (ref 11–51)

## 2021-10-13 LAB — PHOSPHORUS: Phosphorus: 4 mg/dL (ref 2.5–4.6)

## 2021-10-13 MED ORDER — PROCHLORPERAZINE EDISYLATE 10 MG/2ML IJ SOLN
5.0000 mg | INTRAMUSCULAR | Status: DC | PRN
  Administered 2021-10-14 – 2021-10-16 (×2): 10 mg via INTRAVENOUS
  Filled 2021-10-13 (×2): qty 2

## 2021-10-13 MED ORDER — LACTATED RINGERS IV SOLN: INTRAVENOUS | Status: AC

## 2021-10-13 MED ORDER — LACTATED RINGERS IV BOLUS
1000.0000 mL | Freq: Three times a day (TID) | INTRAVENOUS | Status: AC | PRN
Start: 2021-10-13 — End: 2021-10-15

## 2021-10-13 MED ORDER — MAGIC MOUTHWASH
15.0000 mL | Freq: Four times a day (QID) | ORAL | Status: DC | PRN
Start: 2021-10-13 — End: 2021-10-20
  Filled 2021-10-13: qty 15

## 2021-10-13 MED ORDER — MENTHOL 3 MG MT LOZG
1.0000 | LOZENGE | OROMUCOSAL | Status: DC | PRN
Start: 2021-10-13 — End: 2021-10-20

## 2021-10-13 MED ORDER — PHENOL 1.4 % MT LIQD
2.0000 | OROMUCOSAL | Status: DC | PRN
Start: 2021-10-13 — End: 2021-10-20
  Administered 2021-10-16: 2 via OROMUCOSAL
  Filled 2021-10-13: qty 177

## 2021-10-13 MED ORDER — ALUM & MAG HYDROXIDE-SIMETH 200-200-20 MG/5ML PO SUSP: 30.0000 mL | Freq: Four times a day (QID) | ORAL | Status: DC | PRN

## 2021-10-13 MED ORDER — DEXTROSE 5 % IV SOLN
1000.0000 mg | Freq: Four times a day (QID) | INTRAVENOUS | Status: DC | PRN
Start: 2021-10-13 — End: 2021-10-20
  Filled 2021-10-13: qty 10

## 2021-10-13 MED ORDER — SODIUM CHLORIDE 0.9 % IV SOLN
8.0000 mg | Freq: Four times a day (QID) | INTRAVENOUS | Status: DC | PRN
Start: 2021-10-13 — End: 2021-10-20

## 2021-10-13 MED ORDER — IOHEXOL 350 MG/ML SOLN
100.0000 mL | Freq: Once | INTRAVENOUS | Status: AC | PRN
Start: 2021-10-13 — End: 2021-10-13
  Administered 2021-10-13: 100 mL via INTRAVENOUS

## 2021-10-13 MED ORDER — SIMETHICONE 40 MG/0.6ML PO SUSP
80.0000 mg | Freq: Four times a day (QID) | ORAL | Status: DC | PRN
Start: 2021-10-13 — End: 2021-10-20
  Filled 2021-10-13: qty 1.2

## 2021-10-13 MED ORDER — LIP MEDEX EX OINT
1.0000 | TOPICAL_OINTMENT | Freq: Two times a day (BID) | CUTANEOUS | Status: DC
Start: 2021-10-13 — End: 2021-10-20
  Administered 2021-10-14 – 2021-10-20 (×10): 1 via TOPICAL
  Filled 2021-10-13: qty 7

## 2021-10-13 MED ORDER — ONDANSETRON HCL 4 MG/2ML IJ SOLN
4.0000 mg | Freq: Four times a day (QID) | INTRAMUSCULAR | Status: DC | PRN
  Administered 2021-10-13 – 2021-10-18 (×8): 4 mg via INTRAVENOUS
  Filled 2021-10-13 (×8): qty 2

## 2021-10-13 MED ORDER — DIATRIZOATE MEGLUMINE & SODIUM 66-10 % PO SOLN
90.0000 mL | Freq: Once | ORAL | Status: AC
  Administered 2021-10-14: 90 mL via NASOGASTRIC
  Filled 2021-10-13: qty 90

## 2021-10-13 MED ORDER — MIDAZOLAM HCL 2 MG/2ML IJ SOLN
2.0000 mg | Freq: Once | INTRAMUSCULAR | Status: AC
Start: 2021-10-13 — End: 2021-10-13
  Administered 2021-10-13: 2 mg via INTRAVENOUS
  Filled 2021-10-13: qty 2

## 2021-10-13 MED ORDER — HYDRALAZINE HCL 20 MG/ML IJ SOLN
5.0000 mg | INTRAMUSCULAR | Status: DC | PRN
  Administered 2021-10-18: 5 mg via INTRAVENOUS
  Filled 2021-10-13: qty 1

## 2021-10-13 MED ORDER — ACETAMINOPHEN 650 MG RE SUPP
650.0000 mg | Freq: Four times a day (QID) | RECTAL | Status: DC | PRN
  Filled 2021-10-13: qty 1

## 2021-10-13 MED ORDER — SODIUM CHLORIDE 0.9 % IV BOLUS
1000.0000 mL | Freq: Once | INTRAVENOUS | Status: AC
  Administered 2021-10-13: 1000 mL via INTRAVENOUS

## 2021-10-13 MED ORDER — LACTATED RINGERS IV BOLUS
1000.0000 mL | Freq: Once | INTRAVENOUS | Status: AC
  Administered 2021-10-13: 1000 mL via INTRAVENOUS

## 2021-10-13 MED ORDER — HYDROMORPHONE HCL 1 MG/ML IJ SOLN
0.5000 mg | INTRAMUSCULAR | Status: DC | PRN
  Administered 2021-10-13 – 2021-10-15 (×6): 1 mg via INTRAVENOUS
  Filled 2021-10-13 (×6): qty 1

## 2021-10-13 NOTE — ED Provider Notes (Signed)
?[facility] EMERGENCY DEPARTMENT ?Provider Note ? ? ?CSN: [id number] ?Arrival date & time: 10/13/21  1341 ? ?  ? ?History ? ?Chief Complaint  ?Patient presents with  ? Chest Pain  ? ? ?Leah Perkins is a 66 y.o. female. ? ?Patient presents to ER chief complaint of chest pain/epigastric pain and abdominal pain.  Symptoms ongoing since this morning.  She vomited twice this morning nonbloody nonbilious.  She had 2 bowel movements today as well.  The pain had persisted all day and she presents to the ER now. ? ? ?  ? ?Home Medications ?Prior to Admission medications   ?Medication Sig Start Date End Date Taking? Authorizing Provider  ?atenolol (TENORMIN) 25 MG tablet Take 0.5 tablets (12.5 mg total) by mouth daily. 09/09/21   Howard Pouch A, DO  ?Calcium Carb-Cholecalciferol (CALCIUM 600 + D PO) Take 1 tablet by mouth in the morning and at bedtime.     [provider]  ?Multiple Vitamins-Minerals (MULTIVITAMIN WITH MINERALS) tablet Take 1 tablet by mouth daily.    [provider]  ?vitamin C (ASCORBIC ACID) 500 MG tablet Take 500 mg by mouth 2 (two) times daily.    [provider]  ?   ? ?Allergies    ?Lisinopril and Sulfa antibiotics   ? ?Review of Systems   ?Review of Systems  ?Constitutional:  Negative for fever.  ?HENT:  Negative for ear pain.   ?Eyes:  Negative for pain.  ?Respiratory:  Negative for cough.   ?Cardiovascular:  Positive for chest pain.  ?Gastrointestinal:  Positive for abdominal pain.  ?Genitourinary:  Negative for flank pain.  ?Musculoskeletal:  Negative for back pain.  ?Skin:  Negative for rash.  ?Neurological:  Negative for headaches.  ? ?Physical Exam ?Updated Vital Signs ?BP (!) 132/92 (BP Location: Left Arm)   Pulse 66   Temp 97.7 ?F (36.5 ?C) (Oral)   Resp (!) 26   Ht '5\' 6"'$  (1.676 m)   Wt 72.6 kg   LMP  (LMP Unknown)   SpO2 100%   BMI 25.82 kg/m?  ?Physical Exam ?Constitutional:   ?   General: She is not in acute distress. ?   Appearance: Normal  appearance.  ?HENT:  ?   Head: Normocephalic.  ?   Nose: Nose normal.  ?Eyes:  ?   Extraocular Movements: Extraocular movements intact.  ?Cardiovascular:  ?   Rate and Rhythm: Normal rate.  ?Pulmonary:  ?   Effort: Pulmonary effort is normal.  ?Abdominal:  ?   Comments: Epigastric and mid abdominal tenderness present along with a distended mass in the mid abdominal region that is tender.  ?Musculoskeletal:     ?   General: Normal range of motion.  ?   Cervical back: Normal range of motion.  ?Neurological:  ?   General: No focal deficit present.  ?   Mental Status: She is alert. Mental status is at baseline.  ? ? ?ED Results / Procedures / Treatments   ?Labs ?(all labs ordered are listed, but only abnormal results are displayed) ?Labs Reviewed  ?BASIC METABOLIC PANEL - Abnormal; Notable for the following components:  ?    Result Value  ? Glucose, Bld 125 (*)   ? All other components within normal limits  ?HEPATIC FUNCTION PANEL - Abnormal; Notable for the following components:  ? Indirect Bilirubin 1.0 (*)   ? All other components within normal limits  ?CBC  ?LIPASE, BLOOD  ?MAGNESIUM  ?PHOSPHORUS  ?BASIC METABOLIC PANEL  ?  PREALBUMIN  ?TROPONIN I (HIGH SENSITIVITY)  ?TROPONIN I (HIGH SENSITIVITY)  ? ? ?EKG ?EKG Interpretation ? ?Date/Time:  Monday October 13 2021 13:50:01 EDT ?Ventricular Rate:  72 ?PR Interval:  148 ?QRS Duration: 88 ?QT Interval:  406 ?QTC Calculation: 444 ?R Axis:   63 ?Text Interpretation: Normal sinus rhythm Normal ECG When compared with ECG of 08-Feb-2020 12:11, PREVIOUS ECG IS PRESENT Confirmed by Thamas Jaegers ([id number]) on 10/13/2021 5:39:03 PM ? ?Radiology ?DG Chest 2 View ? ?Result Date: 10/13/2021 ?CLINICAL DATA:  Chest discomfort upon awakening. EXAM: CHEST - 2 VIEW COMPARISON:  None. FINDINGS: Heart size is normal. Tortuous aorta suggesting a history of hypertension. The lungs are clear. The pulmonary vascularity is normal. No effusions. No abnormal bone finding. IMPRESSION: No active disease.  Tortuous aorta, as might be seen with a history of hypertension. Electronically Signed   By: Nelson Chimes M.D.   On: 10/13/2021 14:38  ? ?DG Abd Portable 1V-Small Bowel Protocol-Position Verification ? ?Result Date: 10/13/2021 ?CLINICAL DATA:  NG tube placement EXAM: PORTABLE ABDOMEN - 1 VIEW COMPARISON:  CT 10/13/2021 FINDINGS: Esophageal tube tip overlies proximal stomach, side-port in the region of GE junction. Excreted contrast within the renal collecting systems and bladder. IMPRESSION: 1. Esophageal tube side-port in the region of GE junction, consider further advancement for more optimal position Electronically Signed   By: Donavan Foil M.D.   On: 10/13/2021 20:13  ? ?CT Angio Chest/Abd/Pel for Dissection W and/or Wo Contrast ? ?Result Date: 10/13/2021 ?CLINICAL DATA:  Acute aortic syndrome (AAS) suspected. Chest pain, back pain, left upper abdominal pain EXAM: CT ANGIOGRAPHY CHEST, ABDOMEN AND PELVIS TECHNIQUE: Non-contrast CT of the chest was initially obtained. Multidetector CT imaging through the chest, abdomen and pelvis was performed using the standard protocol during bolus administration of intravenous contrast. Multiplanar reconstructed images and MIPs were obtained and reviewed to evaluate the vascular anatomy. RADIATION DOSE REDUCTION: This exam was performed according to the departmental dose-optimization program which includes automated exposure control, adjustment of the mA and/or kV according to patient size and/or use of iterative reconstruction technique. CONTRAST:  139m OMNIPAQUE IOHEXOL 350 MG/ML SOLN COMPARISON:  CT abdomen pelvis 11/22/2019 FINDINGS: CTA CHEST FINDINGS Cardiovascular: The thoracic aorta is tortuous, but is otherwise unremarkable. No intramural hematoma, aneurysm, or dissection. No significant atherosclerotic plaque. Arch vasculature demonstrates classic anatomic configuration and is widely patent proximally. No significant coronary artery calcification. Global cardiac size  within normal limits. No pericardial effusion. Central pulmonary arteries are of normal caliber. Mediastinum/Nodes: The thyroid gland is unremarkable. The esophagus is unremarkable. No pathologic thoracic adenopathy. Lungs/Pleura: Lungs are clear. No pleural effusion or pneumothorax. Musculoskeletal: The osseous structures are age-appropriate. No acute bone abnormality. No lytic or blastic bone lesion. Review of the MIP images confirms the above findings. CTA ABDOMEN AND PELVIS FINDINGS VASCULAR Aorta: Normal caliber aorta without aneurysm, dissection, vasculitis or significant stenosis. Celiac: Patent without evidence of aneurysm, dissection, vasculitis or significant stenosis. SMA: Patent without evidence of aneurysm, dissection, vasculitis or significant stenosis. Renals: Main and tiny accessory right renal arteries and single left renal arteries are widely patent. Normal vascular morphology. No aneurysm or dissection. IMA: Patent without evidence of aneurysm, dissection, vasculitis or significant stenosis. Inflow: Patent without evidence of aneurysm, dissection, vasculitis or significant stenosis. Veins: No obvious venous abnormality within the limitations of this arterial phase study. Review of the MIP images confirms the above findings. NON-VASCULAR Hepatobiliary: Tiny cyst within the left hepatic lobe. No definite solid intrahepatic mass identified. No gallstones,  gallbladder wall thickening, or biliary dilatation. Pancreas: Unremarkable Spleen: Unremarkable Adrenals/Urinary Tract: Adrenal glands are unremarkable. Kidneys are normal, without renal calculi, focal lesion, or hydronephrosis. Bladder is unremarkable. Stomach/Bowel: A small bowel obstruction is seen within the mid jejunum with a single point of transition identified and axial image # 138/5 and coronal image # 134/10. Distally, the small bowel appears decompressed. The large bowel is unremarkable. The stomach is a unremarkable. Mild ascites. No  free intraperitoneal gas fluid. Status post appendectomy. Lymphatic: No pathologic adenopathy within the abdomen and pelvis. Reproductive: Status post hysterectomy. No adnexal masses. Other: No abdominal wall hernia. Musc

## 2021-10-13 NOTE — Assessment & Plan Note (Signed)
S/p TAH b/l SOO and adjuvant chemotherapy and surgical debulking completed 03/2012 with no evidence of recurrence at last Gyn/Onc visit 11/13/20. ?

## 2021-10-13 NOTE — Consult Note (Signed)
Note: ?Portions of this report may have been transcribed using voice recognition software. Every effort was made to ensure accuracy; however, inadvertent computerized transcription errors may be present.   Any transcriptional errors that result from this process are unintentional.   ? ? ? ?  ?     ? ?Leah Perkins  ?[birth year] ?[id number] ? ?Patient Care Team: ?Ma Hillock, DO as PCP - General (Family Medicine) ?Nancy Marus, MD (Gynecologic Oncology) ?Janie Morning, MD as Attending Physician (Obstetrics and Gynecology) ?Lyndal Pulley, DO as Consulting Physician (Sports Medicine) ?Erroll Luna, MD as Consulting Physician (General Surgery) ?Lahoma Crocker, MD as Consulting Physician (Obstetrics and Gynecology) ? ?This patient is a 66 y.o.female who presents today for surgical evaluation at the request of Dr. Thamas Jaegers, [facility] ED.  ? ?Reason for visit: SBO ? ?Patient with history of ovarian cancer diagnosed in 2013 when she lived in [place].  Underwent hysterectomy/bilateral salpingo-oophorectomy/debulking surgery.  Post adjuvant chemotherapy.  Completed September 2013.  Patient moved down to [facility] and had gynecological oncology care through [facility].  Initially Dr. Alycia Rossetti.  More recently followed by DR. Bells NP.  No evidence of any recurrence at this time. ? ?Patient notes nausea vomiting abdominal pain and crampiness.  Came to emergency department.  Exam and CT scan concerning for bowel obstruction.  No obvious recurrent carcinomatosis.  No obvious perforation, pneumatosis, shock. ? ?Recommend IV fluid resuscitation. ? ?Nasogastric tube decompression. ? ?Small bowel protocol. ? ?Surgery will help follow but try to avoid operation as most likely will be a hostile abdomen. ? ?Full consult to follow ? ?Patient Active Problem List  ? Diagnosis Date Noted  ? SBO (small bowel obstruction) ([place]) 10/13/2021  ? History of ovarian cancer 10/13/2021  ? Hypertension 02/13/2019  ?  Hyperlipidemia 02/13/2019  ? Genetic testing 12/16/2017  ? Family history of lymphoma   ? Sternoclavicular joint strain 03/07/2014  ? BRCA negative 09/27/2013  ? Ovarian cancer 09/22/2012  ? Family history of cardiomyopathy 08/05/2012  ? ? ?Past Medical History:  ?Diagnosis Date  ? Allergy   ? Basal cell carcinoma of face 09/09/2021  ? Chicken pox   ? Hypertension   ? Inflamed seborrheic keratosis 09/09/2021  ? Ovarian cancer ([place]) 09/2011  ? 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 Patellar contusion, right, initial encounter 03/21/2018  ? 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 UTI (urinary tract infection)   ? Patient denies this dx  ? ? ?Past Surgical History:  ?Procedure Laterality Date  ? APPENDECTOMY  2009  ? w/ hysterectomy  ? COLONOSCOPY    ? DEBULKING  09/2011  ? [place] OF UTERUS  1996  ? HAMMER TOE SURGERY Right 2017  ? INGUINAL HERNIA REPAIR Right 02/08/2020  ? Procedure: REPAIR RIGHT INGUINAL HERNIA WITH MESH;  Surgeon: Erroll Luna, MD;  Location: [facility];  Service: General;  Laterality: Right;  GENERAL AND TAP BLOCK  ? KNEE ARTHROSCOPY Right 2009  ? Knee  ? TONSILLECTOMY  1991  ? TOTAL ABDOMINAL HYSTERECTOMY W/ BILATERAL SALPINGOOPHORECTOMY  09/2011  ? TAHBSO with appendectomy and optimal tumor debulking in [place]  ? [place] EXTRACTION  1985  ? ? ?Social History  ? ?Socioeconomic History  ? Marital status: Married  ?  Spouse name: Not on file  ? Number of children: Not on file  ? Years of education: Not on file  ?  Highest education level: Associate degree: occupational, Hotel manager, or vocational program  ?Occupational History  ? Not on file  ?Tobacco Use  ? Smoking status: Never  ? Smokeless tobacco: Never  ?Vaping Use  ? Vaping Use: Never used  ?Substance and Sexual Activity  ? Alcohol use: Yes  ?  Comment: Socially ~1 / week  ? Drug use: No  ? Sexual activity: Yes  ?  Partners: Male  ?  Birth control/protection:  Surgical  ?  Comment: Hysterectomy  ?Other Topics Concern  ? Not on file  ?Social History Narrative  ? Marital status/children/pets: Married, 3 children. Has grandchildren  ? Education/employment: [employer] retired  ? Safety:   ?   -Wears a bicycle helmet riding a bike: Yes  ?   -smoke alarm in the home:Yes  ?   - wears seatbelt: Yes  ?   - Feels safe in their relationships: Yes  ? ?Social Determinants of Health  ? ?Financial Resource Strain: Low Risk   ? Difficulty of Paying Living Expenses: Not hard at all  ?Food Insecurity: No Food Insecurity  ? Worried About Charity fundraiser in the Last Year: Never true  ? Ran Out of Food in the Last Year: Never true  ?Transportation Needs: No Transportation Needs  ? Lack of Transportation (Medical): No  ? Lack of Transportation (Non-Medical): No  ?Physical Activity: Sufficiently Active  ? Days of Exercise per Week: 5 days  ? Minutes of Exercise per Session: 70 min  ?Stress: No Stress Concern Present  ? Feeling of Stress : Only a little  ?Social Connections: Moderately Isolated  ? Frequency of Communication with Friends and Family: More than three times a week  ? Frequency of Social Gatherings with Friends and Family: More than three times a week  ? Attends Religious Services: Never  ? Active Member of Clubs or Organizations: No  ? Attends Archivist Meetings: Never  ? Marital Status: Married  ?Intimate Partner Violence: Not At Risk  ? Fear of Current or Ex-Partner: No  ? Emotionally Abused: No  ? Physically Abused: No  ? Sexually Abused: No  ? ? ?Family History  ?Problem Relation Age of Onset  ? Hypertension Mother   ? Hypertrophic cardiomyopathy Mother 44  ?     pt negativ rofr gene 08-21-12  ? Early death Mother   ? Alzheimer's disease Father   ? Cancer Brother   ?     Squamous cell of sinus, hx smok inhalation Engineer, drilling)  ? Early death Brother   ? Alzheimer's disease Paternal Grandmother   ? Hypertrophic cardiomyopathy Son   ? Early death Son   ? Heart attack  Paternal Grandfather 30  ? Lymphoma Paternal Uncle   ?     late 50's/early 55's  ? Miscarriages / [place] Daughter   ? Heart attack Brother   ? Early death Son   ? ? ?Current Facility-Administered Medications  ?Medication Dose Route Frequency Provider Last Rate Last Admin  ? acetaminophen (TYLENOL) suppository 650 mg  650 mg Rectal Q6H PRN Michael Boston, MD      ? alum & mag hydroxide-simeth (MAALOX/MYLANTA) 200-200-20 MG/5ML suspension 30 mL  30 mL Oral Q6H PRN Michael Boston, MD      ? diatrizoate meglumine-sodium (GASTROGRAFIN) 66-10 % solution 90 mL  90 mL Per NG tube Once Michael Boston, MD      ? HYDROmorphone (DILAUDID) injection 0.5-2 mg  0.5-2 mg Intravenous Q2H PRN Michael Boston, MD      ?  lactated ringers bolus 1,000 mL  1,000 mL Intravenous Q8H PRN Michael Boston, MD      ? lactated ringers bolus 1,000 mL  1,000 mL Intravenous Once Michael Boston, MD      ? lip balm (CARMEX) ointment 1 application.  1 application. Topical BID Michael Boston, MD      ? magic mouthwash  15 mL Oral QID PRN Michael Boston, MD      ? menthol-cetylpyridinium (CEPACOL) lozenge 3 mg  1 lozenge Oral PRN Michael Boston, MD      ? methocarbamol (ROBAXIN) 1,000 mg in dextrose 5 % 100 mL IVPB  1,000 mg Intravenous Q6H PRN Michael Boston, MD      ? ondansetron [facility]) injection 4 mg  4 mg Intravenous Q6H PRN Michael Boston, MD      ? Or  ? ondansetron (ZOFRAN) 8 mg in sodium chloride 0.9 % 50 mL IVPB  8 mg Intravenous Q6H PRN Michael Boston, MD      ? phenol (CHLORASEPTIC) mouth spray 2 spray  2 spray Mouth/Throat PRN Michael Boston, MD      ? prochlorperazine (COMPAZINE) injection 5-10 mg  5-10 mg Intravenous Q4H PRN Michael Boston, MD      ? simethicone (MYLICON) 40 GY/6.9SW suspension 80 mg  80 mg Oral QID PRN Michael Boston, MD      ? sodium chloride 0.9 % bolus 1,000 mL  1,000 mL Intravenous Once [place], Greggory Brandy, MD      ? ?Current Outpatient Medications  ?Medication Sig Dispense Refill  ? atenolol (TENORMIN) 25 MG tablet Take 0.5 tablets  (12.5 mg total) by mouth daily. 45 tablet 3  ? Calcium Carb-Cholecalciferol (CALCIUM 600 + D PO) Take 1 tablet by mouth in the morning and at bedtime.     ? Multiple Vitamins-Minerals (MULTIVITAMIN WITH M

## 2021-10-13 NOTE — H&P (Signed)
?History and Physical  ? ? ?Leah Perkins QBH:[id number] DOB: 12-16-1955 DOA: 10/13/2021 ? ?PCP: Howard Pouch A, DO  ?Patient coming from: Home ? ?I have personally briefly reviewed patient's old medical records in [place] ? ?Chief Complaint: Epigastric pain ? ?HPI: ?Leah Perkins is a 66 y.o. female with medical history significant for ovarian cancer (s/p TAH b/l SOO and adjuvant chemotherapy completed 03/2012 with no evidence of recurrence at last Gyn/Onc visit 11/13/20), hypertension who presented to the ED for evaluation of epigastric pain. ? ?Patient states she was in her usual state of health until morning of 10/13/2021.  She noticed that her abdomen felt bloated while in the morning.  She had several bowel movements early in the day which were formed.  She later began to develop epigastric abdominal pain, initially 5/10 in intensity which was progressive and then became severe.  She noticed some radiation of her pain to her left flank and periumbilical areas.  She has never had this type of pain in the past. ? ?She came to the ED for further evaluation.  She says while she was walking from her car into the ED she suddenly began to vomit up to 4 times.  She felt dizzy afterwards but did not lose consciousness. ? ?ED Course  Labs/Imaging on admission: I have personally reviewed following labs and imaging studies. ? ?Initial vitals showed BP 159/100, pulse 72, RR 20, temp 97.7 ?F, SPO2 100% on room air. ? ?Labs show WBC 6.5, hemoglobin 13.9, platelets 274,000, sodium 140, potassium 4.0, bicarb 28, BUN 17, creatinine 0.83, serum glucose 125, lipase 30, troponin negative x2. ? ?Urinalysis shows positive nitrites, negative leukocytes, 6-10 RBCs, 0-5 WBC/hpf, many bacteria microscopy. ? ?2 view chest x-ray negative for active disease.  Tortuous aorta noted. ? ?CTA chest/abdomen/pelvis dissection study was negative for evidence of thoracoabdominal aortic aneurysm or dissection.  Mid small bowel obstruction with  single point transition in the mid jejunum within the periumbilical region seen.  No evidence of free intraperitoneal gas or bowel ischemia. ? ?Patient was given 1 L LR, IV Dilaudid, IV Zofran.  General surgery were consulted and recommended NG tube placement, medical admission, and they will see in consultation.  The hospitalist service was consulted to admit for further evaluation and management. ? ?Review of Systems: All systems reviewed and are negative except as documented in history of present illness above. ? ? ?Past Medical History:  ?Diagnosis Date  ? Allergy   ? Basal cell carcinoma of face 09/09/2021  ? Chicken pox   ? Hypertension   ? Inflamed seborrheic keratosis 09/09/2021  ? Ovarian cancer ([place]) 09/2011  ? 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 Patellar contusion, right, initial encounter 03/21/2018  ? 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 UTI (urinary tract infection)   ? Patient denies this dx  ? ? ?Past Surgical History:  ?Procedure Laterality Date  ? APPENDECTOMY  2009  ? w/ hysterectomy  ? COLONOSCOPY    ? DEBULKING  09/2011  ? [place] OF UTERUS  1996  ? HAMMER TOE SURGERY Right 2017  ? INGUINAL HERNIA REPAIR Right 02/08/2020  ? Procedure: REPAIR RIGHT INGUINAL HERNIA WITH MESH;  Surgeon: Erroll Luna, MD;  Location: [facility];  Service: General;  Laterality: Right;  GENERAL AND TAP BLOCK  ? KNEE ARTHROSCOPY Right 2009  ? Knee  ? TONSILLECTOMY  1991  ? TOTAL ABDOMINAL  HYSTERECTOMY W/ BILATERAL SALPINGOOPHORECTOMY  09/2011  ? TAHBSO with appendectomy and optimal tumor debulking in [place]  ? [place] EXTRACTION  1985  ? ? ?Social History: ? reports that she has never smoked. She has never used smokeless tobacco. She reports current alcohol use. She reports that she does not use drugs. ? ?Allergies  ?Allergen Reactions  ? Lisinopril   ?  Decreased renal fx while on- resolved when dc'd  ? Sulfa Antibiotics   ?   "does not remember"  ? ? ?Family History  ?Problem Relation Age of Onset  ? Hypertension Mother   ? Hypertrophic cardiomyopathy Mother 80  ?     pt negativ rofr gene 2012/08/19  ? Early death Mother   ? Alzheimer's disease Father   ? Cancer Brother   ?     Squamous cell of sinus, hx smok inhalation Engineer, drilling)  ? Early death Brother   ? Alzheimer's disease Paternal Grandmother   ? Hypertrophic cardiomyopathy Son   ? Early death Son   ? Heart attack Paternal Grandfather 11  ? Lymphoma Paternal Uncle   ?     late 50's/early 60's  ? Miscarriages / [place] Daughter   ? Heart attack Brother   ? Early death Son   ? ? ? ?Prior to Admission medications   ?Medication Sig Start Date End Date Taking? Authorizing Provider  ?atenolol (TENORMIN) 25 MG tablet Take 0.5 tablets (12.5 mg total) by mouth daily. 09/09/21   Howard Pouch A, DO  ?Calcium Carb-Cholecalciferol (CALCIUM 600 + D PO) Take 1 tablet by mouth in the morning and at bedtime.     [provider]  ?Multiple Vitamins-Minerals (MULTIVITAMIN WITH MINERALS) tablet Take 1 tablet by mouth daily.    [provider]  ?vitamin C (ASCORBIC ACID) 500 MG tablet Take 500 mg by mouth 2 (two) times daily.    [provider]  ? ? ?Physical Exam: ?Vitals:  ? 10/13/21 2015 10/13/21 2045 10/13/21 2130 10/13/21 2240  ?BP: (!) 140/97 (!) 164/97 (!) 161/99 133/86  ?Pulse: 74  70 68  ?Resp: '19 14 14 18  '$ ?Temp:   98 ?F (36.7 ?C) 98.6 ?F (37 ?C)  ?TempSrc:   Oral Oral  ?SpO2: 100%  98% 92%  ?Weight:      ?Height:      ? ?Constitutional: NAD, calm, comfortable ?Eyes: PERRL, lids and conjunctivae normal ?ENMT: Mucous membranes are moist. Posterior pharynx clear of any exudate or lesions.Normal dentition.  NG tube in place, minimal output. ?Neck: normal, supple, no masses. ?Respiratory: clear to auscultation bilaterally, no wheezing, no crackles. Normal respiratory effort. No accessory muscle use.  ?Cardiovascular: Regular rate and rhythm, no murmurs / rubs /  gallops. No extremity edema. 2+ pedal pulses. ?Abdomen: Periumbilical tenderness, no masses palpated. No hepatosplenomegaly. Bowel sounds present.  ?Musculoskeletal: no clubbing / cyanosis. No joint deformity upper and lower extremities. Good ROM, no contractures. Normal muscle tone.  ?Skin: no rashes, lesions, ulcers. No induration ?Neurologic: Sensation intact. Strength 5/5 in all 4.  ?Psychiatric: Normal judgment and insight. Alert and oriented x 3. Normal mood.  ? ?EKG: Personally reviewed. Normal sinus rhythm without acute ischemic changes.  Similar to prior. ? ?Assessment/Plan ?Principal Problem: ?  SBO (small bowel obstruction) ([place]) ?Active Problems: ?  Hypertension ?  Ovarian cancer ?  History of ovarian cancer ?  ?Leah Perkins is a 66 y.o. female with medical history significant for ovarian cancer (s/p TAH b/l SOO and adjuvant chemotherapy completed 03/2012  with no evidence of recurrence at last Gyn/Onc visit 11/13/20), hypertension who is admitted with small bowel obstruction. ? ?Assessment and Plan: ?* SBO (small bowel obstruction) ([place]) ?CT imaging showing SBO with transition point within the mid jejunum in the periumbilical region.  NG tube placed in ED. ?-General surgery following ?-Keep n.p.o. ?-Continue IV fluid hydration overnight ?-Continue analgesics as needed ? ?Hypertension ?BP stable.  Holding home atenolol, using IV hydralazine as needed while NPO. ? ?History of ovarian cancer ?S/p TAH b/l SOO and adjuvant chemotherapy and surgical debulking completed 03/2012 with no evidence of recurrence at last Gyn/Onc visit 11/13/20. ? ?DVT prophylaxis: SCDs Start: 10/13/21 2336 ?Code Status: Full code, confirmed on admission ?Family Communication: Discussed with patient, she has discussed with family ?Disposition Plan: From home and likely discharge to home pending clinical progress ?Consults called: General surgery ?Severity of Illness: ?The appropriate patient status for this patient is INPATIENT. Inpatient  status is judged to be reasonable and necessary in order to provide the required intensity of service to ensure the patient's safety. The patient's presenting symptoms, physical exam findings, and initial radio

## 2021-10-13 NOTE — ED Triage Notes (Signed)
Pt arrives with reports of waking up this morning with discomfort in her chest states that she took famotidine reports pain has gotten worse and is radiating into her left upper abdomen and in sternum with some radiation into back and left shoulder. Pt had one episode of vomiting in parking lot PTA. Pt was dizzy when moving from car to ED.  ?

## 2021-10-13 NOTE — Hospital Course (Addendum)
66 y.o. female with medical history significant for ovarian cancer (s/p TAH b/l SOO and adjuvant chemotherapy completed 03/2012 with no evidence of recurrence at last Gyn/Onc visit 11/13/20), hypertension presented with epigastric pain since 4/3, feeling bloated.  Had several bowel movement orally and noted later with abdominal pain getting more severe she came to the ED.  ?In the ED underwent further labs imaging ED positive nitrite WBC 0-5, chest x-ray no active disease CT chest abdomen pelvis no aneurysm or dissection but mainly small bowel obstruction with single point transition in the mid jejunum within the periumbilical region.Patient was given IV fluids Zofran Dilaudid, general surgery consulted placed NG tube and admitted under hospitalist service.s/p   Laparoscopic LOA 4/7 and having bowel movement since/ 4/8 ?

## 2021-10-13 NOTE — Assessment & Plan Note (Signed)
BP stable.  Holding home atenolol, using IV hydralazine as needed while NPO. ?

## 2021-10-13 NOTE — ED Notes (Addendum)
RN called lab to check on hepatic and lipase. It was put in as add on and lab was not aware. They are running it now.  ?

## 2021-10-13 NOTE — ED Notes (Addendum)
NG tube advanced per radiologist recommendation. Tube marked at nare.  ?

## 2021-10-13 NOTE — Assessment & Plan Note (Signed)
CT imaging showing SBO with transition point within the mid jejunum in the periumbilical region.  NG tube placed in ED. ?-General surgery following ?-Keep n.p.o. ?-Continue IV fluid hydration overnight ?-Continue analgesics as needed ?

## 2021-10-14 ENCOUNTER — Inpatient Hospital Stay (HOSPITAL_COMMUNITY): Payer: Medicare HMO

## 2021-10-14 ENCOUNTER — Encounter (HOSPITAL_COMMUNITY): Payer: Self-pay | Admitting: Internal Medicine

## 2021-10-14 DIAGNOSIS — K56609 Unspecified intestinal obstruction, unspecified as to partial versus complete obstruction: Secondary | ICD-10-CM | POA: Diagnosis not present

## 2021-10-14 LAB — CBC
HCT: 40.1 % (ref 36.0–46.0)
Hemoglobin: 13.5 g/dL (ref 12.0–15.0)
MCH: 29.7 pg (ref 26.0–34.0)
MCHC: 33.7 g/dL (ref 30.0–36.0)
MCV: 88.1 fL (ref 80.0–100.0)
Platelets: 257 10*3/uL (ref 150–400)
RBC: 4.55 MIL/uL (ref 3.87–5.11)
RDW: 14.6 % (ref 11.5–15.5)
WBC: 5.5 10*3/uL (ref 4.0–10.5)
nRBC: 0 % (ref 0.0–0.2)

## 2021-10-14 LAB — BASIC METABOLIC PANEL
Anion gap: 5 (ref 5–15)
BUN: 17 mg/dL (ref 8–23)
CO2: 30 mmol/L (ref 22–32)
Calcium: 9.4 mg/dL (ref 8.9–10.3)
Chloride: 107 mmol/L (ref 98–111)
Creatinine, Ser: 0.96 mg/dL (ref 0.44–1.00)
GFR, Estimated: >60 mL/min (ref >60)
Glucose, Bld: 126 mg/dL — ABNORMAL HIGH (ref 70–99)
Potassium: 3.7 mmol/L (ref 3.5–5.1)
Sodium: 142 mmol/L (ref 135–145)

## 2021-10-14 LAB — HIV ANTIBODY (ROUTINE TESTING W REFLEX): HIV Screen 4th Generation wRfx: NONREACTIVE

## 2021-10-14 MED ORDER — FAMOTIDINE IN NACL 20-0.9 MG/50ML-% IV SOLN
20.0000 mg | INTRAVENOUS | Status: DC
  Administered 2021-10-14 – 2021-10-20 (×7): 20 mg via INTRAVENOUS
  Filled 2021-10-14 (×7): qty 50

## 2021-10-14 MED ORDER — ENOXAPARIN SODIUM 40 MG/0.4ML IJ SOSY
40.0000 mg | PREFILLED_SYRINGE | INTRAMUSCULAR | Status: DC
  Administered 2021-10-14 – 2021-10-16 (×3): 40 mg via SUBCUTANEOUS
  Filled 2021-10-14 (×3): qty 0.4

## 2021-10-14 NOTE — Progress Notes (Signed)
?PROGRESS NOTE ?Leah Perkins  KKX:[id number] DOB: [birth year] DOA: 10/13/2021 ?PCP: Howard Pouch A, DO  ? ?Brief Narrative/Hospital Course: ?66 y.o. female with medical history significant for ovarian cancer (s/p TAH b/l SOO and adjuvant chemotherapy completed 03/2012 with no evidence of recurrence at last Gyn/Onc visit 11/13/20), hypertension presented with epigastric pain since 4/3, feeling bloated.  Had several bowel movement orally and noted later with abdominal pain getting more severe she came to the ED.  ?In the ED underwent further labs imaging ED positive nitrite WBC 0-5, chest x-ray no active disease CT chest abdomen pelvis no aneurysm or dissection but mainly small bowel obstruction with single point transition in the mid jejunum within the periumbilical region.Patient was given IV fluids Zofran Dilaudid, general surgery consulted placed NG tube and admitted under hospitalist service.  ?  ?Subjective: ?Seen and examined this morning.  Earlier she tried to get up and walk but vomited and was having pain.  Now feels better.  Overall much improved compared to yesterday ?No flatus or BM yet. ?Overnight no fever, on room air, blood pressure 130s to 160s. ? ?Assessment and Plan: ?Principal Problem: ?  SBO (small bowel obstruction) ([place]) ?Active Problems: ?  Hypertension ?  Ovarian cancer ?  History of ovarian cancer ?SBO: seen in CT with transition point within the mid jejunum in the periumbilical region.  ?General surgery consulted continue with current conservative management with NGT decompression, n.p.o., IV fluid hydration antiemetics analgesics.  Monitor K and mag.  ? ?Hypertension: stable. Holding po atenolol, using IV hydralazine as needed while NPO. ? ?History of ovarian cancer:S/p TAH b/l SOO and adjuvant chemotherapy and surgical debulking  ?completed 03/2012 with no evidence of recurrence at last Gyn/Onc visit 11/13/20. ? ?DVT prophylaxis: SCDs Start: 10/13/21 2336 ?Code Status:   Code Status: Full  Code ?Family Communication: plan of care discussed with patient at bedside. ?Patient status is: Inpatient level of care: Med-Surg  ?Remains inpatient because: Ongoing management of SBO ?Patient currently not stable ? ?Dispo: The patient is from: Home with family ?           Anticipated disposition: Home ? ?Mobility Assessment (last 72 hours)   ? ? Mobility Assessment   ? ? [place] Name 10/13/21 2252  ?  ?  ?  ?  ? Does patient have an order for bedrest or is patient medically unstable No - Continue assessment      ? What is the highest level of mobility based on the progressive mobility assessment? Level 5 (Walks with assist in room/hall) - Balance while stepping forward/back and can walk in room with assist - Complete      ? ?  ?  ? ?  ?  ? ?Objective: ?Vitals last 24 hrs: ?Vitals:  ? 10/14/21 0133 10/14/21 0539 10/14/21 0801 10/14/21 0949  ?BP: (!) 139/94 (!) 161/97 (!) 141/98 127/88  ?Pulse: 75 69  69  ?Resp: '18 18  18  '$ ?Temp: 98.2 ?F (36.8 ?C) 98 ?F (36.7 ?C)  98.5 ?F (36.9 ?C)  ?TempSrc: Oral Oral  Oral  ?SpO2: 98% 100%  96%  ?Weight:      ?Height:      ? ?Weight change:  ? ?Physical Examination: ?General exam: AA0x3,older than stated age, weak appearing. ?HEENT:Oral mucosa moist, Ear/Nose WNL grossly, dentition normal. ?Respiratory system: bilaterally clear BS, no use of accessory muscle ?Cardiovascular system: S1 & S2 +, No JVD,. ?Gastrointestinal system: Abdomen soft,NT,ND, BS+ ?Nervous System:Alert, awake, moving extremities and grossly nonfocal ?Extremities:  LE edema none,distal peripheral pulses palpable.  ?Skin: No rashes,no icterus. ?MSK: Normal muscle bulk,tone, power ? ?Medications reviewed:  ?Scheduled Meds: ? lip balm  1 application. Topical BID  ? ?Continuous Infusions: ? lactated ringers    ? lactated ringers 100 mL/hr at 10/14/21 0759  ? methocarbamol (ROBAXIN) IV    ? ondansetron (ZOFRAN) IV    ? ? ?  ?Diet Order   ? ?       ?  Diet NPO time specified Except for: Ice Chips  Diet effective now        ?  ? ?  ?  ? ?  ?  ?Intake/Output Summary (Last 24 hours) at 10/14/2021 1022 ?Last data filed at 10/14/2021 1000 ?Gross per 24 hour  ?Intake 1001.39 ml  ?Output 351 ml  ?Net 650.39 ml  ? ?Net IO Since Admission: 650.39 mL [10/14/21 1022]  ?Wt Readings from Last 3 Encounters:  ?10/25/2021 72.6 kg  ?09/09/21 73 kg  ?03/11/21 72.1 kg  ?  ? ?Unresulted Labs (From admission, onward)  ? ?  Start     Ordered  ? 10/15/21 1191  Basic metabolic panel  Daily,   R     ?Question:  Specimen collection method  Answer:  Lab=Lab collect  ? 10/14/21 0751  ? 10/15/21 0500  Magnesium  Tomorrow morning,   R       ?Question:  Specimen collection method  Answer:  Lab=Lab collect  ? 10/14/21 0751  ? ?  ?  ? ?  ?Data Reviewed: I have personally reviewed following labs and imaging studies ?CBC: ?Recent Labs  ?Lab 10/25/21 ?1401 10/14/21 ?0353  ?WBC 6.5 5.5  ?HGB 13.9 13.5  ?HCT 41.7 40.1  ?MCV 85.8 88.1  ?PLT 274 257  ? ?Basic Metabolic Panel: ?Recent Labs  ?Lab 10-25-21 ?1401 10-25-21 ?1815 10/14/21 ?0353  ?NA 140  --  142  ?K 4.0  --  3.7  ?CL 101  --  107  ?CO2 28  --  30  ?GLUCOSE 125*  --  126*  ?BUN 17  --  17  ?CREATININE 0.83  --  0.96  ?CALCIUM 10.0  --  9.4  ?MG  --  1.8  --   ?PHOS  --  4.0  --   ? ?GFR: ?Estimated Creatinine Clearance: 58.8 mL/min (by C-G formula based on SCr of 0.96 mg/dL). ?Liver Function Tests: ?Recent Labs  ?Lab 25-Oct-2021 ?1401  ?AST 34  ?ALT 25  ?ALKPHOS 59  ?BILITOT 1.2  ?PROT 7.8  ?ALBUMIN 4.3  ? ?Radiology Studies: ?DG Chest 2 View ? ?Result Date: 10/25/2021 ?CLINICAL DATA:  Chest discomfort upon awakening. EXAM: CHEST - 2 VIEW COMPARISON:  None. FINDINGS: Heart size is normal. Tortuous aorta suggesting a history of hypertension. The lungs are clear. The pulmonary vascularity is normal. No effusions. No abnormal bone finding. IMPRESSION: No active disease. Tortuous aorta, as might be seen with a history of hypertension. Electronically Signed   By: Nelson Chimes M.D.   On: Oct 25, 2021 14:38  ? ?DG Abd Portable  1V-Small Bowel Obstruction Protocol-initial, 8 hr delay ? ?Result Date: 10/14/2021 ?CLINICAL DATA:  Small-bowel obstruction EXAM: PORTABLE ABDOMEN - 1 VIEW COMPARISON:  2021/10/25 FINDINGS: Enteric tube is no longer seen within the field of view. There are several mildly dilated loops of small bowel within the abdomen measuring up to 3.2 cm in diameter, similar to the previous study. Air and stool seen within the colon. No gross free intraperitoneal air on supine view.  Excreted contrast present within the urinary bladder. IMPRESSION: Persistent mildly dilated loops of small bowel within the abdomen measuring up to 3.2 cm in diameter, similar to the previous study. Continued radiographic follow-up recommended. Electronically Signed   By: Davina Poke D.O.   On: 10/14/2021 09:08  ? ?DG Abd Portable 1V ? ?Result Date: 10/13/2021 ?CLINICAL DATA:  NG tube placement EXAM: PORTABLE ABDOMEN - 1 VIEW COMPARISON:  10/13/2021 FINDINGS: NG tube is in the stomach. Left abdominal small bowel loops are mildly dilated. IMPRESSION: NG tube in the stomach. Electronically Signed   By: Rolm Baptise M.D.   On: 10/13/2021 23:59  ? ?DG Abd Portable 1V-Small Bowel Protocol-Position Verification ? ?Result Date: 10/13/2021 ?CLINICAL DATA:  NG tube placement EXAM: PORTABLE ABDOMEN - 1 VIEW COMPARISON:  CT 10/13/2021 FINDINGS: Esophageal tube tip overlies proximal stomach, side-port in the region of GE junction. Excreted contrast within the renal collecting systems and bladder. IMPRESSION: 1. Esophageal tube side-port in the region of GE junction, consider further advancement for more optimal position Electronically Signed   By: Donavan Foil M.D.   On: 10/13/2021 20:13  ? ?CT Angio Chest/Abd/Pel for Dissection W and/or Wo Contrast ? ?Result Date: 10/13/2021 ?CLINICAL DATA:  Acute aortic syndrome (AAS) suspected. Chest pain, back pain, left upper abdominal pain EXAM: CT ANGIOGRAPHY CHEST, ABDOMEN AND PELVIS TECHNIQUE: Non-contrast CT of the chest  was initially obtained. Multidetector CT imaging through the chest, abdomen and pelvis was performed using the standard protocol during bolus administration of intravenous contrast. Multiplanar reconstructed image

## 2021-10-14 NOTE — Progress Notes (Signed)
Patients NG tube was advanced 10cm per PA's order. ?

## 2021-10-14 NOTE — Consult Note (Signed)
? ? ? ? ?Consult Note ? ?Leah Perkins ?May 09, 1956  ?[id number].   ? ?Requesting MD: Dr. Almyra Free ?Chief Complaint/Reason for Consult: SBO ? ?HPI:  ?66 year old female with medical history significant for HTN and  ovarian cancer (s/p TAH b/l SOO and adjuvant chemotherapy completed 03/2012 with no evidence of recurrence at last Gyn/Onc visit 11/13/20) who presented to [facility] ED on 4/3 due to epigastric abdominal pain. Symptoms began on day of presentation and progressively worsened and once she arrived at ED she developed nausea and vomiting.  ?Work up in ED significant for findings of SBO on CT scan and patient admitted to the hospitalist service and general surgery asked to see. ? ?She has surgical history as above for ovarian cancer as well as appendectomy at that time and right inguinal hernia repair. She confirms she follows with gyn/onc and no evidence of recurrence. She has not had a small bowel obstruction before. Abdominal pain is persistent today and she feels bloated. She felt nauseas and had an episode of emesis this morning. She is not passing flatus. Last BM yesterday AM. ? ? ?ROS: ?Review of Systems  ?Constitutional:  Negative for chills and fever.  ?Respiratory:  Negative for cough and shortness of breath.   ?Cardiovascular:  Negative for chest pain and leg swelling.  ?Gastrointestinal:  Positive for abdominal pain, nausea and vomiting. Negative for diarrhea.  ?Genitourinary: Negative.   ? ?Family History  ?Problem Relation Age of Onset  ? Hypertension Mother   ? Hypertrophic cardiomyopathy Mother 61  ?     pt negativ rofr gene 08/21/2012  ? Early death Mother   ? Alzheimer's disease Father   ? Cancer Brother   ?     Squamous cell of sinus, hx smok inhalation Engineer, drilling)  ? Early death Brother   ? Alzheimer's disease Paternal Grandmother   ? Hypertrophic cardiomyopathy Son   ? Early death Son   ? Heart attack Paternal Grandfather 58  ? Lymphoma Paternal Uncle   ?     late 50's/early 93's  ? Miscarriages /  [place] Daughter   ? Heart attack Brother   ? Early death Son   ? ? ?Past Medical History:  ?Diagnosis Date  ? Allergy   ? Basal cell carcinoma of face 09/09/2021  ? Chicken pox   ? Hypertension   ? Inflamed seborrheic keratosis 09/09/2021  ? Ovarian cancer ([place]) 09/2011  ? 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 Patellar contusion, right, initial encounter 03/21/2018  ? 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 UTI (urinary tract infection)   ? Patient denies this dx  ? ? ?Past Surgical History:  ?Procedure Laterality Date  ? APPENDECTOMY  2009  ? w/ hysterectomy  ? COLONOSCOPY    ? DEBULKING  09/2011  ? [place] OF UTERUS  1996  ? HAMMER TOE SURGERY Right 2017  ? INGUINAL HERNIA REPAIR Right 02/08/2020  ? Procedure: REPAIR RIGHT INGUINAL HERNIA WITH MESH;  Surgeon: Erroll Luna, MD;  Location: [facility];  Service: General;  Laterality: Right;  GENERAL AND TAP BLOCK  ? KNEE ARTHROSCOPY Right 2009  ? Knee  ? TONSILLECTOMY  1991  ? TOTAL ABDOMINAL HYSTERECTOMY W/ BILATERAL SALPINGOOPHORECTOMY  09/2011  ? TAHBSO with appendectomy and optimal tumor debulking in [place]  ? [place] EXTRACTION  1985  ? ? ?Social History:  reports that she has never smoked. She has never  used smokeless tobacco. She reports current alcohol use. She reports that she does not use drugs. ? ?Allergies:  ?Allergies  ?Allergen Reactions  ? Lisinopril   ?  Decreased renal fx while on- resolved when dc'd  ? Sulfa Antibiotics   ?  "does not remember"  ? ? ?Medications Prior to Admission  ?Medication Sig Dispense Refill  ? atenolol (TENORMIN) 25 MG tablet Take 0.5 tablets (12.5 mg total) by mouth daily. 45 tablet 3  ? Calcium Carb-Cholecalciferol (CALCIUM 600 + D PO) Take 1 tablet by mouth in the morning and at bedtime.     ? Multiple Vitamins-Minerals (MULTIVITAMIN WITH MINERALS) tablet Take 1 tablet by mouth daily.    ? vitamin C (ASCORBIC ACID) 500 MG tablet Take  500 mg by mouth 2 (two) times daily.    ? ? ?Blood pressure (!) 161/97, pulse 69, temperature 98 ?F (36.7 ?C), temperature source Oral, resp. rate 18, height '5\' 6"'$  (1.676 m), weight 72.6 kg, SpO2 100 %. ?Physical Exam: ?General: pleasant, WD, female who is laying in bed in NAD ?HEENT: head is normocephalic, atraumatic.  Sclera are noninjected.  Pupils equal and round. EOMs intact.  Ears and nose without any masses or lesions.  Mouth is pink and moist ?Heart: regular, rate, and rhythm.  Normal s1,s2. No obvious murmurs, gallops, or rubs noted.  Palpable radial and pedal pulses bilaterally ?Lungs: CTAB, no wheezes, rhonchi, or rales noted.  Respiratory effort nonlabored ?Abd: soft, +BS, Mild distension. TTP in epigastrium and periumbilically without rebound or guarding. NGT in place with thin bilious output ?MSK: all 4 extremities are symmetrical with no cyanosis, clubbing, or edema. SCDs in place ?Skin: warm and dry with no masses, lesions, or rashes ?Neuro: Cranial nerves 2-12 grossly intact, sensation is normal throughout ?Psych: A&Ox3 with an appropriate affect.  ? ? ?Results for orders placed or performed during the hospital encounter of 10/13/21 (from the past 48 hour(s))  ?Basic metabolic panel     Status: Abnormal  ? Collection Time: 10/13/21  2:01 PM  ?Result Value Ref Range  ? Sodium 140 135 - 145 mmol/L  ? Potassium 4.0 3.5 - 5.1 mmol/L  ? Chloride 101 98 - 111 mmol/L  ? CO2 28 22 - 32 mmol/L  ? Glucose, Bld 125 (H) 70 - 99 mg/dL  ?  Comment: Glucose reference range applies only to samples taken after fasting for at least 8 hours.  ? BUN 17 8 - 23 mg/dL  ? Creatinine, Ser 0.83 0.44 - 1.00 mg/dL  ? Calcium 10.0 8.9 - 10.3 mg/dL  ? GFR, Estimated >60 >60 mL/min  ?  Comment: (NOTE) ?Calculated using the CKD-EPI Creatinine Equation (2021) ?  ? Anion gap 11 5 - 15  ?  Comment: Performed at [facility], [address], [place], [place] [address]  ?CBC     Status: None  ? Collection Time: 10/13/21   2:01 PM  ?Result Value Ref Range  ? WBC 6.5 4.0 - 10.5 K/uL  ? RBC 4.86 3.87 - 5.11 MIL/uL  ? Hemoglobin 13.9 12.0 - 15.0 g/dL  ? HCT 41.7 36.0 - 46.0 %  ? MCV 85.8 80.0 - 100.0 fL  ? MCH 28.6 26.0 - 34.0 pg  ? MCHC 33.3 30.0 - 36.0 g/dL  ? RDW 14.4 11.5 - 15.5 %  ? Platelets 274 150 - 400 K/uL  ? nRBC 0.0 0.0 - 0.2 %  ?  Comment: Performed at [facility], [address] [place], [place] [address]  ?Troponin I (High Sensitivity)     Status: None  ? Collection Time: 10/13/21  2:01 PM  ?Result Value Ref Range  ? Troponin I (High Sensitivity) 5 <18 ng/L  ?  Comment: (NOTE) ?Elevated high sensitivity troponin I (hsTnI) values and significant  ?changes across serial measurements may suggest ACS but many other  ?chronic and acute conditions are known to elevate hsTnI results.  ?Refer to the "Links" section for chest pain algorithms and additional  ?guidance. ?Performed at [facility], [address] ?[place], [place] [address] ?  ?Lipase, blood     Status: None  ? Collection Time: 10/13/21  2:01 PM  ?Result Value Ref Range  ? Lipase 30 11 - 51 U/L  ?  Comment: Performed at [facility] [address] [place], [place] [address]  ?Hepatic function panel     Status: Abnormal  ? Collection Time: 10/13/21  2:01 PM  ?Result Value Ref Range  ? Total Protein 7.8 6.5 - 8.1 g/dL  ? Albumin 4.3 3.5 - 5.0 g/dL  ? AST 34 15 - 41 U/L  ? ALT 25 0 - 44 U/L  ? Alkaline Phosphatase 59 38 - 126 U/L  ? Total Bilirubin 1.2 0.3 - 1.2 mg/dL  ? Bilirubin, Direct 0.2 0.0 - 0.2 mg/dL  ? Indirect Bilirubin 1.0 (H) 0.3 - 0.9 mg/dL  ?  Comment: Performed at [facility], [address], [place], [place] [address]  ?Troponin I (High Sensitivity)     Status: None  ? Collection Time: 10/13/21  6:15 PM  ?Result Value Ref Range  ? Troponin I (High Sensitivity) 4 <18 ng/L  ?  Comment: (NOTE) ?Elevated high sensitivity troponin I (hsTnI) values and significant  ?changes across serial measurements may suggest  ACS but many other  ?chronic and acute conditions are known to elevate hsTnI results.  ?Refer to the "Links" section for chest pain algorithms and additional  ?guidance. ?Performed at [facility], 2

## 2021-10-14 NOTE — TOC Progression Note (Signed)
Transition of Care (TOC) - Progression Note  ? ? ?Patient Details  ?Name: Leah Perkins ?MRN: [id number] ?Date of Birth: September 02, 1955 ? ?Transition of Care (TOC) CM/SW Contact  ?Purcell Mouton, RN ?Phone Number: ?10/14/2021, 9:26 AM ? ?Clinical Narrative:    ?Spoke with pt who plan to return home when stable. There are no HH needs at present time. ? ? ?Expected Discharge Plan: Home/Self Care ?Barriers to Discharge: No Barriers Identified ? ?Expected Discharge Plan and Services ?Expected Discharge Plan: Home/Self Care ?  ?  ?  ?Living arrangements for the past 2 months: [place] ?                ?  ?  ?  ?  ?  ?  ?  ?  ?  ?  ? ? ?Social Determinants of Health (SDOH) Interventions ?  ? ?Readmission Risk Interventions ?   ? View : No data to display.  ?  ?  ?  ? ? ?

## 2021-10-15 ENCOUNTER — Inpatient Hospital Stay (HOSPITAL_COMMUNITY): Payer: Medicare HMO

## 2021-10-15 DIAGNOSIS — K56609 Unspecified intestinal obstruction, unspecified as to partial versus complete obstruction: Secondary | ICD-10-CM | POA: Diagnosis not present

## 2021-10-15 LAB — BASIC METABOLIC PANEL
Anion gap: 5 (ref 5–15)
BUN: 16 mg/dL (ref 8–23)
CO2: 33 mmol/L — ABNORMAL HIGH (ref 22–32)
Calcium: 8.9 mg/dL (ref 8.9–10.3)
Chloride: 102 mmol/L (ref 98–111)
Creatinine, Ser: 0.82 mg/dL (ref 0.44–1.00)
GFR, Estimated: >60 mL/min (ref >60)
Glucose, Bld: 114 mg/dL — ABNORMAL HIGH (ref 70–99)
Potassium: 3.7 mmol/L (ref 3.5–5.1)
Sodium: 140 mmol/L (ref 135–145)

## 2021-10-15 LAB — MAGNESIUM: Magnesium: 1.9 mg/dL (ref 1.7–2.4)

## 2021-10-15 MED ORDER — DIATRIZOATE MEGLUMINE & SODIUM 66-10 % PO SOLN
90.0000 mL | Freq: Once | ORAL | Status: AC
  Administered 2021-10-15: 90 mL via NASOGASTRIC
  Filled 2021-10-15 (×2): qty 90

## 2021-10-15 MED ORDER — LACTATED RINGERS IV SOLN: INTRAVENOUS | Status: AC

## 2021-10-15 NOTE — Progress Notes (Signed)
? ?Progress Note ? ?   ?Subjective: ?Had emesis twice yesterday as she was moving to go to bathroom but was not very high volume. Not passing flatus. Abdominal pain is stable and did need IV pain medication overnight. No nausea so far this am ? ? ?Objective: ?Vital signs in last 24 hours: ?Temp:  [98.2 ?F (36.8 ?C)-98.8 ?F (37.1 ?C)] 98.4 ?F (36.9 ?C) (04/05 9892) ?Pulse Rate:  [73-80] 73 (04/05 0538) ?Resp:  [16-18] 18 (04/05 0538) ?BP: (152-169)/(94-98) 152/94 (04/05 0538) ?SpO2:  [99 %] 99 % (04/05 0538) ?Last BM Date : 10/13/21 ? ?Intake/Output from previous day: ?04/04 0701 - 04/05 0700 ?In: 657.1 [I.V.:577.1; NG/GT:30; IV Piggyback:50] ?Out: 550 [Emesis/NG output:550] ?Intake/Output this shift: ?No intake/output data recorded. ? ?PE: ?General: pleasant, WD, female who is laying in bed in NAD ?HEENT:  Mouth is pink and moist ?Heart: Palpable radial pulses bilaterally ?Lungs:  Respiratory effort nonlabored ?Abd: soft, TTP periumbilically and in suprapubic region without rebound or guarding. NGT with very thin clear bilious output ?MSK: all 4 extremities are symmetrical with no cyanosis, clubbing, or edema. ?Skin: warm and dry ?Psych: A&Ox3 with an appropriate affect.  ? ? ?Lab Results:  ?Recent Labs  ?  10/13/21 ?1401 10/14/21 ?0353  ?WBC 6.5 5.5  ?HGB 13.9 13.5  ?HCT 41.7 40.1  ?PLT 274 257  ? ?BMET ?Recent Labs  ?  10/14/21 ?0353 10/15/21 ?1194  ?NA 142 140  ?K 3.7 3.7  ?CL 107 102  ?CO2 30 33*  ?GLUCOSE 126* 114*  ?BUN 17 16  ?CREATININE 0.96 0.82  ?CALCIUM 9.4 8.9  ? ?PT/INR ?No results for input(s): LABPROT, INR in the last 72 hours. ?CMP  ?   ?Component Value Date/Time  ? NA 140 10/15/2021 0417  ? K 3.7 10/15/2021 0417  ? CL 102 10/15/2021 0417  ? CO2 33 (H) 10/15/2021 0417  ? GLUCOSE 114 (H) 10/15/2021 0417  ? BUN 16 10/15/2021 0417  ? CREATININE 0.82 10/15/2021 0417  ? CREATININE 0.91 11/15/2019 1032  ? CALCIUM 8.9 10/15/2021 0417  ? PROT 7.8 10/13/2021 1401  ? ALBUMIN 4.3 10/13/2021 1401  ? AST 34  10/13/2021 1401  ? ALT 25 10/13/2021 1401  ? ALKPHOS 59 10/13/2021 1401  ? BILITOT 1.2 10/13/2021 1401  ? GFRNONAA >60 10/15/2021 0417  ? GFRNONAA >60 11/15/2019 1032  ? GFRAA >60 02/08/2020 1208  ? GFRAA >60 11/15/2019 1032  ? ?Lipase  ?   ?Component Value Date/Time  ? LIPASE 30 10/13/2021 1401  ? ? ? ? ? ?Studies/Results: ?DG Chest 2 View ? ?Result Date: 10/13/2021 ?CLINICAL DATA:  Chest discomfort upon awakening. EXAM: CHEST - 2 VIEW COMPARISON:  None. FINDINGS: Heart size is normal. Tortuous aorta suggesting a history of hypertension. The lungs are clear. The pulmonary vascularity is normal. No effusions. No abnormal bone finding. IMPRESSION: No active disease. Tortuous aorta, as might be seen with a history of hypertension. Electronically Signed   By: Nelson Chimes M.D.   On: 10/13/2021 14:38  ? ?DG Abd Portable 1V ? ?Result Date: 10/15/2021 ?CLINICAL DATA:  NG tube placement EXAM: PORTABLE ABDOMEN - 1 VIEW COMPARISON:  10/15/2021 FINDINGS: Nasogastric tube tip and side port overlie the stomach. Persistent small bowel dilation with contrast remaining within small bowel. No definite contrast in the colon. No acute osseous abnormality. Surgical clips overlie the lower abdomen and pelvis. IMPRESSION: Nasogastric tube tip and side port overlie the stomach. Persistent small bowel obstruction. Electronically Signed   By: Maurine Simmering  M.D.   On: 10/15/2021 08:51  ? ?DG Abd Portable 1V-Small Bowel Obstruction Protocol-24 hr delay ? ?Result Date: 10/15/2021 ?CLINICAL DATA:  Small-bowel obstruction. 24 hour postcontrast imaging EXAM: PORTABLE ABDOMEN - 1 VIEW COMPARISON:  10/14/2021, CTA 10/13/2021 FINDINGS: Contrast opacifies several dilated loops of small bowel within the pelvis. No definite contrast extension into the colon. No free intraperitoneal gas. Numerous surgical clips are seen in keeping with pelvic lymph node dissection. IMPRESSION: Persistent mid small bowel obstruction. No extension of contrast into the colon.  Electronically Signed   By: Fidela Salisbury M.D.   On: 10/15/2021 00:44  ? ?DG Abd Portable 1V ? ?Result Date: 10/14/2021 ?CLINICAL DATA:  Nasogastric tube placement EXAM: PORTABLE ABDOMEN - 1 VIEW COMPARISON:  Same day radiograph FINDINGS: Nasogastric tube side port overlies the distal esophagus, tip near the GE junction. Mild distension of the stomach with appearance of intraluminal contrast material in the fundus. There are persistent multiple dilated loops of small bowel. There are surgical clips overlying the lower abdomen. There is retained contrast material in the bladder. IMPRESSION: Nasogastric tube side port overlies the distal esophagus, tip near the GE junction. Recommend advancement by 9.0 cm. Persistent mildly dilated loops of small bowel suggesting small bowel obstruction. Electronically Signed   By: Maurine Simmering M.D.   On: 10/14/2021 11:43  ? ?DG Abd Portable 1V-Small Bowel Obstruction Protocol-initial, 8 hr delay ? ?Result Date: 10/14/2021 ?CLINICAL DATA:  Small-bowel obstruction EXAM: PORTABLE ABDOMEN - 1 VIEW COMPARISON:  10/13/2021 FINDINGS: Enteric tube is no longer seen within the field of view. There are several mildly dilated loops of small bowel within the abdomen measuring up to 3.2 cm in diameter, similar to the previous study. Air and stool seen within the colon. No gross free intraperitoneal air on supine view. Excreted contrast present within the urinary bladder. IMPRESSION: Persistent mildly dilated loops of small bowel within the abdomen measuring up to 3.2 cm in diameter, similar to the previous study. Continued radiographic follow-up recommended. Electronically Signed   By: Davina Poke D.O.   On: 10/14/2021 09:08  ? ?DG Abd Portable 1V ? ?Result Date: 10/13/2021 ?CLINICAL DATA:  NG tube placement EXAM: PORTABLE ABDOMEN - 1 VIEW COMPARISON:  10/13/2021 FINDINGS: NG tube is in the stomach. Left abdominal small bowel loops are mildly dilated. IMPRESSION: NG tube in the stomach.  Electronically Signed   By: Rolm Baptise M.D.   On: 10/13/2021 23:59  ? ?DG Abd Portable 1V-Small Bowel Protocol-Position Verification ? ?Result Date: 10/13/2021 ?CLINICAL DATA:  NG tube placement EXAM: PORTABLE ABDOMEN - 1 VIEW COMPARISON:  CT 10/13/2021 FINDINGS: Esophageal tube tip overlies proximal stomach, side-port in the region of GE junction. Excreted contrast within the renal collecting systems and bladder. IMPRESSION: 1. Esophageal tube side-port in the region of GE junction, consider further advancement for more optimal position Electronically Signed   By: Donavan Foil M.D.   On: 10/13/2021 20:13  ? ?CT Angio Chest/Abd/Pel for Dissection W and/or Wo Contrast ? ?Result Date: 10/13/2021 ?CLINICAL DATA:  Acute aortic syndrome (AAS) suspected. Chest pain, back pain, left upper abdominal pain EXAM: CT ANGIOGRAPHY CHEST, ABDOMEN AND PELVIS TECHNIQUE: Non-contrast CT of the chest was initially obtained. Multidetector CT imaging through the chest, abdomen and pelvis was performed using the standard protocol during bolus administration of intravenous contrast. Multiplanar reconstructed images and MIPs were obtained and reviewed to evaluate the vascular anatomy. RADIATION DOSE REDUCTION: This exam was performed according to the departmental dose-optimization program which includes automated  exposure control, adjustment of the mA and/or kV according to patient size and/or use of iterative reconstruction technique. CONTRAST:  15m OMNIPAQUE IOHEXOL 350 MG/ML SOLN COMPARISON:  CT abdomen pelvis 11/22/2019 FINDINGS: CTA CHEST FINDINGS Cardiovascular: The thoracic aorta is tortuous, but is otherwise unremarkable. No intramural hematoma, aneurysm, or dissection. No significant atherosclerotic plaque. Arch vasculature demonstrates classic anatomic configuration and is widely patent proximally. No significant coronary artery calcification. Global cardiac size within normal limits. No pericardial effusion. Central pulmonary  arteries are of normal caliber. Mediastinum/Nodes: The thyroid gland is unremarkable. The esophagus is unremarkable. No pathologic thoracic adenopathy. Lungs/Pleura: Lungs are clear. No pleural effusion or pneumothorax

## 2021-10-15 NOTE — Progress Notes (Signed)
?PROGRESS NOTE ?Leah Perkins  IHW:[id number] DOB: Dec 17, 1955 DOA: 10/13/2021 ?PCP: Howard Pouch A, DO  ? ?Brief Narrative/Hospital Course: ?66 y.o. female with medical history significant for ovarian cancer (s/p TAH b/l SOO and adjuvant chemotherapy completed 03/2012 with no evidence of recurrence at last Gyn/Onc visit 11/13/20), hypertension presented with epigastric pain since 4/3, feeling bloated.  Had several bowel movement orally and noted later with abdominal pain getting more severe she came to the ED.  ?In the ED underwent further labs imaging ED positive nitrite WBC 0-5, chest x-ray no active disease CT chest abdomen pelvis no aneurysm or dissection but mainly small bowel obstruction with single point transition in the mid jejunum within the periumbilical region.Patient was given IV fluids Zofran Dilaudid, general surgery consulted placed NG tube and admitted under hospitalist service.  ?  ?Subjective: ?Seen and examined.  Resting comfortably.  Overnight had pain.  Needing pain medication ?Overnight, no nausea.  Afebrile,BP stable  ?NG output 550, unmeasured emesisx3 yesterday.  No flatus or BM yet ?Husband at the bedside. ? ?Assessment and Plan: ?Principal Problem: ?  SBO (small bowel obstruction) ([place]) ?Active Problems: ?  Hypertension ?  Ovarian cancer ?  History of ovarian cancer ? ?SBO: CT showes transition point within the mid jejunum in the periumbilical region.  ?General surgery following closely, 8-hour follow-through xray with persistent mild small bowel obstruction  ?Cont on conservative management with NGT decompression( it was advanced), n.p.o., IV fluid hydration antiemetics analgesics as per CCS.Monitor K and mag.  Continue plan of care as per general surgery team ? ?Hypertension: stable. Holding po atenolol, using IV hydralazine as needed while NPO. ? ?History of ovarian cancer:S/p TAH b/l SOO and adjuvant chemotherapy and surgical debulking  ?completed 03/2012 with no evidence of recurrence at  last Gyn/Onc visit 11/13/20. ? ?DVT prophylaxis: enoxaparin (LOVENOX) injection 40 mg Start: 10/14/21 1200 ?SCDs Start: 10/13/21 2336 ?Code Status:   Code Status: Full Code ?Family Communication: plan of care discussed with patient at bedside. ?Patient status is: Inpatient level of care: Med-Surg  ?Remains inpatient because: Ongoing management of SBO ?Patient currently not stable ? ?Dispo: The patient is from: Home with family ?           Anticipated disposition: Home ? ?Mobility Assessment (last 72 hours)   ? ? Mobility Assessment   ? ? [place] Name 10/14/21 1940 10/13/21 2252  ?  ?  ?  ? Does patient have an order for bedrest or is patient medically unstable No - Continue assessment No - Continue assessment     ? What is the highest level of mobility based on the progressive mobility assessment? Level 5 (Walks with assist in room/hall) - Balance while stepping forward/back and can walk in room with assist - Complete Level 5 (Walks with assist in room/hall) - Balance while stepping forward/back and can walk in room with assist - Complete     ? ?  ?  ? ?  ?  ? ?Objective: ?Vitals last 24 hrs: ?Vitals:  ? 10/14/21 0949 10/14/21 1252 10/14/21 2123 10/15/21 0538  ?BP: 127/88 (!) 169/98 (!) 156/97 (!) 152/94  ?Pulse: 69 80 77 73  ?Resp: '18 16 18 18  '$ ?Temp: 98.5 ?F (36.9 ?C) 98.2 ?F (36.8 ?C) 98.8 ?F (37.1 ?C) 98.4 ?F (36.9 ?C)  ?TempSrc: Oral Oral Oral Oral  ?SpO2: 96% 99% 99% 99%  ?Weight:      ?Height:      ? ?Weight change:  ? ?Physical Examination: ?General exam:  AA0X3,older than stated age, weak appearing. ?HEENT:Oral mucosa moist, Ear/Nose WNL grossly, dentition normal. ?Respiratory system: bilaterally diminished,no use of accessory muscle ?Cardiovascular system: S1 & S2 +, No JVD,. ?Gastrointestinal system: Abdomen soft, ngt+ NT,ND, BS sluggish ?Nervous System:Alert, awake, moving extremities and grossly nonfocal ?Extremities: edema neg,distal peripheral pulses palpable.  ?Skin: No rashes,no icterus. ?MSK: Normal  muscle bulk,tone, power ? ? ?Medications reviewed:  ?Scheduled Meds: ? diatrizoate meglumine-sodium  90 mL Per NG tube Once  ? enoxaparin (LOVENOX) injection  40 mg Subcutaneous Q24H  ? lip balm  1 application. Topical BID  ? ?Continuous Infusions: ? famotidine (PEPCID) IV 20 mg (10/14/21 1031)  ? lactated ringers    ? lactated ringers 100 mL/hr at 10/15/21 0532  ? methocarbamol (ROBAXIN) IV    ? ondansetron (ZOFRAN) IV    ? ? ?  ?Diet Order   ? ?       ?  Diet NPO time specified Except for: Ice Chips  Diet effective now       ?  ? ?  ?  ? ?  ?  ?Intake/Output Summary (Last 24 hours) at 10/15/2021 1111 ?Last data filed at 10/15/2021 0600 ?Gross per 24 hour  ?Intake 321.27 ml  ?Output 200 ml  ?Net 121.27 ml  ? ?Net IO Since Admission: 771.66 mL [10/15/21 1111]  ?Wt Readings from Last 3 Encounters:  ?11-04-21 72.6 kg  ?09/09/21 73 kg  ?03/11/21 72.1 kg  ?  ? ?Unresulted Labs (From admission, onward)  ? ?  Start     Ordered  ? 10/15/21 8115  Basic metabolic panel  Daily,   R     ?Question:  Specimen collection method  Answer:  Lab=Lab collect  ? 10/14/21 0751  ? ?  ?  ? ?  ?Data Reviewed: I have personally reviewed following labs and imaging studies ?CBC: ?Recent Labs  ?Lab 11/04/21 ?1401 10/14/21 ?0353  ?WBC 6.5 5.5  ?HGB 13.9 13.5  ?HCT 41.7 40.1  ?MCV 85.8 88.1  ?PLT 274 257  ? ?Basic Metabolic Panel: ?Recent Labs  ?Lab Nov 04, 2021 ?1401 04-Nov-2021 ?1815 10/14/21 ?0353 10/15/21 ?7262  ?NA 140  --  142 140  ?K 4.0  --  3.7 3.7  ?CL 101  --  107 102  ?CO2 28  --  30 33*  ?GLUCOSE 125*  --  126* 114*  ?BUN 17  --  17 16  ?CREATININE 0.83  --  0.96 0.82  ?CALCIUM 10.0  --  9.4 8.9  ?MG  --  1.8  --  1.9  ?PHOS  --  4.0  --   --   ? ?GFR: ?Estimated Creatinine Clearance: 68.8 mL/min (by C-G formula based on SCr of 0.82 mg/dL). ?Liver Function Tests: ?Recent Labs  ?Lab 11/04/21 ?1401  ?AST 34  ?ALT 25  ?ALKPHOS 59  ?BILITOT 1.2  ?PROT 7.8  ?ALBUMIN 4.3  ? ?Radiology Studies: ?DG Chest 2 View ? ?Result Date: 04-Nov-2021 ?CLINICAL  DATA:  Chest discomfort upon awakening. EXAM: CHEST - 2 VIEW COMPARISON:  None. FINDINGS: Heart size is normal. Tortuous aorta suggesting a history of hypertension. The lungs are clear. The pulmonary vascularity is normal. No effusions. No abnormal bone finding. IMPRESSION: No active disease. Tortuous aorta, as might be seen with a history of hypertension. Electronically Signed   By: Nelson Chimes M.D.   On: 11/04/21 14:38  ? ?DG Abd Portable 1V ? ?Result Date: 10/15/2021 ?CLINICAL DATA:  NG tube placement EXAM: PORTABLE ABDOMEN - 1 VIEW  COMPARISON:  10/15/2021 FINDINGS: Nasogastric tube tip and side port overlie the stomach. Persistent small bowel dilation with contrast remaining within small bowel. No definite contrast in the colon. No acute osseous abnormality. Surgical clips overlie the lower abdomen and pelvis. IMPRESSION: Nasogastric tube tip and side port overlie the stomach. Persistent small bowel obstruction. Electronically Signed   By: Maurine Simmering M.D.   On: 10/15/2021 08:51  ? ?DG Abd Portable 1V-Small Bowel Obstruction Protocol-24 hr delay ? ?Result Date: 10/15/2021 ?CLINICAL DATA:  Small-bowel obstruction. 24 hour postcontrast imaging EXAM: PORTABLE ABDOMEN - 1 VIEW COMPARISON:  10/14/2021, CTA 10/13/2021 FINDINGS: Contrast opacifies several dilated loops of small bowel within the pelvis. No definite contrast extension into the colon. No free intraperitoneal gas. Numerous surgical clips are seen in keeping with pelvic lymph node dissection. IMPRESSION: Persistent mid small bowel obstruction. No extension of contrast into the colon. Electronically Signed   By: Fidela Salisbury M.D.   On: 10/15/2021 00:44  ? ?DG Abd Portable 1V ? ?Result Date: 10/14/2021 ?CLINICAL DATA:  Nasogastric tube placement EXAM: PORTABLE ABDOMEN - 1 VIEW COMPARISON:  Same day radiograph FINDINGS: Nasogastric tube side port overlies the distal esophagus, tip near the GE junction. Mild distension of the stomach with appearance of  intraluminal contrast material in the fundus. There are persistent multiple dilated loops of small bowel. There are surgical clips overlying the lower abdomen. There is retained contrast material in the bladder. IMPRES

## 2021-10-16 ENCOUNTER — Inpatient Hospital Stay (HOSPITAL_COMMUNITY): Payer: Medicare HMO

## 2021-10-16 DIAGNOSIS — K56609 Unspecified intestinal obstruction, unspecified as to partial versus complete obstruction: Secondary | ICD-10-CM | POA: Diagnosis not present

## 2021-10-16 LAB — PHOSPHORUS: Phosphorus: 4 mg/dL (ref 2.5–4.6)

## 2021-10-16 LAB — BASIC METABOLIC PANEL
Anion gap: 8 (ref 5–15)
BUN: 19 mg/dL (ref 8–23)
CO2: 30 mmol/L (ref 22–32)
Calcium: 8.9 mg/dL (ref 8.9–10.3)
Chloride: 102 mmol/L (ref 98–111)
Creatinine, Ser: 0.79 mg/dL (ref 0.44–1.00)
GFR, Estimated: >60 mL/min (ref >60)
Glucose, Bld: 100 mg/dL — ABNORMAL HIGH (ref 70–99)
Potassium: 3.2 mmol/L — ABNORMAL LOW (ref 3.5–5.1)
Sodium: 140 mmol/L (ref 135–145)

## 2021-10-16 LAB — MAGNESIUM: Magnesium: 2 mg/dL (ref 1.7–2.4)

## 2021-10-16 MED ORDER — POTASSIUM CHLORIDE IN NACL 20-0.9 MEQ/L-% IV SOLN
INTRAVENOUS | Status: DC
  Filled 2021-10-16 (×7): qty 1000

## 2021-10-16 MED ORDER — POTASSIUM CHLORIDE 10 MEQ/100ML IV SOLN
10.0000 meq | INTRAVENOUS | Status: AC
  Administered 2021-10-16 (×2): 10 meq via INTRAVENOUS
  Filled 2021-10-16 (×2): qty 100

## 2021-10-16 NOTE — Progress Notes (Signed)
?PROGRESS NOTE ?SUELLA Perkins  HTD:[id number] DOB: Oct 11, 1955 DOA: 10/13/2021 ?PCP: Howard Pouch A, DO  ? ?Brief Narrative/Hospital Course: ?66 y.o. female with medical history significant for ovarian cancer (s/p TAH b/l SOO and adjuvant chemotherapy completed 03/2012 with no evidence of recurrence at last Gyn/Onc visit 11/13/20), hypertension presented with epigastric pain since 4/3, feeling bloated.  Had several bowel movement orally and noted later with abdominal pain getting more severe she came to the ED.  ?In the ED underwent further labs imaging ED positive nitrite WBC 0-5, chest x-ray no active disease CT chest abdomen pelvis no aneurysm or dissection but mainly small bowel obstruction with single point transition in the mid jejunum within the periumbilical region.Patient was given IV fluids Zofran Dilaudid, general surgery consulted placed NG tube and admitted under hospitalist service.  ? ?Subjective: ?Seen and examined this morning.  Resting. ?No flatus or BM. ?Somewhat anxious and tearful today ?Overnight afebrile Labs with potassium 3.2 ?NG ouput 1050. ? ?Assessment and Plan: ?Principal Problem: ?  SBO (small bowel obstruction) ([place]) ?Active Problems: ?  Hypertension ?  Ovarian cancer ?  History of ovarian cancer ? ?SBO:CT showes transition point within the mid jejunum in the periumbilical region.  ?General surgery following closely, still no progress on, x-ray pending ?Further plan from general surgery, and cont w/ NGT decompression, n.p.o., IV fluid hydration, pain management/antiemetics analgesics.Monitor K and mag.  ? ?Hypokalemia we will replete with IV Kcl 20+ in ivf. ?Hypertension: BP fairly controlled.Holding po atenolol, using IV hydralazine as needed while NPO. ?History of ovarian cancer:S/p TAH b/l SOO and adjuvant chemotherapy and surgical debulking  ?completed 03/2012 with no evidence of recurrence at last Gyn/Onc visit 11/13/20. ? ?DVT prophylaxis: enoxaparin (LOVENOX) injection 40 mg Start:  10/14/21 1200 ?SCDs Start: 10/13/21 2336 ?Code Status:   Code Status: Full Code ?Family Communication: plan of care discussed with patient at bedside. ?Patient status is: Inpatient level of care: Med-Surg  ?Remains inpatient because: Ongoing management of SBO ?Patient currently not stable ? ?Dispo: The patient is from: Home with family ?           Anticipated disposition: Home ? ?Mobility Assessment (last 72 hours)   ? ? Mobility Assessment   ? ? [place] Name 10/15/21 2129 10/15/21 1156 10/14/21 1940 10/13/21 2252  ?  ? Does patient have an order for bedrest or is patient medically unstable No - Continue assessment No - Continue assessment No - Continue assessment No - Continue assessment   ? What is the highest level of mobility based on the progressive mobility assessment? Level 5 (Walks with assist in room/hall) - Balance while stepping forward/back and can walk in room with assist - Complete Level 5 (Walks with assist in room/hall) - Balance while stepping forward/back and can walk in room with assist - Complete Level 5 (Walks with assist in room/hall) - Balance while stepping forward/back and can walk in room with assist - Complete Level 5 (Walks with assist in room/hall) - Balance while stepping forward/back and can walk in room with assist - Complete   ? ?  ?  ? ?  ?  ? ?Objective: ?Vitals last 24 hrs: ?Vitals:  ? 10/15/21 1309 10/15/21 2204 10/16/21 0500 10/16/21 0520  ?BP: (!) 158/95 (!) 155/95  (!) 146/99  ?Pulse: 77 77  78  ?Resp: '16 18  18  '$ ?Temp: 98.4 ?F (36.9 ?C) 99.2 ?F (37.3 ?C)  98.3 ?F (36.8 ?C)  ?TempSrc:  Oral  Oral  ?SpO2: 99% 98%  95%  ?Weight:   74.8 kg   ?Height:      ? ?Weight change:  ? ?Physical Examination: ?General exam: AA0x3, pleasant, tearful older than stated age, weak appearing. ?HEENT:Oral mucosa moist, Ear/Nose WNL grossly, dentition normal. ?Respiratory system: bilaterally diminished,no use of accessory muscle ?Cardiovascular system: S1 & S2 +, No JVD,. ?Gastrointestinal system:  Abdomen soft, mildly distended with generalized mild tenderness,BS absent ?Nervous System:Alert, awake, moving extremities and grossly nonfocal ?Extremities: edema neg,distal peripheral pulses palpable.  ?Skin: No rashes,no icterus. ?MSK: Normal muscle bulk,tone, power ? ?Medications reviewed:  ?Scheduled Meds: ? enoxaparin (LOVENOX) injection  40 mg Subcutaneous Q24H  ? lip balm  1 application. Topical BID  ? ?Continuous Infusions: ? famotidine (PEPCID) IV 20 mg (10/15/21 1141)  ? methocarbamol (ROBAXIN) IV    ? ondansetron (ZOFRAN) IV    ? ? ?  ?Diet Order   ? ?       ?  Diet NPO time specified Except for: Ice Chips  Diet effective now       ?  ? ?  ?  ? ?  ?  ?Intake/Output Summary (Last 24 hours) at 10/16/2021 0756 ?Last data filed at 10/16/2021 0556 ?Gross per 24 hour  ?Intake 411.97 ml  ?Output 1550 ml  ?Net -1138.03 ml  ? ? ?Net IO Since Admission: -366.37 mL [10/16/21 0756]  ?Wt Readings from Last 3 Encounters:  ?10/16/21 74.8 kg  ?09/09/21 73 kg  ?03/11/21 72.1 kg  ?  ? ?Unresulted Labs (From admission, onward)  ? ?  Start     Ordered  ? 10/15/21 2536  Basic metabolic panel  Daily,   R     ?Question:  Specimen collection method  Answer:  Lab=Lab collect  ? 10/14/21 0751  ? ?  ?  ? ?  ?Data Reviewed: I have personally reviewed following labs and imaging studies ?CBC: ?Recent Labs  ?Lab 10-25-21 ?1401 10/14/21 ?0353  ?WBC 6.5 5.5  ?HGB 13.9 13.5  ?HCT 41.7 40.1  ?MCV 85.8 88.1  ?PLT 274 257  ? ? ?Basic Metabolic Panel: ?Recent Labs  ?Lab 10-25-21 ?1401 25-Oct-2021 ?1815 10/14/21 ?0353 10/15/21 ?6440 10/16/21 ?0358  ?NA 140  --  142 140 140  ?K 4.0  --  3.7 3.7 3.2*  ?CL 101  --  107 102 102  ?CO2 28  --  30 33* 30  ?GLUCOSE 125*  --  126* 114* 100*  ?BUN 17  --  '17 16 19  '$ ?CREATININE 0.83  --  0.96 0.82 0.79  ?CALCIUM 10.0  --  9.4 8.9 8.9  ?MG  --  1.8  --  1.9 2.0  ?PHOS  --  4.0  --   --  4.0  ? ? ?GFR: ?Estimated Creatinine Clearance: 71.5 mL/min (by C-G formula based on SCr of 0.79 mg/dL). ?Liver Function  Tests: ?Recent Labs  ?Lab Oct 25, 2021 ?1401  ?AST 34  ?ALT 25  ?ALKPHOS 59  ?BILITOT 1.2  ?PROT 7.8  ?ALBUMIN 4.3  ? ? ?Radiology Studies: ?DG Abd Portable 1V-Small Bowel Obstruction Protocol-initial, 8 hr delay ? ?Result Date: 10/15/2021 ?CLINICAL DATA:  8 hour delay film EXAM: PORTABLE ABDOMEN - 1 VIEW COMPARISON:  Earlier today. FINDINGS: Oral contrast material is seen throughout dilated small bowel loops. There may be very early passage of contrast into the right colon. Findings compatible with high-grade small bowel obstruction. IMPRESSION: Contrast fills dilated small bowel loops, possibly just beginning to pass into the colon. Electronically Signed   By: Lennette Bihari  [place] M.D.   On: 10/15/2021 23:14  ? ?DG Abd Portable 1V ? ?Result Date: 10/15/2021 ?CLINICAL DATA:  NG tube placement EXAM: PORTABLE ABDOMEN - 1 VIEW COMPARISON:  10/15/2021 FINDINGS: Nasogastric tube tip and side port overlie the stomach. Persistent small bowel dilation with contrast remaining within small bowel. No definite contrast in the colon. No acute osseous abnormality. Surgical clips overlie the lower abdomen and pelvis. IMPRESSION: Nasogastric tube tip and side port overlie the stomach. Persistent small bowel obstruction. Electronically Signed   By: Maurine Simmering M.D.   On: 10/15/2021 08:51  ? ?DG Abd Portable 1V-Small Bowel Obstruction Protocol-24 hr delay ? ?Result Date: 10/15/2021 ?CLINICAL DATA:  Small-bowel obstruction. 24 hour postcontrast imaging EXAM: PORTABLE ABDOMEN - 1 VIEW COMPARISON:  10/14/2021, CTA 10/13/2021 FINDINGS: Contrast opacifies several dilated loops of small bowel within the pelvis. No definite contrast extension into the colon. No free intraperitoneal gas. Numerous surgical clips are seen in keeping with pelvic lymph node dissection. IMPRESSION: Persistent mid small bowel obstruction. No extension of contrast into the colon. Electronically Signed   By: Fidela Salisbury M.D.   On: 10/15/2021 00:44  ? ?DG Abd Portable 1V ? ?Result  Date: 10/14/2021 ?CLINICAL DATA:  Nasogastric tube placement EXAM: PORTABLE ABDOMEN - 1 VIEW COMPARISON:  Same day radiograph FINDINGS: Nasogastric tube side port overlies the distal esophagus, tip near the [place]

## 2021-10-16 NOTE — Progress Notes (Signed)
? ?Progress Note ? ?   ?Subjective: ?Still bloated and having pain and nausea. Took pain medication overnight. Not passing flatus or BM ? ? ?Objective: ?Vital signs in last 24 hours: ?Temp:  [98.3 ?F (36.8 ?C)-99.2 ?F (37.3 ?C)] 98.3 ?F (36.8 ?C) (04/06 0520) ?Pulse Rate:  [77-78] 78 (04/06 0520) ?Resp:  [16-18] 18 (04/06 0520) ?BP: (146-158)/(95-99) 146/99 (04/06 0520) ?SpO2:  [95 %-99 %] 95 % (04/06 0520) ?Weight:  [74.8 kg] 74.8 kg (04/06 0500) ?Last BM Date : 10/13/21 ? ?Intake/Output from previous day: ?04/05 0701 - 04/06 0700 ?In: 68 [P.O.:30; I.V.:382] ?Out: 1550 [Urine:500; Emesis/NG output:1050] ?Intake/Output this shift: ?No intake/output data recorded. ? ?PE: ?General: pleasant, WD, female who is laying in bed in NAD ?HEENT:  Mouth is pink and moist ?Heart: Palpable radial pulses bilaterally ?Lungs:  Respiratory effort nonlabored ?Abd: soft, TTP periumbilically, in suprapubic region, and in LLQ without rebound or guarding. NGT with dark bilious output - 1050 ml last 24 h ?MSK: all 4 extremities are symmetrical with no cyanosis, clubbing, or edema. ?Skin: warm and dry ?Psych: A&Ox3 with an appropriate affect.  ? ? ?Lab Results:  ?Recent Labs  ?  10/13/21 ?1401 10/14/21 ?0353  ?WBC 6.5 5.5  ?HGB 13.9 13.5  ?HCT 41.7 40.1  ?PLT 274 257  ? ? ?BMET ?Recent Labs  ?  10/15/21 ?6546 10/16/21 ?0358  ?NA 140 140  ?K 3.7 3.2*  ?CL 102 102  ?CO2 33* 30  ?GLUCOSE 114* 100*  ?BUN 16 19  ?CREATININE 0.82 0.79  ?CALCIUM 8.9 8.9  ? ? ?PT/INR ?No results for input(s): LABPROT, INR in the last 72 hours. ?CMP  ?   ?Component Value Date/Time  ? NA 140 10/16/2021 0358  ? K 3.2 (L) 10/16/2021 0358  ? CL 102 10/16/2021 0358  ? CO2 30 10/16/2021 0358  ? GLUCOSE 100 (H) 10/16/2021 0358  ? BUN 19 10/16/2021 0358  ? CREATININE 0.79 10/16/2021 0358  ? CREATININE 0.91 11/15/2019 1032  ? CALCIUM 8.9 10/16/2021 0358  ? PROT 7.8 10/13/2021 1401  ? ALBUMIN 4.3 10/13/2021 1401  ? AST 34 10/13/2021 1401  ? ALT 25 10/13/2021 1401  ?  ALKPHOS 59 10/13/2021 1401  ? BILITOT 1.2 10/13/2021 1401  ? GFRNONAA >60 10/16/2021 0358  ? GFRNONAA >60 11/15/2019 1032  ? GFRAA >60 02/08/2020 1208  ? GFRAA >60 11/15/2019 1032  ? ?Lipase  ?   ?Component Value Date/Time  ? LIPASE 30 10/13/2021 1401  ? ? ? ? ? ?Studies/Results: ?DG Abd Portable 1V-Small Bowel Obstruction Protocol-initial, 8 hr delay ? ?Result Date: 10/15/2021 ?CLINICAL DATA:  8 hour delay film EXAM: PORTABLE ABDOMEN - 1 VIEW COMPARISON:  Earlier today. FINDINGS: Oral contrast material is seen throughout dilated small bowel loops. There may be very early passage of contrast into the right colon. Findings compatible with high-grade small bowel obstruction. IMPRESSION: Contrast fills dilated small bowel loops, possibly just beginning to pass into the colon. Electronically Signed   By: Rolm Baptise M.D.   On: 10/15/2021 23:14  ? ?DG Abd Portable 1V ? ?Result Date: 10/15/2021 ?CLINICAL DATA:  NG tube placement EXAM: PORTABLE ABDOMEN - 1 VIEW COMPARISON:  10/15/2021 FINDINGS: Nasogastric tube tip and side port overlie the stomach. Persistent small bowel dilation with contrast remaining within small bowel. No definite contrast in the colon. No acute osseous abnormality. Surgical clips overlie the lower abdomen and pelvis. IMPRESSION: Nasogastric tube tip and side port overlie the stomach. Persistent small bowel obstruction. Electronically Signed  By: Maurine Simmering M.D.   On: 10/15/2021 08:51  ? ?DG Abd Portable 1V-Small Bowel Obstruction Protocol-24 hr delay ? ?Result Date: 10/15/2021 ?CLINICAL DATA:  Small-bowel obstruction. 24 hour postcontrast imaging EXAM: PORTABLE ABDOMEN - 1 VIEW COMPARISON:  10/14/2021, CTA 10/13/2021 FINDINGS: Contrast opacifies several dilated loops of small bowel within the pelvis. No definite contrast extension into the colon. No free intraperitoneal gas. Numerous surgical clips are seen in keeping with pelvic lymph node dissection. IMPRESSION: Persistent mid small bowel obstruction.  No extension of contrast into the colon. Electronically Signed   By: Fidela Salisbury M.D.   On: 10/15/2021 00:44  ? ?DG Abd Portable 1V ? ?Result Date: 10/14/2021 ?CLINICAL DATA:  Nasogastric tube placement EXAM: PORTABLE ABDOMEN - 1 VIEW COMPARISON:  Same day radiograph FINDINGS: Nasogastric tube side port overlies the distal esophagus, tip near the GE junction. Mild distension of the stomach with appearance of intraluminal contrast material in the fundus. There are persistent multiple dilated loops of small bowel. There are surgical clips overlying the lower abdomen. There is retained contrast material in the bladder. IMPRESSION: Nasogastric tube side port overlies the distal esophagus, tip near the GE junction. Recommend advancement by 9.0 cm. Persistent mildly dilated loops of small bowel suggesting small bowel obstruction. Electronically Signed   By: Maurine Simmering M.D.   On: 10/14/2021 11:43  ? ?DG Abd Portable 1V-Small Bowel Obstruction Protocol-initial, 8 hr delay ? ?Result Date: 10/14/2021 ?CLINICAL DATA:  Small-bowel obstruction EXAM: PORTABLE ABDOMEN - 1 VIEW COMPARISON:  10/13/2021 FINDINGS: Enteric tube is no longer seen within the field of view. There are several mildly dilated loops of small bowel within the abdomen measuring up to 3.2 cm in diameter, similar to the previous study. Air and stool seen within the colon. No gross free intraperitoneal air on supine view. Excreted contrast present within the urinary bladder. IMPRESSION: Persistent mildly dilated loops of small bowel within the abdomen measuring up to 3.2 cm in diameter, similar to the previous study. Continued radiographic follow-up recommended. Electronically Signed   By: Davina Poke D.O.   On: 10/14/2021 09:08   ? ?Anti-infectives: ?Anti-infectives (From admission, onward)  ? ? None  ? ?  ? ? ? ?Assessment/Plan ?SBO with history of ovarian cancer s/p TAH b/l SOO and chemo 2013 ?- CT 4/3 w/ Mid small bowel obstruction with a single point  transition within the mid jejunum within the periumbilical region anteriorly. Mild ?ascites. No free intraperitoneal gas. No evidence of bowel ischemia ?- No current indication for emergency surgery ?- has undergone protocol once and repeat started yesterday. 8 hour film with contrast throughout dilated SB but none in colon, NGT is not in stomach on xray. Still with symptoms and >1L output from NGT. NGT is small and not staying in position. Will exchange for larger one today and will confirm with xray ?- Continue NGT for decompression and keep NPO. Await 24 hour film ?- Keep K > 4 and Mg > 2 for bowel function ?- Mobilize for bowel function ?- Hopefully patient will improve with conservative management. If patient fails to improve with conservative management, she may require exploratory surgery during admission - undergoing second protocol and still without bowel function. May need repeat CT vs surgery in next day or so if still no improvement ?  ?FEN: NPO, NGT LIWS ?ID: none ?VTE: lovenox  ? ?I reviewed hospitalist notes, last 24 h vitals and pain scores, last 48 h intake and output, last 24 h labs and  trends, and last 24 h imaging results. ? ? LOS: 3 days  ? ?Winferd Humphrey, PA-C ?[facility] ?10/16/2021, 8:57 AM ?Please see Amion for pager number during day hours 7:00am-4:30pm ? ?

## 2021-10-17 ENCOUNTER — Other Ambulatory Visit: Payer: Self-pay

## 2021-10-17 ENCOUNTER — Inpatient Hospital Stay (HOSPITAL_COMMUNITY): Payer: Medicare HMO | Admitting: Certified Registered"

## 2021-10-17 ENCOUNTER — Encounter (HOSPITAL_COMMUNITY)
Admission: EM | Disposition: A | Discharge status: Home / Self Care | Payer: Self-pay | Source: Home / Self Care | Attending: Internal Medicine

## 2021-10-17 ENCOUNTER — Inpatient Hospital Stay (HOSPITAL_COMMUNITY): Payer: Medicare HMO

## 2021-10-17 ENCOUNTER — Encounter (HOSPITAL_COMMUNITY): Payer: Self-pay | Admitting: Internal Medicine

## 2021-10-17 DIAGNOSIS — K56609 Unspecified intestinal obstruction, unspecified as to partial versus complete obstruction: Secondary | ICD-10-CM | POA: Diagnosis not present

## 2021-10-17 HISTORY — PX: LAPAROSCOPIC LYSIS OF ADHESIONS: SHX5905

## 2021-10-17 HISTORY — PX: LAPAROSCOPY: SHX197

## 2021-10-17 LAB — BASIC METABOLIC PANEL
Anion gap: 9 (ref 5–15)
BUN: 24 mg/dL — ABNORMAL HIGH (ref 8–23)
CO2: 29 mmol/L (ref 22–32)
Calcium: 8.6 mg/dL — ABNORMAL LOW (ref 8.9–10.3)
Chloride: 104 mmol/L (ref 98–111)
Creatinine, Ser: 0.97 mg/dL (ref 0.44–1.00)
GFR, Estimated: >60 mL/min (ref >60)
Glucose, Bld: 100 mg/dL — ABNORMAL HIGH (ref 70–99)
Potassium: 3.7 mmol/L (ref 3.5–5.1)
Sodium: 142 mmol/L (ref 135–145)

## 2021-10-17 SURGERY — LAPAROSCOPY, DIAGNOSTIC
Anesthesia: General | Site: Abdomen

## 2021-10-17 MED ORDER — BUPIVACAINE LIPOSOME 1.3 % IJ SUSP
INTRAMUSCULAR | Status: AC
  Filled 2021-10-17: qty 20

## 2021-10-17 MED ORDER — LIDOCAINE 2% (20 MG/ML) 5 ML SYRINGE
INTRAMUSCULAR | Status: DC | PRN
  Administered 2021-10-17: 80 mg via INTRAVENOUS

## 2021-10-17 MED ORDER — CHLORHEXIDINE GLUCONATE 0.12 % MT SOLN
15.0000 mL | Freq: Once | OROMUCOSAL | Status: AC
  Administered 2021-10-17: 15 mL via OROMUCOSAL

## 2021-10-17 MED ORDER — ONDANSETRON HCL 4 MG/2ML IJ SOLN
INTRAMUSCULAR | Status: DC | PRN
  Administered 2021-10-17: 4 mg via INTRAVENOUS

## 2021-10-17 MED ORDER — ACETAMINOPHEN 10 MG/ML IV SOLN
INTRAVENOUS | Status: DC | PRN
  Administered 2021-10-17: 1000 mg via INTRAVENOUS

## 2021-10-17 MED ORDER — MIDAZOLAM HCL 2 MG/2ML IJ SOLN
INTRAMUSCULAR | Status: AC
  Filled 2021-10-17: qty 2

## 2021-10-17 MED ORDER — FENTANYL CITRATE (PF) 100 MCG/2ML IJ SOLN
INTRAMUSCULAR | Status: AC
  Filled 2021-10-17: qty 2

## 2021-10-17 MED ORDER — ENOXAPARIN SODIUM 40 MG/0.4ML IJ SOSY
40.0000 mg | PREFILLED_SYRINGE | INTRAMUSCULAR | Status: DC
  Administered 2021-10-18 – 2021-10-20 (×3): 40 mg via SUBCUTANEOUS
  Filled 2021-10-17 (×3): qty 0.4

## 2021-10-17 MED ORDER — SUCCINYLCHOLINE CHLORIDE 200 MG/10ML IV SOSY
PREFILLED_SYRINGE | INTRAVENOUS | Status: AC
  Filled 2021-10-17: qty 10

## 2021-10-17 MED ORDER — HYDROCODONE-ACETAMINOPHEN 5-325 MG PO TABS: 1.0000 | ORAL_TABLET | ORAL | Status: DC | PRN

## 2021-10-17 MED ORDER — CEFAZOLIN SODIUM-DEXTROSE 2-4 GM/100ML-% IV SOLN
INTRAVENOUS | Status: AC
Start: 2021-10-17 — End: 2021-10-17
  Filled 2021-10-17: qty 100

## 2021-10-17 MED ORDER — BUPIVACAINE-EPINEPHRINE (PF) 0.25% -1:200000 IJ SOLN
INTRAMUSCULAR | Status: AC
  Filled 2021-10-17: qty 30

## 2021-10-17 MED ORDER — PROPOFOL 10 MG/ML IV BOLUS
INTRAVENOUS | Status: AC
  Filled 2021-10-17: qty 20

## 2021-10-17 MED ORDER — ROCURONIUM BROMIDE 10 MG/ML (PF) SYRINGE
PREFILLED_SYRINGE | INTRAVENOUS | Status: AC
  Filled 2021-10-17: qty 10

## 2021-10-17 MED ORDER — ROCURONIUM BROMIDE 10 MG/ML (PF) SYRINGE
PREFILLED_SYRINGE | INTRAVENOUS | Status: DC | PRN
  Administered 2021-10-17: 40 mg via INTRAVENOUS
  Administered 2021-10-17: 10 mg via INTRAVENOUS

## 2021-10-17 MED ORDER — LIDOCAINE HCL (PF) 2 % IJ SOLN
INTRAMUSCULAR | Status: AC
  Filled 2021-10-17: qty 5

## 2021-10-17 MED ORDER — ONDANSETRON HCL 4 MG/2ML IJ SOLN
INTRAMUSCULAR | Status: AC
  Filled 2021-10-17: qty 2

## 2021-10-17 MED ORDER — BUPIVACAINE-EPINEPHRINE 0.25% -1:200000 IJ SOLN
INTRAMUSCULAR | Status: DC | PRN
  Administered 2021-10-17: 16 mL

## 2021-10-17 MED ORDER — DEXAMETHASONE SODIUM PHOSPHATE 10 MG/ML IJ SOLN
INTRAMUSCULAR | Status: AC
  Filled 2021-10-17: qty 1

## 2021-10-17 MED ORDER — 0.9 % SODIUM CHLORIDE (POUR BTL) OPTIME
TOPICAL | Status: DC | PRN
  Administered 2021-10-17: 1000 mL

## 2021-10-17 MED ORDER — OXYCODONE HCL 5 MG PO TABS: 5.0000 mg | ORAL_TABLET | Freq: Once | ORAL | Status: DC | PRN

## 2021-10-17 MED ORDER — CHLORHEXIDINE GLUCONATE CLOTH 2 % EX PADS
6.0000 | MEDICATED_PAD | Freq: Every day | CUTANEOUS | Status: DC
  Administered 2021-10-17 – 2021-10-20 (×3): 6 via TOPICAL

## 2021-10-17 MED ORDER — LACTATED RINGERS IV SOLN: INTRAVENOUS | Status: DC

## 2021-10-17 MED ORDER — FENTANYL CITRATE PF 50 MCG/ML IJ SOSY: 25.0000 ug | PREFILLED_SYRINGE | INTRAMUSCULAR | Status: DC | PRN

## 2021-10-17 MED ORDER — MIDAZOLAM HCL 5 MG/5ML IJ SOLN
INTRAMUSCULAR | Status: DC | PRN
  Administered 2021-10-17: 2 mg via INTRAVENOUS

## 2021-10-17 MED ORDER — SUGAMMADEX SODIUM 200 MG/2ML IV SOLN
INTRAVENOUS | Status: DC | PRN
  Administered 2021-10-17: 150 mg via INTRAVENOUS

## 2021-10-17 MED ORDER — DEXAMETHASONE SODIUM PHOSPHATE 10 MG/ML IJ SOLN
INTRAMUSCULAR | Status: DC | PRN
  Administered 2021-10-17: 10 mg via INTRAVENOUS

## 2021-10-17 MED ORDER — CEFAZOLIN SODIUM-DEXTROSE 1-4 GM/50ML-% IV SOLN
1.0000 g | Freq: Three times a day (TID) | INTRAVENOUS | Status: DC
  Administered 2021-10-17: 2 g via INTRAVENOUS
  Filled 2021-10-17 (×2): qty 50

## 2021-10-17 MED ORDER — FENTANYL CITRATE (PF) 250 MCG/5ML IJ SOLN
INTRAMUSCULAR | Status: DC | PRN
  Administered 2021-10-17 (×4): 50 ug via INTRAVENOUS

## 2021-10-17 MED ORDER — LACTATED RINGERS IR SOLN
Status: DC | PRN
  Administered 2021-10-17: 1000 mL

## 2021-10-17 MED ORDER — PROPOFOL 10 MG/ML IV BOLUS
INTRAVENOUS | Status: DC | PRN
  Administered 2021-10-17: 160 mg via INTRAVENOUS

## 2021-10-17 MED ORDER — SUCCINYLCHOLINE CHLORIDE 200 MG/10ML IV SOSY
PREFILLED_SYRINGE | INTRAVENOUS | Status: DC | PRN
  Administered 2021-10-17: 160 mg via INTRAVENOUS

## 2021-10-17 MED ORDER — OXYCODONE HCL 5 MG/5ML PO SOLN: 5.0000 mg | Freq: Once | ORAL | Status: DC | PRN

## 2021-10-17 MED ORDER — ONDANSETRON HCL 4 MG/2ML IJ SOLN: 4.0000 mg | Freq: Once | INTRAMUSCULAR | Status: DC | PRN

## 2021-10-17 MED ORDER — ACETAMINOPHEN 10 MG/ML IV SOLN
INTRAVENOUS | Status: AC
  Filled 2021-10-17: qty 100

## 2021-10-17 SURGICAL SUPPLY — 69 items
APPLIER CLIP 5 13 M/L LIGAMAX5 (MISCELLANEOUS)
APPLIER CLIP ROT 10 11.4 M/L (STAPLE)
BLADE SURG SZ10 CARB STEEL (BLADE) ×4 IMPLANT
CELLS DAT CNTRL 66122 CELL SVR (MISCELLANEOUS) IMPLANT
CHLORAPREP W/TINT 26 (MISCELLANEOUS) ×3 IMPLANT
CLIP APPLIE 5 13 M/L LIGAMAX5 (MISCELLANEOUS) IMPLANT
CLIP APPLIE ROT 10 11.4 M/L (STAPLE) IMPLANT
COVER SURGICAL LIGHT HANDLE (MISCELLANEOUS) ×3 IMPLANT
DERMABOND ADVANCED (GAUZE/BANDAGES/DRESSINGS) ×1
DERMABOND ADVANCED .7 DNX12 (GAUZE/BANDAGES/DRESSINGS) ×1 IMPLANT
DRAPE WARM FLUID 44X44 (DRAPES) ×2 IMPLANT
DRSG OPSITE POSTOP 4X6 (GAUZE/BANDAGES/DRESSINGS) IMPLANT
DRSG OPSITE POSTOP 4X8 (GAUZE/BANDAGES/DRESSINGS) IMPLANT
ELECT REM PT RETURN 15FT ADLT (MISCELLANEOUS) ×3 IMPLANT
GAUZE SPONGE 4X4 12PLY STRL (GAUZE/BANDAGES/DRESSINGS) ×1 IMPLANT
GLOVE BIO SURGEON STRL SZ7.5 (GLOVE) ×6 IMPLANT
GLOVE BIOGEL M STRL SZ7.5 (GLOVE) ×3 IMPLANT
GLOVE BIOGEL PI IND STRL 7.0 (GLOVE) ×2 IMPLANT
GLOVE BIOGEL PI INDICATOR 7.0 (GLOVE) ×1
GLOVE BIOGEL PI ORTHO PRO 7.5 (GLOVE) ×2
GLOVE PI ORTHO PRO STRL 7.5 (GLOVE) ×4 IMPLANT
GLOVE SRG 8 PF TXTR STRL LF DI (GLOVE) ×2 IMPLANT
GLOVE SURG UNDER POLY LF SZ8 (GLOVE) ×1
GOWN STRL REUS W/ TWL XL LVL3 (GOWN DISPOSABLE) ×12 IMPLANT
GOWN STRL REUS W/TWL XL LVL3 (GOWN DISPOSABLE) ×6
HANDLE SUCTION POOLE (INSTRUMENTS) IMPLANT
IRRIG SUCT STRYKERFLOW 2 WTIP (MISCELLANEOUS) ×3
IRRIGATION SUCT STRKRFLW 2 WTP (MISCELLANEOUS) ×1 IMPLANT
KIT BASIN OR (CUSTOM PROCEDURE TRAY) ×3 IMPLANT
KIT TURNOVER KIT A (KITS) ×2 IMPLANT
LIGASURE IMPACT 36 18CM CVD LR (INSTRUMENTS) IMPLANT
NS IRRIG 1000ML POUR BTL (IV SOLUTION) ×6 IMPLANT
RETRACTOR WND ALEXIS 18 MED (MISCELLANEOUS) IMPLANT
RTRCTR WOUND ALEXIS 18CM MED (MISCELLANEOUS)
SCISSORS LAP 5X35 DISP (ENDOMECHANICALS) ×3 IMPLANT
SHEARS FOC LG CVD HARMONIC 17C (MISCELLANEOUS) IMPLANT
SHEARS HARMONIC ACE PLUS 36CM (ENDOMECHANICALS) IMPLANT
SLEEVE XCEL OPT CAN 5 100 (ENDOMECHANICALS) ×7 IMPLANT
SPIKE FLUID TRANSFER (MISCELLANEOUS) ×1 IMPLANT
SPONGE T-LAP 18X18 ~~LOC~~+RFID (SPONGE) ×2 IMPLANT
STAPLER VISISTAT 35W (STAPLE) ×1 IMPLANT
STRIP CLOSURE SKIN 1/2X4 (GAUZE/BANDAGES/DRESSINGS) IMPLANT
SUCTION POOLE HANDLE (INSTRUMENTS)
SUT MNCRL AB 4-0 PS2 18 (SUTURE) ×2 IMPLANT
SUT PDS AB 1 CTX 36 (SUTURE) IMPLANT
SUT PDS AB 1 TP1 96 (SUTURE) IMPLANT
SUT PDS AB 3-0 SH 27 (SUTURE) ×1 IMPLANT
SUT PDS AB 4-0 SH 27 (SUTURE) IMPLANT
SUT PROLENE 2 0 BLUE (SUTURE) IMPLANT
SUT PROLENE 2 0 KS (SUTURE) IMPLANT
SUT PROLENE 2 0 SH DA (SUTURE) IMPLANT
SUT SILK 2 0 (SUTURE)
SUT SILK 2 0 SH CR/8 (SUTURE) ×2 IMPLANT
SUT SILK 2 0SH CR/8 30 (SUTURE) IMPLANT
SUT SILK 2-0 18XBRD TIE 12 (SUTURE) ×2 IMPLANT
SUT SILK 2-0 30XBRD TIE 12 (SUTURE) IMPLANT
SUT SILK 3 0 (SUTURE)
SUT SILK 3 0 SH CR/8 (SUTURE) ×2 IMPLANT
SUT SILK 3-0 18XBRD TIE 12 (SUTURE) ×2 IMPLANT
SYR BULB IRRIG 60ML STRL (SYRINGE) IMPLANT
SYS LAPSCP GELPORT 120MM (MISCELLANEOUS)
SYSTEM LAPSCP GELPORT 120MM (MISCELLANEOUS) IMPLANT
TOWEL OR 17X26 10 PK STRL BLUE (TOWEL DISPOSABLE) ×3 IMPLANT
TOWEL OR NON WOVEN STRL DISP B (DISPOSABLE) ×3 IMPLANT
TRAY FOLEY MTR SLVR 14FR STAT (SET/KITS/TRAYS/PACK) ×2 IMPLANT
TRAY FOLEY MTR SLVR 16FR STAT (SET/KITS/TRAYS/PACK) ×1 IMPLANT
TRAY LAPAROSCOPIC (CUSTOM PROCEDURE TRAY) ×3 IMPLANT
TROCAR XCEL NON-BLD 11X100MML (ENDOMECHANICALS) ×2 IMPLANT
YANKAUER SUCT BULB TIP NO VENT (SUCTIONS) IMPLANT

## 2021-10-17 NOTE — Progress Notes (Signed)
Patient ID: Leah Perkins, female   DOB: [birth year], 66 y.o.   MRN: [id number] ? ? ?Acute Care Surgery Service Progress Note:   ? ?Chief Complaint/Subjective: ?Feels rumbling in belly ?No flatus/ no bm ? ?Objective: ?Vital signs in last 24 hours: ?Temp:  [98 ?F (36.7 ?C)-99.3 ?F (37.4 ?C)] 98.5 ?F (36.9 ?C) (04/07 0529) ?Pulse Rate:  [81-93] 81 (04/07 0529) ?Resp:  [16-18] 18 (04/07 0529) ?BP: (157-163)/(98-105) 163/103 (04/07 0529) ?SpO2:  [97 %-98 %] 97 % (04/07 0529) ?Last BM Date : 10/13/21 ? ?Intake/Output from previous day: ?04/06 0701 - 04/07 0700 ?In: 2301.8 [P.O.:30; I.V.:1971.9; IV Piggyback:300] ?Out: 1700 [Emesis/NG output:1700] ?Intake/Output this shift: ?No intake/output data recorded. ? ?Lungs: cta, nonlabored ? ?Cardiovascular: reg ? ?Abd: soft, mild distended, mild TTP ? ?Extremities: no edema, +SCDs ? ?Neuro: alert, nonfocal ? ?Lab Results: ?CBC  ?No results for input(s): WBC, HGB, HCT, PLT in the last 72 hours. ?BMET ?Recent Labs  ?  10/16/21 ?0358 10/17/21 ?0412  ?NA 140 142  ?K 3.2* 3.7  ?CL 102 104  ?CO2 30 29  ?GLUCOSE 100* 100*  ?BUN 19 24*  ?CREATININE 0.79 0.97  ?CALCIUM 8.9 8.6*  ? ?LFT ? ?  Latest Ref Rng & Units 10/13/2021  ?  2:01 PM 03/11/2021  ?  9:29 AM 02/13/2019  ? 11:35 AM  ?Hepatic Function  ?Total Protein 6.5 - 8.1 g/dL 7.8   6.7   6.7    ?Albumin 3.5 - 5.0 g/dL 4.3   4.1   4.2    ?AST 15 - 41 U/L 34   24   22    ?ALT 0 - 44 U/L _0 ?Alk Phosphatase 38 - 126 U/L 59   51   54    ?Total Bilirubin 0.3 - 1.2 mg/dL 1.2   1.1   0.8    ?Bilirubin, Direct 0.0 - 0.2 mg/dL 0.2      ? ?PT/INR ?No results for input(s): LABPROT, INR in the last 72 hours. ?ABG ?No results for input(s): PHART, HCO3 in the last 72 hours. ? ?Invalid input(s): PCO2, PO2 ? ?Studies/Results: ? ?Anti-infectives: ?Anti-infectives (From admission, onward)  ? ? Start     Dose/Rate Route Frequency Ordered Stop  ? 10/17/21 0845  ceFAZolin (ANCEF) IVPB 1 g/50 mL premix       ? 1 g ?100 mL/hr over 30 Minutes  Intravenous Every 8 hours 10/17/21 0746    ? ?  ? ? ?Medications: ?Scheduled Meds: ? enoxaparin (LOVENOX) injection  40 mg Subcutaneous Q24H  ? lip balm  1 application. Topical BID  ? ?Continuous Infusions: ? 0.9 % NaCl with KCl 20 mEq / L 100 mL/hr at 10/17/21 3818  ?  ceFAZolin (ANCEF) IV    ? famotidine (PEPCID) IV Stopped (10/16/21 1428)  ? methocarbamol (ROBAXIN) IV    ? ondansetron (ZOFRAN) IV    ? ?PRN Meds:.acetaminophen, alum & mag hydroxide-simeth, hydrALAZINE, HYDROmorphone (DILAUDID) injection, magic mouthwash, menthol-cetylpyridinium, methocarbamol (ROBAXIN) IV, ondansetron (ZOFRAN) IV **OR** ondansetron (ZOFRAN) IV, phenol, prochlorperazine, simethicone ? ?Assessment/Plan: ?Patient Active Problem List  ? Diagnosis Date Noted  ? SBO (small bowel obstruction) ([place]) 10/13/2021  ? History of ovarian cancer 10/13/2021  ? Hypertension 02/13/2019  ? Hyperlipidemia 02/13/2019  ? Genetic testing 12/16/2017  ? Family history of lymphoma   ? Sternoclavicular joint strain 03/07/2014  ? BRCA negative 09/27/2013  ? Ovarian cancer 09/22/2012  ? Family history of cardiomyopathy 08/05/2012  ? ?  SBO with history of ovarian cancer s/p TAH b/l SOO and chemo 2013 ?- CT 4/3 w/ Mid small bowel obstruction with a single point transition within the mid jejunum within the periumbilical region anteriorly. Mild ?ascites. No free intraperitoneal gas. No evidence of bowel ischemia ?- No current indication for emergency surgery ?- has undergone protocol once and repeat started yesterday. 8 hour film with contrast throughout dilated SB but none in colon, NGT is not in stomach on xray. Still with symptoms and >1L output from NGT. NGT is small and not staying in position. Will exchange for larger one today and will confirm with xray ?- Continue NGT for decompression and keep NPO. ?- Keep K > 4 and Mg > 2 for bowel function ?- Mobilize for bowel function ?- xray still shows contrast in SB. No bowel function ? ?At this point, I think we  have failed nonop mgmt. Not worsening but just not improving.  Recommended diagnostic laparoscopy, poss exp lap, poss bowel resection to pt and husband via phone.   ? ?I discussed the procedure in detail.    We discussed the risks and benefits of surgery including, but not limited to bleeding, infection (such as wound infection, abdominal abscess), injury to surrounding structures, blood clot formation, urinary retention,  incisional hernia, (anastomotic stricture, anastomotic leak if bowel resection performed), anesthesia risks, pulmonary & cardiac complications such as pneumonia &/or heart attack, need for additional procedures, ileus, & prolonged hospitalization.  We discussed the typical postoperative recovery course, including limitations & restrictions postoperatively. I explained that the likelihood of improvement in their symptoms is good. ? ?  ?FEN: NPO, NGT LIWS ?ID: none ?VTE: lovenox  ?  ?I reviewed hospitalist notes, last 24 h vitals and pain scores, last 48 h intake and output, last 24 h labs and trends, and last 24 h imaging results. ?Disposition: to OR later this am, IV abx on call.  ? LOS: 4 days  ? ? ?Leighton Ruff. Redmond Pulling, MD, FACS ?General, Bariatric, & Minimally Invasive Surgery ?[phone] ?[facility] ? ?

## 2021-10-17 NOTE — Anesthesia Procedure Notes (Signed)
Procedure Name: Intubation ?Date/Time: 10/17/2021 8:58 AM ?Performed by: Nyles Mitton D, CRNA ?Pre-anesthesia Checklist: Patient identified, Emergency Drugs available, Suction available and Patient being monitored ?Patient Re-evaluated:Patient Re-evaluated prior to induction ?Oxygen Delivery Method: Circle system utilized ?Preoxygenation: Pre-oxygenation with 100% oxygen ?Induction Type: IV induction, Rapid sequence and Cricoid Pressure applied ?Laryngoscope Size: Mac and 3 ?Grade View: Grade I ?Tube type: Oral ?Number of attempts: 1 ?Airway Equipment and Method: Stylet ?Placement Confirmation: ETT inserted through vocal cords under direct vision, positive ETCO2 and breath sounds checked- equal and bilateral ?Secured at: 21 cm ?Tube secured with: Tape ?Dental Injury: Teeth and Oropharynx as per pre-operative assessment  ? ? ? ? ?

## 2021-10-17 NOTE — Anesthesia Preprocedure Evaluation (Addendum)
Anesthesia Evaluation  ?Patient identified by MRN, date of birth, ID band ?Patient awake ? ? ? ?Reviewed: ?Allergy & Precautions, NPO status , Patient's Chart, lab work & pertinent test results, reviewed documented beta blocker date and time  ? ?History of Anesthesia Complications ?Negative for: history of anesthetic complications ? ?Airway ?Mallampati: II ? ?TM Distance: >3 FB ?Neck ROM: Full ? ? ? Dental ? ?(+) Dental Advisory Given, Teeth Intact ?  ?Pulmonary ?neg pulmonary ROS,  ?  ?Pulmonary exam normal ? ? ? ? ? ? ? Cardiovascular ?hypertension, Pt. on medications and Pt. on home beta blockers ?Normal cardiovascular exam ? ? ?  ?Neuro/Psych ?negative neurological ROS ? negative psych ROS  ? GI/Hepatic ?Neg liver ROS,  ?Bowel obstruction ? ?  ?Endo/Other  ?negative endocrine ROS ? Renal/GU ?negative Renal ROS  ? ?  ?Musculoskeletal ?negative musculoskeletal ROS ?(+)  ? Abdominal ?  ?Peds ? Hematology ?negative hematology ROS ?(+)   ?Anesthesia Other Findings ? ? Reproductive/Obstetrics ? ?Ovarian cancer s/p TAH/BSO ? ? ?  ? ? ? ? ? ? ? ? ? ? ? ? ? ?  ?  ? ? ? ? ? ? ? ?Anesthesia Physical ?Anesthesia Plan ? ?ASA: 2 ? ?Anesthesia Plan: General  ? ?Post-op Pain Management: Ofirmev IV (intra-op)* and Dilaudid IV  ? ?Induction: Rapid sequence and Intravenous ? ?PONV Risk Score and Plan: 3 and Treatment may vary due to age or medical condition, Ondansetron, Dexamethasone and Midazolam ? ?Airway Management Planned: Oral ETT ? ?Additional Equipment: None ? ?Intra-op Plan:  ? ?Post-operative Plan: Extubation in OR ? ?Informed Consent: I have reviewed the patients History and Physical, chart, labs and discussed the procedure including the risks, benefits and alternatives for the proposed anesthesia with the patient or authorized representative who has indicated his/her understanding and acceptance.  ? ? ? ?Dental advisory given ? ?Plan Discussed with: CRNA and  Anesthesiologist ? ?Anesthesia Plan Comments:   ? ? ? ? ? ?Anesthesia Quick Evaluation ? ?

## 2021-10-17 NOTE — Transfer of Care (Signed)
Immediate Anesthesia Transfer of Care Note ? ?Patient: Leah Perkins ? ?Procedure(s) Performed: LAPAROSCOPY DIAGNOSTIC (Abdomen) ?LAPAROSCOPIC LYSIS OF ADHESIONS (Abdomen) ? ?Patient Location: PACU ? ?Anesthesia Type:General ? ?Level of Consciousness: awake, alert  and oriented ? ?Airway & Oxygen Therapy: Patient Spontanous Breathing and Patient connected to face mask oxygen ? ?Post-op Assessment: Report given to RN and Post -op Vital signs reviewed and stable ? ?Post vital signs: Reviewed and stable ? ?Last Vitals:  ?Vitals Value Taken Time  ?BP    ?Temp    ?Pulse    ?Resp    ?SpO2    ? ? ?Last Pain:  ?Vitals:  ? 10/17/21 0814  ?TempSrc:   ?PainSc: 0-No pain  ?   ? ?Patients Stated Pain Goal: 2 (10/15/21 2327) ? ?Complications: No notable events documented. ?

## 2021-10-17 NOTE — Op Note (Signed)
10/17/2021 ? ?10:06 AM ? ?PATIENT:  Leah Perkins  66 y.o. female ? ?PRE-OPERATIVE DIAGNOSIS:  small bowel obstruction ? ?POST-OPERATIVE DIAGNOSIS:  small bowel obstruction ? ?PROCEDURE:  Procedure(s): ?LAPAROSCOPY DIAGNOSTIC ?LAPAROSCOPIC LYSIS OF ADHESIONS ?Laparoscopic bilateral tap block ? ?SURGEON:  Surgeon(s): ?Greer Pickerel, MD ? ?ASSISTANTS: none  ? ?ANESTHESIA:   general ? ?EBL: <25 mL ? ?DRAINS: Nasogastric Tube and Urinary Catheter (Foley)  ? ?LOCAL MEDICATIONS USED:  MARCAINE    ? ?SPECIMEN:  No Specimen ? ?DISPOSITION OF SPECIMEN:  N/A ? ?COUNTS:  YES ? ?INDICATION FOR PROCEDURE: 66 year old female admitted a few days ago with a partial small bowel obstruction.  She has a remote history of total abdominal hysterectomy and BSO and debulking surgery for cancer from about 10 years ago.  She was nontoxic-appearing.  She was started on our small bowel obstruction protocol.  There was some intermittent issues with her NG tube not being in the correct location.  Unfortunately contrast remained in her small bowel without any signs of bowel function.  The patient remained obstipated.  This morning I recommended proceeding to the operating room since she was not clinically improving.  I had extensive discussion with patient and her husband regarding risk and benefits and the typical aftercare and potential findings during surgery and what may need to be done during surgery.  This was all separately documented ? ?PROCEDURE: After obtaining informed consent the patient was taken the OR for at [facility] placed upon on the operating room table.  General endotracheal anesthesia was established.  Sequential compression devices were placed.  Her left arm was tucked at her side with the appropriate padding.  A Foley catheter had been placed.  Her abdomen was prepped and draped in the usual standard surgical fashion.  She received IV antibiotic prior to skin incision.  She had a lower midline incision that was  slightly off the midline slightly to the left of the abdomen.  A surgical timeout was performed.  I decided to gain access to the abdomen using Optiview technique.  A small incision was made at Palmer's point.  Then using a 0 degree 5 mm laparoscope through a 5 mm trocar I carefully advanced it through all layers the abdominal wall and carefully entered the abdominal cavity.  Pneumoperitoneum was smoothly established up to a patient pressure of 15 mmHg without any change in patient vital signs.  The laparoscope was advanced and the abdominal cavity was surveilled.  There was no evidence of injury to surrounding structures.  There was small bowel adhered to the anterior abdominal wall in the lower midline primarily around the umbilical region which corresponded to the CT scan.  There was decompressed small bowel distally in dilated bowel upstream from this.  2 additional 5 mm trocars were then placed along the left lateral abdominal wall under direct visualization after local had been infiltrated.  I then carefully started lysing the adhesions using EndoShears without electrocautery using atraumatic bowel grasper and my other hand.  I did ended up taking some of the peritoneum off the abdominal wall because I would did not want to get into the small bowel.  It took about 20 minutes to free all the small bowel from the anterior abdominal wall.  My partner Dr. Ninfa Linden had joined me in the operating room and was observing the surgery.  I then decided to run the bowel to make sure there were no other significant points of obstruction.  Identified the terminal ileum  which was decompressed and started running back the bowel proximally.  In the proximal ileum distal jejunum there is an interloop adhesion which was taken down with EndoShears without electrocautery.  I continued to run the bowel proximally.  I came across the area that had been adhered to the anterior abdominal wall.  There was no evidence of enterotomy or  serosal tear.  There was a little bit of a intraloop fold which was also taken down.  We then came across the section of the small bowel that had definitely been the point of obstruction which was the section of the small bowel that had been adhered to the anterior abdominal wall.  There is no evidence of chronic stricture.  There is no evidence of enterotomy or serosal tear.  I did not feel that we needed to resect this section of the intestine that had been adhered to the anterior abdominal wall.  At this point the bowel was dilated since it was upstream from the point of obstruction.  I ran it for a few additional feet.  There is no evidence of injury to any of the bowel.  I then performed a laparoscopic bilateral tap block with local for postoperative pain relief.  I I suctioned out some of the ascites from the abdomen.  I checked the optical entry site and there is no evidence of injury to the bowel in that location pneumoperitoneum was released.  Trocars were removed.  Skin incisions were closed with a 4-0 Monocryl followed by the application of Dermabond.  All needle, instrument, and sponge counts were correct x2.  There were no immediate complications.  Patient was extubated and taken to the recovery room in stable condition ? ?PLAN OF CARE:  already inpatient ? ?PATIENT DISPOSITION:  PACU - hemodynamically stable. ?  ?Delay start of Pharmacological VTE agent (>24hrs) due to surgical blood loss or risk of bleeding:  no ? ?Leighton Ruff. Redmond Pulling, MD, FACS ?General, Bariatric, & Minimally Invasive Surgery ?[facility] ? ? ? ? ? ? ? ? ? ? ? ? ? ?

## 2021-10-17 NOTE — Progress Notes (Signed)
?PROGRESS NOTE ?Leah BACKS  Perkins:[id number] DOB: 1956/02/25 DOA: 10/13/2021 ?PCP: Howard Pouch A, DO  ? ?Brief Narrative/Hospital Course: ?66 y.o. female with medical history significant for ovarian cancer (s/p TAH b/l SOO and adjuvant chemotherapy completed 03/2012 with no evidence of recurrence at last Gyn/Onc visit 11/13/20), hypertension presented with epigastric pain since 4/3, feeling bloated.  Had several bowel movement orally and noted later with abdominal pain getting more severe she came to the ED.  ?In the ED underwent further labs imaging ED positive nitrite WBC 0-5, chest x-ray no active disease CT chest abdomen pelvis no aneurysm or dissection but mainly small bowel obstruction with single point transition in the mid jejunum within the periumbilical region.Patient was given IV fluids Zofran Dilaudid, general surgery consulted placed NG tube and admitted under hospitalist service.  ? ?Subjective: ?+seen examined  ?Husband at bedside ?This morning electrolytes stable, NG ouput up 1731m. ?Patient underwent diagnostic laparoscopy with LOA this am ? ?Assessment and Plan: ?Principal Problem: ?  [place] ?Active Problems: ?  Hypertension ?  Ovarian cancer ?  History of ovarian cancer ? ?SBO in the setting of previous surgery  ?History of ovarian cancer status post bilateral S/P chemo 2013: ?Did not improve on conservative management NG decompression IV fluids NPO.  Multiple x-ray studies showing persistent small bowel dilation and contrast-taken to the OR this morning and s/p diagnostic laparoscopy with LOA.  Continue n.p.o./diet, IVF pain control as per surgery team.  Will request surgery to take over as primary team ? ?Hypokalemia resolved.  Continue NS with potassium  ?Hypertension: BP fairly controlled.Holding po atenolol, using IV hydralazine as needed while NPO. ?History of ovarian cancer:S/p TAH b/l SOO and adjuvant chemotherapy and surgical debulking  ?completed 03/2012 with  no evidence of recurrence at last Gyn/Onc visit 11/13/20. ? ?DVT prophylaxis: enoxaparin (LOVENOX) injection 40 mg Start: 10/18/21 0800 ?SCDs Start: 10/13/21 2336 ?Code Status:   Code Status: Full Code ?Family Communication: plan of care discussed with patient at bedside. ?Patient status is: Inpatient level of care: Med-Surg  ?Remains inpatient because: Ongoing management of SBO ?Patient currently not stable ? ?Dispo: The patient is from: Home with family ?           Anticipated disposition: Home ? ?Mobility Assessment (last 72 hours)   ? ? Mobility Assessment   ? ? RSt. BonaventureName 10/16/21 2025 10/16/21 0800 10/16/21 0700 10/15/21 2129 10/15/21 1156  ? Does patient have an order for bedrest or is patient medically unstable No - Continue assessment No - Continue assessment -- No - Continue assessment No - Continue assessment  ? What is the highest level of mobility based on the progressive mobility assessment? Level 5 (Walks with assist in room/hall) - Balance while stepping forward/back and can walk in room with assist - Complete Level 5 (Walks with assist in room/hall) - Balance while stepping forward/back and can walk in room with assist - Complete -- Level 5 (Walks with assist in room/hall) - Balance while stepping forward/back and can walk in room with assist - Complete Level 5 (Walks with assist in room/hall) - Balance while stepping forward/back and can walk in room with assist - Complete  ? ? RSedgwickName 10/14/21 1940  ?  ?  ?  ?  ? Does patient have an order for bedrest or is patient medically unstable No - Continue assessment      ? What is the highest level of mobility based on the progressive mobility assessment?  Level 5 (Walks with assist in room/hall) - Balance while stepping forward/back and can walk in room with assist - Complete      ? ?  ?  ? ?  ?  ? ?Objective: ?Vitals last 24 hrs: ?Vitals:  ? 10/17/21 1032 10/17/21 1045 10/17/21 1100 10/17/21 1142  ?BP: (!) 147/95 (!) 151/88 (!) 151/96 (!) 150/100  ?Pulse:  82 87 86 83  ?Resp: '19 15 15 14  '$ ?Temp:   98 ?F (36.7 ?C) 98.1 ?F (36.7 ?C)  ?TempSrc:    Oral  ?SpO2: 96% 96% 96% 97%  ?Weight:      ?Height:      ? ?Weight change:  ? ?Physical Examination: ?General exam: AA0x3, older than stated age, weak appearing. ?HEENT:Oral mucosa moist, Ear/Nose WNL grossly, dentition normal. ?Respiratory system: bilaterally clear, no use of accessory muscle ?Cardiovascular system: S1 & S2 +, No JVD,. ?Gastrointestinal system: Abdomen soft, surgical glue in place, mildly distended. ?Nervous System:Alert, awake, moving extremities and grossly nonfocal ?Extremities: LE ankle no edema, distal peripheral pulses palpable.  ?Skin: No rashes,no icterus. ?MSK: Normal muscle bulk,tone, power  ? ?Medications reviewed:  ?Scheduled Meds: ? [START ON 10/18/2021] enoxaparin (LOVENOX) injection  40 mg Subcutaneous Q24H  ? lip balm  1 application. Topical BID  ? ?Continuous Infusions: ? 0.9 % NaCl with KCl 20 mEq / L 100 mL/hr at 10/17/21 0629  ? famotidine (PEPCID) IV Stopped (10/16/21 1428)  ? methocarbamol (ROBAXIN) IV    ? ondansetron (ZOFRAN) IV    ? ? ?  ?Diet Order   ? ?       ?  Diet NPO time specified Except for: Ice Chips  Diet effective now       ?  ? ?  ?  ? ?  ?  ?Intake/Output Summary (Last 24 hours) at 10/17/2021 1148 ?Last data filed at 10/17/2021 1100 ?Gross per 24 hour  ?Intake 3551.84 ml  ?Output 1100 ml  ?Net 2451.84 ml  ? ?Net IO Since Admission: 1,235.47 mL [10/17/21 1148]  ?Wt Readings from Last 3 Encounters:  ?10/16/21 74.8 kg  ?09/09/21 73 kg  ?03/11/21 72.1 kg  ?  ? ?Unresulted Labs (From admission, onward)  ? ?  Start     Ordered  ? 10/18/21 9678  Basic metabolic panel  Tomorrow morning,   R       ? 10/17/21 1143  ? 10/18/21 0500  CBC with Differential/Platelet  Tomorrow morning,   R       ? 10/17/21 1143  ? 10/18/21 0500  Magnesium  Tomorrow morning,   R       ? 10/17/21 1143  ? ?  ?  ? ?  ?Data Reviewed: I have personally reviewed following labs and imaging studies ?CBC: ?Recent Labs   ?Lab 10/13/21 ?1401 10/14/21 ?0353  ?WBC 6.5 5.5  ?HGB 13.9 13.5  ?HCT 41.7 40.1  ?MCV 85.8 88.1  ?PLT 274 257  ? ?Basic Metabolic Panel: ?Recent Labs  ?Lab 10/13/21 ?1401 10/13/21 ?1815 10/14/21 ?0353 10/15/21 ?9381 10/16/21 ?0175 10/17/21 ?1025  ?NA 140  --  142 140 140 142  ?K 4.0  --  3.7 3.7 3.2* 3.7  ?CL 101  --  107 102 102 104  ?CO2 28  --  30 33* 30 29  ?GLUCOSE 125*  --  126* 114* 100* 100*  ?BUN 17  --  '17 16 19 '$ 24*  ?CREATININE 0.83  --  0.96 0.82 0.79 0.97  ?CALCIUM 10.0  --  9.4 8.9 8.9 8.6*  ?MG  --  1.8  --  1.9 2.0  --   ?PHOS  --  4.0  --   --  4.0  --   ? ?GFR: ?Estimated Creatinine Clearance: 59 mL/min (by C-G formula based on SCr of 0.97 mg/dL). ?Liver Function Tests: ?Recent Labs  ?Lab 10/13/21 ?1401  ?AST 34  ?ALT 25  ?ALKPHOS 59  ?BILITOT 1.2  ?PROT 7.8  ?ALBUMIN 4.3  ? ?Radiology Studies: ?DG Abd Portable 1V ? ?Result Date: 10/17/2021 ?CLINICAL DATA:  Small bowel obstruction EXAM: PORTABLE ABDOMEN - 1 VIEW COMPARISON:  Yesterday FINDINGS: Diffuse fluid-filled small bowel dilatation with retention of contrast. The enteric tube remains looped at the level of the stomach, which is decompressed. No concerning mass effect or gas collection. IMPRESSION: Persistent small bowel dilatation and contrast retention. Electronically Signed   By: Jorje Guild M.D.   On: 10/17/2021 06:17  ? ?DG Abd Portable 1V ? ?Result Date: 10/16/2021 ?CLINICAL DATA:  Confirm nasogastric tube placement EXAM: PORTABLE ABDOMEN - 1 VIEW COMPARISON:  Abdominal radiograph 10/15/2021 FINDINGS: Nasogastric tube tip and side port overlie the stomach. There are persistently dilated loops of small bowel with some residual contrast seen in the distal small bowel and possibly within the cecum. IMPRESSION: Nasogastric tube tip and side port overlie the stomach. Persistent small bowel dilation with residual intraluminal contrast seen in the distal small bowel and possibly within the cecum. Electronically Signed   By: Maurine Simmering M.D.    On: 10/16/2021 12:20  ? ?DG Abd Portable 1V-Small Bowel Obstruction Protocol-initial, 8 hr delay ? ?Result Date: 10/15/2021 ?CLINICAL DATA:  8 hour delay film EXAM: PORTABLE ABDOMEN - 1 VIEW COMPARISON:

## 2021-10-18 DIAGNOSIS — K56609 Unspecified intestinal obstruction, unspecified as to partial versus complete obstruction: Secondary | ICD-10-CM | POA: Diagnosis not present

## 2021-10-18 LAB — BASIC METABOLIC PANEL
Anion gap: 7 (ref 5–15)
BUN: 18 mg/dL (ref 8–23)
CO2: 27 mmol/L (ref 22–32)
Calcium: 8.4 mg/dL — ABNORMAL LOW (ref 8.9–10.3)
Chloride: 107 mmol/L (ref 98–111)
Creatinine, Ser: 0.71 mg/dL (ref 0.44–1.00)
GFR, Estimated: >60 mL/min (ref >60)
Glucose, Bld: 102 mg/dL — ABNORMAL HIGH (ref 70–99)
Potassium: 3.6 mmol/L (ref 3.5–5.1)
Sodium: 141 mmol/L (ref 135–145)

## 2021-10-18 LAB — CBC WITH DIFFERENTIAL/PLATELET
Abs Immature Granulocytes: 0.01 10*3/uL (ref 0.00–0.07)
Basophils Absolute: 0 10*3/uL (ref 0.0–0.1)
Basophils Relative: 0 %
Eosinophils Absolute: 0.1 10*3/uL (ref 0.0–0.5)
Eosinophils Relative: 1 %
HCT: 35.7 % — ABNORMAL LOW (ref 36.0–46.0)
Hemoglobin: 11.9 g/dL — ABNORMAL LOW (ref 12.0–15.0)
Immature Granulocytes: 0 %
Lymphocytes Relative: 20 %
Lymphs Abs: 0.9 10*3/uL (ref 0.7–4.0)
MCH: 29 pg (ref 26.0–34.0)
MCHC: 33.3 g/dL (ref 30.0–36.0)
MCV: 87.1 fL (ref 80.0–100.0)
Monocytes Absolute: 0.5 10*3/uL (ref 0.1–1.0)
Monocytes Relative: 12 %
Neutro Abs: 2.8 10*3/uL (ref 1.7–7.7)
Neutrophils Relative %: 67 %
Platelets: 217 10*3/uL (ref 150–400)
RBC: 4.1 MIL/uL (ref 3.87–5.11)
RDW: 14.1 % (ref 11.5–15.5)
WBC: 4.2 10*3/uL (ref 4.0–10.5)
nRBC: 0 % (ref 0.0–0.2)

## 2021-10-18 LAB — MAGNESIUM: Magnesium: 2.2 mg/dL (ref 1.7–2.4)

## 2021-10-18 MED ORDER — ATENOLOL 25 MG PO TABS
12.5000 mg | ORAL_TABLET | Freq: Every morning | ORAL | Status: DC
  Administered 2021-10-18 – 2021-10-20 (×3): 12.5 mg via ORAL
  Filled 2021-10-18 (×3): qty 1

## 2021-10-18 MED ORDER — TRAMADOL HCL 50 MG PO TABS: 50.0000 mg | ORAL_TABLET | Freq: Four times a day (QID) | ORAL | Status: DC | PRN

## 2021-10-18 MED ORDER — ACETAMINOPHEN 500 MG PO TABS
1000.0000 mg | ORAL_TABLET | Freq: Three times a day (TID) | ORAL | Status: DC
  Administered 2021-10-18: 1000 mg via ORAL
  Filled 2021-10-18 (×2): qty 2

## 2021-10-18 NOTE — Progress Notes (Signed)
Patient ID: Leah Perkins, female   DOB: Dec 11, 1955, 66 y.o.   MRN: [id number] ? ? ?Acute Care Surgery Service Progress Note:   ? ?Chief Complaint/Subjective: ?HTN overnight ?Pain ok ?Several BMs, and having flatus ?Husband on phone ? ?Objective: ?Vital signs in last 24 hours: ?Temp:  [97.7 ?F (36.5 ?C)-98.4 ?F (36.9 ?C)] 98.3 ?F (36.8 ?C) (04/08 0559) ?Pulse Rate:  [79-93] 89 (04/08 0559) ?Resp:  [14-20] 14 (04/08 0559) ?BP: (136-188)/(88-107) 188/106 (04/08 0559) ?SpO2:  [94 %-98 %] 98 % (04/08 0559) ?Last BM Date : 10/17/21 ? ?Intake/Output from previous day: ?04/07 0701 - 04/08 0700 ?In: 1561.3 [P.O.:30; I.V.:1381.3; IV Piggyback:150] ?Out: 1950 [Urine:1400; Emesis/NG output:550] ?Intake/Output this shift: ?No intake/output data recorded. ? ?Lungs: cta, nonlabored ? ?Cardiovascular: reg ? ?Abd: soft, min distension and min TTP; incisions ok ? ?Extremities: no edema, +SCDs ? ?Neuro: alert, nonfocal ? ?Lab Results: ?CBC  ?Recent Labs  ?  10/18/21 ?0457  ?WBC 4.2  ?HGB 11.9*  ?HCT 35.7*  ?PLT 217  ? ?BMET ?Recent Labs  ?  10/17/21 ?0412 10/18/21 ?7847  ?NA 142 141  ?K 3.7 3.6  ?CL 104 107  ?CO2 29 27  ?GLUCOSE 100* 102*  ?BUN 24* 18  ?CREATININE 0.97 0.71  ?CALCIUM 8.6* 8.4*  ? ?LFT ? ?  Latest Ref Rng & Units 10/13/2021  ?  2:01 PM 03/11/2021  ?  9:29 AM 02/13/2019  ? 11:35 AM  ?Hepatic Function  ?Total Protein 6.5 - 8.1 g/dL 7.8   6.7   6.7    ?Albumin 3.5 - 5.0 g/dL 4.3   4.1   4.2    ?AST 15 - 41 U/L 34   24   22    ?ALT 0 - 44 U/L _0 ?Alk Phosphatase 38 - 126 U/L 59   51   54    ?Total Bilirubin 0.3 - 1.2 mg/dL 1.2   1.1   0.8    ?Bilirubin, Direct 0.0 - 0.2 mg/dL 0.2      ? ?PT/INR ?No results for input(s): LABPROT, INR in the last 72 hours. ?ABG ?No results for input(s): PHART, HCO3 in the last 72 hours. ? ?Invalid input(s): PCO2, PO2 ? ?Studies/Results: ? ?Anti-infectives: ?Anti-infectives (From admission, onward)  ? ? Start     Dose/Rate Route Frequency Ordered Stop  ? 10/17/21 0845  ceFAZolin  (ANCEF) IVPB 1 g/50 mL premix  Status:  Discontinued       ? 1 g ?100 mL/hr over 30 Minutes Intravenous Every 8 hours 10/17/21 0746 10/17/21 1143  ? 10/17/21 0841  ceFAZolin (ANCEF) 2-4 GM/100ML-% IVPB       ?Note to Pharmacy: Abelino Derrick: cabinet override  ?    10/17/21 0841 10/17/21 1031  ? ?  ? ? ?Medications: ?Scheduled Meds: ? acetaminophen  1,000 mg Oral Q8H  ? atenolol  12.5 mg Oral q morning  ? Chlorhexidine Gluconate Cloth  6 each Topical Daily  ? enoxaparin (LOVENOX) injection  40 mg Subcutaneous Q24H  ? lip balm  1 application. Topical BID  ? ?Continuous Infusions: ? 0.9 % NaCl with KCl 20 mEq / L 100 mL/hr at 10/17/21 1653  ? famotidine (PEPCID) IV 20 mg (10/17/21 1249)  ? methocarbamol (ROBAXIN) IV    ? ondansetron (ZOFRAN) IV    ? ?PRN Meds:.hydrALAZINE, HYDROmorphone (DILAUDID) injection, magic mouthwash, menthol-cetylpyridinium, methocarbamol (ROBAXIN) IV, ondansetron (ZOFRAN) IV **OR** ondansetron (ZOFRAN) IV, phenol, prochlorperazine, simethicone, traMADol ? ?Assessment/Plan: ?  Patient Active Problem List  ? Diagnosis Date Noted  ? SBO (small bowel obstruction) ([place]) 10/13/2021  ? History of ovarian cancer 10/13/2021  ? Hypertension 02/13/2019  ? Hyperlipidemia 02/13/2019  ? Genetic testing 12/16/2017  ? Family history of lymphoma   ? Sternoclavicular joint strain 03/07/2014  ? BRCA negative 09/27/2013  ? Ovarian cancer 09/22/2012  ? Family history of cardiomyopathy 08/05/2012  ? ?s/p Procedure(s): ?LAPAROSCOPY DIAGNOSTIC ?LAPAROSCOPIC LYSIS OF ADHESIONS 10/17/2021 ? ?Dc ng tube ?Start CLD ?Po meds ?Cont chemical vte prophylaxis ?HTN per [facility] ?Foley out ? ?Disposition: possible home Sunday; showed and discussed intraop findings with pt ? LOS: 5 days  ? ? ?Leighton Ruff. Redmond Pulling, MD, FACS ?General, Bariatric, & Minimally Invasive Surgery ?[phone] ?[facility] ? ?

## 2021-10-18 NOTE — Plan of Care (Signed)
  Problem: Activity: Goal: Risk for activity intolerance will decrease Outcome: Progressing   Problem: Pain Managment: Goal: General experience of comfort will improve Outcome: Progressing   Problem: Safety: Goal: Ability to remain free from injury will improve Outcome: Progressing   

## 2021-10-18 NOTE — Progress Notes (Signed)
?PROGRESS NOTE ?Leah Perkins  UMP:[id number] DOB: 10/05/1955 DOA: 10/13/2021 ?PCP: Howard Pouch A, DO  ? ?Brief Narrative/Hospital Course: ?66 y.o. female with medical history significant for ovarian cancer (s/p TAH b/l SOO and adjuvant chemotherapy completed 03/2012 with no evidence of recurrence at last Gyn/Onc visit 11/13/20), hypertension presented with epigastric pain since 4/3, feeling bloated.  Had several bowel movement orally and noted later with abdominal pain getting more severe she came to the ED.  ?In the ED underwent further labs imaging ED positive nitrite WBC 0-5, chest x-ray no active disease CT chest abdomen pelvis no aneurysm or dissection but mainly small bowel obstruction with single point transition in the mid jejunum within the periumbilical region.Patient was given IV fluids Zofran Dilaudid, general surgery consulted placed NG tube and admitted under hospitalist service.  ? ?Subjective: ?Seen and examined this morning.  Had multiple bowel movement passing flatus, about to order her clear liquid diet.  Blood pressure was high this morning needing IV hydralazine ? ?Assessment and Plan: ?Principal Problem: ?  SBO (small bowel obstruction) ([place]) ?Active Problems: ?  Hypertension ?  Ovarian cancer ?  History of ovarian cancer ? ?SBO in the setting of previous surgery  ?History of ovarian cancer status post bilateral S/P chemo 2013: ?Did not improve on conservative management NG decompression s/p diagnostic laparoscopy with LOA.  Passing flatus clinically much better per [facility] clear liquid diet, ON IVF pain control ambulation.   ? ?Hypokalemia resolved.  Cut down IV fluids ?Hypertension: BP poorly controlled, resume atenolol this morning.  Continue prn IV hydralazine.  ?History of ovarian cancer:S/p TAH b/l SOO and adjuvant chemotherapy and surgical debulking  ?completed 03/2012 with no evidence of recurrence at last Gyn/Onc visit 11/13/20. ? ?DVT prophylaxis: enoxaparin (LOVENOX) injection 40 mg Start:  10/18/21 0800 ?SCDs Start: 10/13/21 2336 ?Code Status:   Code Status: Full Code ?Family Communication: plan of care discussed with patient at bedside. ?Patient status is: Inpatient level of care: Med-Surg  ?Remains inpatient because: Ongoing management of SBO ?Patient currently not stable ? ?Dispo: The patient is from: Home with family ?           Anticipated disposition: Home likely tomorrow.  TRH will continue to follow ? ?Mobility Assessment (last 72 hours)   ? ? Mobility Assessment   ? ? [place] Name 10/16/21 2025 10/16/21 0800 10/16/21 0700 10/15/21 2129 10/15/21 1156  ? Does patient have an order for bedrest or is patient medically unstable No - Continue assessment No - Continue assessment -- No - Continue assessment No - Continue assessment  ? What is the highest level of mobility based on the progressive mobility assessment? Level 5 (Walks with assist in room/hall) - Balance while stepping forward/back and can walk in room with assist - Complete Level 5 (Walks with assist in room/hall) - Balance while stepping forward/back and can walk in room with assist - Complete -- Level 5 (Walks with assist in room/hall) - Balance while stepping forward/back and can walk in room with assist - Complete Level 5 (Walks with assist in room/hall) - Balance while stepping forward/back and can walk in room with assist - Complete  ? ?  ?  ? ?  ?  ? ?Objective: ?Vitals last 24 hrs: ?Vitals:  ? 10/17/21 2130 10/18/21 0131 10/18/21 0559 10/18/21 0948  ?BP: (!) 171/107 (!) 162/94 (!) 188/106 (!) 125/91  ?Pulse: 88 83 89 78  ?Resp: '14 15 14 14  '$ ?Temp: 98.1 ?F (36.7 ?C) 98.4 ?F (36.9 ?  C) 98.3 ?F (36.8 ?C) 98.4 ?F (36.9 ?C)  ?TempSrc: Oral Oral Oral Oral  ?SpO2: 98% 97% 98% 99%  ?Weight:      ?Height:      ? ?Weight change:  ? ?Physical Examination: ?General exam: AA0x3, pleasant, older than stated age, weak appearing. ?HEENT:Oral mucosa moist, Ear/Nose WNL grossly, dentition normal. ?Respiratory system: bilaterally clear,no use of  accessory muscle ?Cardiovascular system: S1 & S2 +, No JVD,. ?Gastrointestinal system: Abdomen soft, mildly tender surgical sites with glue in place, BS+  ?Nervous System:Alert, awake, moving extremities and grossly nonfocal ?Extremities: edema neg,distal peripheral pulses palpable.  ?Skin: No rashes,no icterus. ?MSK: Normal muscle bulk,tone, power ? ? ?Medications reviewed:  ?Scheduled Meds: ? acetaminophen  1,000 mg Oral Q8H  ? atenolol  12.5 mg Oral q morning  ? Chlorhexidine Gluconate Cloth  6 each Topical Daily  ? enoxaparin (LOVENOX) injection  40 mg Subcutaneous Q24H  ? lip balm  1 application. Topical BID  ? ?Continuous Infusions: ? 0.9 % NaCl with KCl 20 mEq / L 100 mL/hr at 10/17/21 1653  ? famotidine (PEPCID) IV 20 mg (10/17/21 1249)  ? methocarbamol (ROBAXIN) IV    ? ondansetron (ZOFRAN) IV    ? ? ?  ?Diet Order   ? ?       ?  Diet clear liquid Room service appropriate? Yes; Fluid consistency: Thin  Diet effective now       ?  ? ?  ?  ? ?  ?  ?Intake/Output Summary (Last 24 hours) at 10/18/2021 1120 ?Last data filed at 10/18/2021 1000 ?Gross per 24 hour  ?Intake 671.26 ml  ?Output 1700 ml  ?Net -1028.74 ml  ? ? ?Net IO Since Admission: 206.73 mL [10/18/21 1120]  ?Wt Readings from Last 3 Encounters:  ?10/16/21 74.8 kg  ?09/09/21 73 kg  ?03/11/21 72.1 kg  ?  ? ?Unresulted Labs (From admission, onward)  ? ? None  ? ?  ?Data Reviewed: I have personally reviewed following labs and imaging studies ?CBC: ?Recent Labs  ?Lab 2021/10/28 ?1401 10/14/21 ?0353 10/18/21 ?1610  ?WBC 6.5 5.5 4.2  ?NEUTROABS  --   --  2.8  ?HGB 13.9 13.5 11.9*  ?HCT 41.7 40.1 35.7*  ?MCV 85.8 88.1 87.1  ?PLT 274 257 217  ? ? ?Basic Metabolic Panel: ?Recent Labs  ?Lab October 28, 2021 ?1815 10/14/21 ?0353 10/15/21 ?9604 10/16/21 ?5409 10/17/21 ?8119 10/18/21 ?0457  ?NA  --  142 140 140 142 141  ?K  --  3.7 3.7 3.2* 3.7 3.6  ?CL  --  107 102 102 104 107  ?CO2  --  30 33* '30 29 27  '$ ?GLUCOSE  --  126* 114* 100* 100* 102*  ?BUN  --  '17 16 19 '$ 24* 18   ?CREATININE  --  0.96 0.82 0.79 0.97 0.71  ?CALCIUM  --  9.4 8.9 8.9 8.6* 8.4*  ?MG 1.8  --  1.9 2.0  --  2.2  ?PHOS 4.0  --   --  4.0  --   --   ? ? ?GFR: ?Estimated Creatinine Clearance: 71.5 mL/min (by C-G formula based on SCr of 0.71 mg/dL). ?Liver Function Tests: ?Recent Labs  ?Lab 10/28/2021 ?1401  ?AST 34  ?ALT 25  ?ALKPHOS 59  ?BILITOT 1.2  ?PROT 7.8  ?ALBUMIN 4.3  ? ? ?Radiology Studies: ?DG Abd Portable 1V ? ?Result Date: 10/17/2021 ?CLINICAL DATA:  Small bowel obstruction EXAM: PORTABLE ABDOMEN - 1 VIEW COMPARISON:  Yesterday FINDINGS: Diffuse fluid-filled small  bowel dilatation with retention of contrast. The enteric tube remains looped at the level of the stomach, which is decompressed. No concerning mass effect or gas collection. IMPRESSION: Persistent small bowel dilatation and contrast retention. Electronically Signed   By: Jorje Guild M.D.   On: 10/17/2021 06:17   ? ? LOS: 5 days  ? ?Antonieta Pert, MD ?[facility] ? ?10/18/2021, 11:20 AM  ?  ?

## 2021-10-19 DIAGNOSIS — K56609 Unspecified intestinal obstruction, unspecified as to partial versus complete obstruction: Secondary | ICD-10-CM | POA: Diagnosis not present

## 2021-10-19 MED ORDER — BOOST / RESOURCE BREEZE PO LIQD CUSTOM
1.0000 | Freq: Two times a day (BID) | ORAL | Status: DC
  Administered 2021-10-19 (×2): 1 via ORAL
  Filled 2021-10-19: qty 1

## 2021-10-19 NOTE — Plan of Care (Signed)
  Problem: Activity: Goal: Risk for activity intolerance will decrease Outcome: Progressing   Problem: Nutrition: Goal: Adequate nutrition will be maintained Outcome: Progressing   Problem: Pain Managment: Goal: General experience of comfort will improve Outcome: Progressing   Problem: Safety: Goal: Ability to remain free from injury will improve Outcome: Progressing   

## 2021-10-19 NOTE — Progress Notes (Signed)
?PROGRESS NOTE ?Leah Perkins  OIN:[id number] DOB: 21-Jan-1956 DOA: 10/13/2021 ?PCP: Howard Pouch A, DO  ? ?Brief Narrative/Hospital Course: ?66 y.o. female with medical history significant for ovarian cancer (s/p TAH b/l SOO and adjuvant chemotherapy completed 03/2012 with no evidence of recurrence at last Gyn/Onc visit 11/13/20), hypertension presented with epigastric pain since 4/3, feeling bloated.  Had several bowel movement orally and noted later with abdominal pain getting more severe she came to the ED.  ?In the ED underwent further labs imaging ED positive nitrite WBC 0-5, chest x-ray no active disease CT chest abdomen pelvis no aneurysm or dissection but mainly small bowel obstruction with single point transition in the mid jejunum within the periumbilical region.Patient was given IV fluids Zofran Dilaudid, general surgery consulted placed NG tube and admitted under hospitalist service.s/p   Laparoscopic LOA 4/7 and having bowel movement since/ 4/8  ? ?Subjective: ?Seen examined this morning.  She has been vomiting and ongoing.  Complains of being bloated and some belching. ? ?Assessment and Plan: ?Principal Problem: ?  SBO (small bowel obstruction) ([place]) ?Active Problems: ?  Hypertension ?  Ovarian cancer ?  History of ovarian cancer ? ?SBO in the setting of previous surgery  ?History of ovarian cancer status post bilateral S/P chemo 2013: ?s/p diagnostic laparoscopy with LOA.having BM 2x/past 24 hrs. Tolerating CLD diet.  Advance diet and disposition as per primary surgical team-and is being monitored one more day. Encourage ambulation. ? ?Hypokalemia resolved. Weaning off ivf ?Hypertension: Was uncontrolled prior to resuming atenolol.  Now stable ?History of ovarian cancer:S/p TAH b/l SOO and adjuvant chemotherapy and surgical debulking  ?completed 03/2012 with no evidence of recurrence at last Gyn/Onc visit 11/13/20. ? ?DVT prophylaxis: enoxaparin (LOVENOX) injection 40 mg Start: 10/18/21 0800 ?SCDs Start:  10/13/21 2336 ?Code Status:   Code Status: Full Code ?Family Communication: plan of care discussed with patient at bedside. ?Patient status is: Inpatient level of care: Med-Surg  ?Remains inpatient because: Ongoing management of SBO ?Patient currently not stable ? ?Dispo: The patient is from: Home with family ?           Anticipated disposition: per [facility] ? ?Mobility Assessment (last 72 hours)   ? ? Mobility Assessment   ? ? [place] Name 10/18/21 2059 10/16/21 2025  ?  ?  ?  ? Does patient have an order for bedrest or is patient medically unstable No - Continue assessment No - Continue assessment     ? What is the highest level of mobility based on the progressive mobility assessment? Level 6 (Walks independently in room and hall) - Balance while walking in room without assist - Complete Level 5 (Walks with assist in room/hall) - Balance while stepping forward/back and can walk in room with assist - Complete     ? ?  ?  ? ?  ?  ? ?Objective: ?Vitals last 24 hrs: ?Vitals:  ? 10/18/21 0948 10/18/21 1413 10/18/21 2107 10/19/21 0525  ?BP: (!) 125/91 126/80 (!) 135/92 (!) 147/96  ?Pulse: 78 67 64 64  ?Resp: '14 14 16 16  '$ ?Temp: 98.4 ?F (36.9 ?C) 98.6 ?F (37 ?C) 97.9 ?F (36.6 ?C) 97.6 ?F (36.4 ?C)  ?TempSrc: Oral Oral Oral Oral  ?SpO2: 99% 97% 98% 98%  ?Weight:    74 kg  ?Height:      ? ?Weight change:  ? ?Physical Examination: ?General exam: AA0x3, older than stated age, weak appearing. ?HEENT:Oral mucosa moist, Ear/Nose WNL grossly, dentition normal. ?Respiratory system: bilaterally diminished,  no use of accessory muscle ?Cardiovascular system: S1 & S2 +, No JVD,. ?Gastrointestinal system: Abdomen exam deferred  ?Nervous System:Alert, awake, moving extremities and grossly nonfocal ?Extremities:no edema, distal peripheral pulses palpable.  ?Skin: No rashes,no icterus. ?MSK: Normal muscle bulk,tone, power  ? ?Medications reviewed:  ?Scheduled Meds: ? acetaminophen  1,000 mg Oral Q8H  ? atenolol  12.5 mg Oral q morning  ?  Chlorhexidine Gluconate Cloth  6 each Topical Daily  ? enoxaparin (LOVENOX) injection  40 mg Subcutaneous Q24H  ? feeding supplement  1 Container Oral BID BM  ? lip balm  1 application. Topical BID  ? ?Continuous Infusions: ? 0.9 % NaCl with KCl 20 mEq / L 50 mL/hr at 10/18/21 2230  ? famotidine (PEPCID) IV 20 mg (10/18/21 1201)  ? methocarbamol (ROBAXIN) IV    ? ondansetron (ZOFRAN) IV    ? ? ?  ?Diet Order   ? ?       ?  Diet full liquid Room service appropriate? Yes; Fluid consistency: Thin  Diet effective now       ?  ? ?  ?  ? ?  ?  ?Intake/Output Summary (Last 24 hours) at 10/19/2021 1039 ?Last data filed at 10/19/2021 0830 ?Gross per 24 hour  ?Intake 2539.98 ml  ?Output 200 ml  ?Net 2339.98 ml  ? ?Net IO Since Admission: 2,546.71 mL [10/19/21 1039]  ?Wt Readings from Last 3 Encounters:  ?10/19/21 74 kg  ?09/09/21 73 kg  ?03/11/21 72.1 kg  ?  ? ?Unresulted Labs (From admission, onward)  ? ? None  ? ?  ?Data Reviewed: I have personally reviewed following labs and imaging studies ?CBC: ?Recent Labs  ?Lab 11-07-2021 ?1401 10/14/21 ?0353 10/18/21 ?0321  ?WBC 6.5 5.5 4.2  ?NEUTROABS  --   --  2.8  ?HGB 13.9 13.5 11.9*  ?HCT 41.7 40.1 35.7*  ?MCV 85.8 88.1 87.1  ?PLT 274 257 217  ? ?Basic Metabolic Panel: ?Recent Labs  ?Lab 11-07-21 ?1815 10/14/21 ?0353 10/15/21 ?2248 10/16/21 ?2500 10/17/21 ?3704 10/18/21 ?0457  ?NA  --  142 140 140 142 141  ?K  --  3.7 3.7 3.2* 3.7 3.6  ?CL  --  107 102 102 104 107  ?CO2  --  30 33* '30 29 27  '$ ?GLUCOSE  --  126* 114* 100* 100* 102*  ?BUN  --  '17 16 19 '$ 24* 18  ?CREATININE  --  0.96 0.82 0.79 0.97 0.71  ?CALCIUM  --  9.4 8.9 8.9 8.6* 8.4*  ?MG 1.8  --  1.9 2.0  --  2.2  ?PHOS 4.0  --   --  4.0  --   --   ? ?GFR: ?Estimated Creatinine Clearance: 71.2 mL/min (by C-G formula based on SCr of 0.71 mg/dL). ?Liver Function Tests: ?Recent Labs  ?Lab 11-07-2021 ?1401  ?AST 34  ?ALT 25  ?ALKPHOS 59  ?BILITOT 1.2  ?PROT 7.8  ?ALBUMIN 4.3  ? ?Radiology Studies: ?No results found. ? ? LOS: 6 days   ? ?Antonieta Pert, MD ?[facility] ? ?10/19/2021, 10:39 AM  ?  ?

## 2021-10-19 NOTE — Progress Notes (Addendum)
Patient ID: Leah Perkins, female   DOB: 05-Feb-1956, 66 y.o.   MRN: [id number] ? ? ?Acute Care Surgery Service Progress Note:   ? ?Chief Complaint/Subjective: ?HTN better ?Had small BM ?Tiny amounts of nausea when having BM ?More burping than flatus ?Feels a little bloated/distended still ? ?Objective: ?Vital signs in last 24 hours: ?Temp:  [97.6 ?F (36.4 ?C)-98.6 ?F (37 ?C)] 97.6 ?F (36.4 ?C) (04/09 0525) ?Pulse Rate:  [64-78] 64 (04/09 0525) ?Resp:  [14-16] 16 (04/09 0525) ?BP: (125-147)/(80-96) 147/96 (04/09 0525) ?SpO2:  [97 %-99 %] 98 % (04/09 0525) ?Weight:  [74 kg] 74 kg (04/09 0525) ?Last BM Date : 10/18/21 ? ?Intake/Output from previous day: ?04/08 0701 - 04/09 0700 ?In: 2540 [P.O.:1200; I.V.:1290; IV Piggyback:50] ?Out: 200 [Urine:200] ?Intake/Output this shift: ?No intake/output data recorded. ? ?Lungs: cta, nonlabored ? ?Cardiovascular: reg ? ?Abd: soft, min distension and min TTP; incisions ok ? ?Extremities: no edema, +SCDs ? ?Neuro: alert, nonfocal ? ?Lab Results: ?CBC  ?Recent Labs  ?  10/18/21 ?0457  ?WBC 4.2  ?HGB 11.9*  ?HCT 35.7*  ?PLT 217  ? ? ?BMET ?Recent Labs  ?  10/17/21 ?0412 10/18/21 ?1601  ?NA 142 141  ?K 3.7 3.6  ?CL 104 107  ?CO2 29 27  ?GLUCOSE 100* 102*  ?BUN 24* 18  ?CREATININE 0.97 0.71  ?CALCIUM 8.6* 8.4*  ? ? ?LFT ? ?  Latest Ref Rng & Units 10/13/2021  ?  2:01 PM 03/11/2021  ?  9:29 AM 02/13/2019  ? 11:35 AM  ?Hepatic Function  ?Total Protein 6.5 - 8.1 g/dL 7.8   6.7   6.7    ?Albumin 3.5 - 5.0 g/dL 4.3   4.1   4.2    ?AST 15 - 41 U/L 34   24   22    ?ALT 0 - 44 U/L $Remo'25   19   19    'OEwVb$ ?Alk Phosphatase 38 - 126 U/L 59   51   54    ?Total Bilirubin 0.3 - 1.2 mg/dL 1.2   1.1   0.8    ?Bilirubin, Direct 0.0 - 0.2 mg/dL 0.2      ? ?PT/INR ?No results for input(s): LABPROT, INR in the last 72 hours. ?ABG ?No results for input(s): PHART, HCO3 in the last 72 hours. ? ?Invalid input(s): PCO2, PO2 ? ?Studies/Results: ? ?Anti-infectives: ?Anti-infectives (From admission, onward)  ? ? Start      Dose/Rate Route Frequency Ordered Stop  ? 10/17/21 0845  ceFAZolin (ANCEF) IVPB 1 g/50 mL premix  Status:  Discontinued       ? 1 g ?100 mL/hr over 30 Minutes Intravenous Every 8 hours 10/17/21 0746 10/17/21 1143  ? 10/17/21 0841  ceFAZolin (ANCEF) 2-4 GM/100ML-% IVPB       ?Note to Pharmacy: Abelino Derrick: cabinet override  ?    10/17/21 0841 10/17/21 1031  ? ?  ? ? ?Medications: ?Scheduled Meds: ? acetaminophen  1,000 mg Oral Q8H  ? atenolol  12.5 mg Oral q morning  ? Chlorhexidine Gluconate Cloth  6 each Topical Daily  ? enoxaparin (LOVENOX) injection  40 mg Subcutaneous Q24H  ? lip balm  1 application. Topical BID  ? ?Continuous Infusions: ? 0.9 % NaCl with KCl 20 mEq / L 50 mL/hr at 10/18/21 2230  ? famotidine (PEPCID) IV 20 mg (10/18/21 1201)  ? methocarbamol (ROBAXIN) IV    ? ondansetron (ZOFRAN) IV    ? ?PRN Meds:.hydrALAZINE, HYDROmorphone (DILAUDID) injection, magic mouthwash,  menthol-cetylpyridinium, methocarbamol (ROBAXIN) IV, ondansetron (ZOFRAN) IV **OR** ondansetron (ZOFRAN) IV, phenol, prochlorperazine, simethicone, traMADol ? ?Assessment/Plan: ?Patient Active Problem List  ? Diagnosis Date Noted  ? SBO (small bowel obstruction) ([place]) 10/13/2021  ? History of ovarian cancer 10/13/2021  ? Hypertension 02/13/2019  ? Hyperlipidemia 02/13/2019  ? Genetic testing 12/16/2017  ? Family history of lymphoma   ? Sternoclavicular joint strain 03/07/2014  ? BRCA negative 09/27/2013  ? Ovarian cancer 09/22/2012  ? Family history of cardiomyopathy 08/05/2012  ? ?s/p Procedure(s): ?LAPAROSCOPY DIAGNOSTIC ?LAPAROSCOPIC LYSIS OF ADHESIONS 10/17/2021 Dr Redmond Pulling ? ?Adv to FLD; add shakes ?Cont chemical vte prophylaxis ?HTN per [facility] ?Foley out 4/8 ? ?Disposition: FLD today, not ready for dc today ? LOS: 6 days  ? ? ?Leighton Ruff. Redmond Pulling, MD, FACS ?General, Bariatric, & Minimally Invasive Surgery ?[phone] ?[facility] ? ?

## 2021-10-20 ENCOUNTER — Encounter (HOSPITAL_COMMUNITY): Payer: Self-pay | Admitting: General Surgery

## 2021-10-20 DIAGNOSIS — K56609 Unspecified intestinal obstruction, unspecified as to partial versus complete obstruction: Secondary | ICD-10-CM | POA: Diagnosis not present

## 2021-10-20 NOTE — Discharge Summary (Signed)
[facility] ?Discharge Summary  ? ?Patient ID: ?Leah Perkins ?MRN: [id number] ?DOB/AGE: May 30, 1956 66 y.o. ? ?Admit date: 10/13/2021 ?Discharge date: 10/20/2021 ? ?Admitting Diagnosis: ?SBO ? ?Discharge Diagnosis ?Patient Active Problem List  ? Diagnosis Date Noted  ? SBO (small bowel obstruction) ([place]) 10/13/2021  ? History of ovarian cancer 10/13/2021  ? Hypertension 02/13/2019  ? Hyperlipidemia 02/13/2019  ? Genetic testing 12/16/2017  ? Family history of lymphoma   ? Sternoclavicular joint strain 03/07/2014  ? BRCA negative 09/27/2013  ? Ovarian cancer 09/22/2012  ? Family history of cardiomyopathy 08/05/2012  ? ? ?Consultants ?Hospitalist  ? ? ?Procedures ?Dr. Greer Pickerel 10/17/21 diagnostic laparoscopy, lysis of adhesions  ? ?Hospital Course:  ?66 y/o F with PMH HTN and ovaria cancer s/p TAH/BSO and chemo who presented to [facility] ED 4/3 due to epigastric pain, nausea, vomiting. Workup significant for SBO. She was admitted and failed attempted non-op mgmt with SBO protocol. She underwent the above operation by Dr. Redmond Pulling. Tolerated procedure well and was transferred to the floor.  Diet was advanced as tolerated.  On POD#3, the patient was voiding well, tolerating diet, ambulating well, pain well controlled, vital signs stable, incisions c/d/i and felt stable for discharge home.  Patient will follow up in our office as below and knows to call with questions or concerns.   ? ?Physical Exam: ?General:  Alert, NAD, pleasant, comfortable ?Abd:  Soft, mild distention, mild tenderness, incisions C/D/I, +BS  ? ?Allergies as of 10/20/2021   ? ?   Reactions  ? Lisinopril Other (See Comments)  ? Decreased renal fx while on- resolved when dc'd  ? Sulfa Antibiotics Other (See Comments)  ? Unknown childhood reaction  ? ?  ? ?  ?Medication List  ?  ? ?TAKE these medications   ? ?acetaminophen 500 MG tablet ?Commonly known as: TYLENOL ?Take 1,000 mg by mouth daily as needed for headache (pain). ?  ?atenolol 25 MG  tablet ?Commonly known as: TENORMIN ?Take 0.5 tablets (12.5 mg total) by mouth daily. ?What changed: when to take this ?  ?CALCIUM 600 + D PO ?Take 1 tablet by mouth in the morning and at bedtime. ?  ?famotidine 20 MG tablet ?Commonly known as: PEPCID ?Take 20 mg by mouth once. ?  ?ibuprofen 200 MG tablet ?Commonly known as: ADVIL ?Take 400 mg by mouth daily as needed for headache (pain). ?  ?multivitamin with minerals tablet ?Take 1 tablet by mouth at bedtime. ?  ?vitamin C 500 MG tablet ?Commonly known as: ASCORBIC ACID ?Take 500 mg by mouth 2 (two) times daily. ?  ? ?  ? ? ? ? Follow-up Information   ? ? Greer Pickerel, MD. Schedule an appointment as soon as possible for a visit in 4 week(s).   ?Specialty: General Surgery ?Why: For post-operative follow up ?Contact information: ?[address] ?[address] ?[place] [address] ?[phone] ? ? ?  ?  ? ?  ?  ? ?  ? ? ?Signed: ?Obie Dredge, PA-C ?[facility] ?10/20/2021, 9:32 AM ? ?

## 2021-10-20 NOTE — Discharge Instructions (Addendum)
Low fiber diet for 1-2 weeks then may resume normal diet as you tolerate ? ?[facility] ?LAPAROSCOPIC SURGERY: POST OP INSTRUCTIONS ?Always review your discharge instruction sheet given to you by the facility where your surgery was performed. ?IF YOU HAVE DISABILITY OR FAMILY LEAVE FORMS, YOU MUST BRING THEM TO THE OFFICE FOR PROCESSING.   ?DO NOT GIVE THEM TO YOUR DOCTOR. ? ?PAIN CONTROL ? ?First take acetaminophen (Tylenol) AND/or ibuprofen (Advil) to control your pain after surgery.  Follow directions on package.  Taking acetaminophen (Tylenol) and/or ibuprofen (Advil) regularly after surgery will help to control your pain and lower the amount of prescription pain medication you may need.  You should not take more than 3,000 mg (3 grams) of acetaminophen (Tylenol) in 24 hours.  You should not take ibuprofen (Advil), aleve, motrin, naprosyn or other NSAIDS if you have a history of stomach ulcers or chronic kidney disease.  ?A prescription for pain medication may be given to you upon discharge.  Take your pain medication as prescribed, if you still have uncontrolled pain after taking acetaminophen (Tylenol) or ibuprofen (Advil). ?Use ice packs to help control pain. ?If you need a refill on your pain medication, please contact your pharmacy.  They will contact our office to request authorization. Prescriptions will not be filled after 5pm or on week-ends. ? ?HOME MEDICATIONS ?Take your usually prescribed medications unless otherwise directed. ? ?DIET ?You should follow a light diet the first few days after arrival home.  Be sure to include lots of fluids daily. Avoid fatty, fried foods.  ? ?CONSTIPATION ?It is common to experience some constipation after surgery and if you are taking pain medication.  Increasing fluid intake and taking a stool softener (such as Colace) will usually help or prevent this problem from occurring.  A mild laxative (Milk of Magnesia or Miralax) should be taken  according to package instructions if there are no bowel movements after 48 hours. ? ?WOUND/INCISION CARE ?Most patients will experience some swelling and bruising in the area of the incisions.  Ice packs will help.  Swelling and bruising can take several days to resolve.  ?Unless discharge instructions indicate otherwise, follow guidelines below  ?STERI-STRIPS - you may remove your outer bandages 48 hours after surgery, and you may shower at that time.  You have steri-strips (small skin tapes) in place directly over the incision.  These strips should be left on the skin for 7-10 days.   ?DERMABOND/SKIN GLUE - you may shower in 24 hours.  The glue will flake off over the next 2-3 weeks. ?Any sutures or staples will be removed at the office during your follow-up visit. ? ?ACTIVITIES ?You may resume regular (light) daily activities beginning the next day--such as daily self-care, walking, climbing stairs--gradually increasing activities as tolerated.  You may have sexual intercourse when it is comfortable.  Refrain from any heavy lifting or straining until approved by your doctor. ?You may drive when you are no longer taking prescription pain medication, you can comfortably wear a seatbelt, and you can safely maneuver your car and apply brakes. ? ?FOLLOW-UP ?You should see your doctor in the office for a follow-up appointment approximately 2-3 weeks after your surgery.  You should have been given your post-op/follow-up appointment when your surgery was scheduled.  If you did not receive a post-op/follow-up appointment, make sure that you call for this appointment within a day or two after you arrive home to insure a convenient appointment time. ? ? ?  WHEN TO CALL YOUR DOCTOR: ?Fever over 101.0 ?Inability to urinate ?Continued bleeding from incision. ?Increased pain, redness, or drainage from the incision. ?Increasing abdominal pain ? ?The clinic staff is available to answer your questions during regular business hours.   Please don?t hesitate to call and ask to speak to one of the nurses for clinical concerns.  If you have a medical emergency, go to the nearest emergency room or call 911.  A surgeon from [facility] is always on call at the hospital. ?[address] [place], [place], [place]  [address] ? [address], [place], [place]   [address] ?[phone] ? [phone] ? FAX [phone] ?Web site: [email or web address]  ?

## 2021-10-20 NOTE — Anesthesia Postprocedure Evaluation (Signed)
Anesthesia Post Note ? ?Patient: Leah Perkins ? ?Procedure(s) Performed: LAPAROSCOPY DIAGNOSTIC (Abdomen) ?LAPAROSCOPIC LYSIS OF ADHESIONS (Abdomen) ? ?  ? ?Patient location during evaluation: PACU ?Anesthesia Type: General ?Level of consciousness: awake and alert ?Pain management: pain level controlled ?Vital Signs Assessment: post-procedure vital signs reviewed and stable ?Respiratory status: spontaneous breathing, nonlabored ventilation and respiratory function stable ?Cardiovascular status: stable and blood pressure returned to baseline ?Anesthetic complications: no ? ? ?No notable events documented. ? ?Last Vitals:  ?Vitals:  ? 10/19/21 2124 10/20/21 0535  ?BP: (!) 153/94 (!) 141/90  ?Pulse: 69 65  ?Resp: 16 16  ?Temp: 36.8 ?C 36.8 ?C  ?SpO2: 100% 99%  ?  ?Last Pain:  ?Vitals:  ? 10/20/21 0535  ?TempSrc: Oral  ?PainSc:   ? ? ?  ?  ?  ?  ?  ?  ? ?[name] ? ? ? ? ?

## 2021-10-20 NOTE — Progress Notes (Signed)
?PROGRESS NOTE ?Leah Perkins  NOM:[id number] DOB: 1955/12/02 DOA: 10/13/2021 ?PCP: Howard Pouch A, DO  ? ?Brief Narrative/Hospital Course: ?66 y.o. female with medical history significant for ovarian cancer (s/p TAH b/l SOO and adjuvant chemotherapy completed 03/2012 with no evidence of recurrence at last Gyn/Onc visit 11/13/20), hypertension presented with epigastric pain since 4/3, feeling bloated.  Had several bowel movement orally and noted later with abdominal pain getting more severe she came to the ED.  ?In the ED underwent further labs imaging ED positive nitrite WBC 0-5, chest x-ray no active disease CT chest abdomen pelvis no aneurysm or dissection but mainly small bowel obstruction with single point transition in the mid jejunum within the periumbilical region.Patient was given IV fluids Zofran Dilaudid, general surgery consulted placed NG tube and admitted under hospitalist service.s/p   Laparoscopic LOA 4/7 and having bowel movement since/ 4/8  ? ?Subjective: ?Seen and examined.  Having soft bowel movement still mildly bloated, going to try soft diet this afternoon and likely home  ? ?Assessment and Plan: ?Principal Problem: ?  SBO (small bowel obstruction) ([place]) ?Active Problems: ?  Hypertension ?  Ovarian cancer ?  History of ovarian cancer ? ?SBO in the setting of previous surgery  ?History of ovarian cancer status post bilateral S/P chemo 2013: ?s/p diagnostic laparoscopy with LOA,tolerating diet so far, having BM.  Anticipate discharge later today if tolerating diet.  Continue plan per surgery.  ?Hypokalemia resolved.  Discontinu ivf ?Hypertension: Was uncontrolled prior to resuming atenolol.  Stable on home atenolol continue ?History of ovarian cancer:S/p TAH b/l SOO and adjuvant chemotherapy and surgical debulking completed 03/2012 with no evidence of recurrence at last Gyn/Onc visit 11/13/20. ? ?DVT prophylaxis: enoxaparin (LOVENOX) injection 40 mg Start: 10/18/21 0800 ?SCDs Start: 10/13/21 2336 ?Code  Status:   Code Status: Full Code ?Family Communication: plan of care discussed with patient at bedside. ?Patient status is: Inpatient level of care: Med-Surg  ?Remains inpatient because: Ongoing management of SBO ?Patient currently not stable ? ?Dispo: The patient is from: Home with family ?           Anticipated disposition: per [facility] likely home today ? ?Mobility Assessment (last 72 hours)   ? ? Mobility Assessment   ? ? [place] Name 10/19/21 2038 10/18/21 2059  ?  ?  ?  ? Does patient have an order for bedrest or is patient medically unstable No - Continue assessment No - Continue assessment     ? What is the highest level of mobility based on the progressive mobility assessment? Level 6 (Walks independently in room and hall) - Balance while walking in room without assist - Complete Level 6 (Walks independently in room and hall) - Balance while walking in room without assist - Complete     ? ?  ?  ? ?  ?  ? ?Objective: ?Vitals last 24 hrs: ?Vitals:  ? 10/19/21 1350 10/19/21 2124 10/20/21 0535 10/20/21 0540  ?BP: (!) 143/97 (!) 153/94 (!) 141/90   ?Pulse: 70 69 65   ?Resp: '16 16 16   '$ ?Temp: 97.8 ?F (36.6 ?C) 98.2 ?F (36.8 ?C) 98.2 ?F (36.8 ?C)   ?TempSrc: Oral Oral Oral   ?SpO2: 98% 100% 99%   ?Weight:    73 kg  ?Height:      ? ?Weight change: -1 kg ? ?Physical Examination: ?General exam: AA OX3,older than stated age, weak appearing. ?HEENT:Oral mucosa moist, Ear/Nose WNL grossly, dentition normal. ?Respiratory system: bilaterally clear,no use of accessory muscle ?  Cardiovascular system: S1 & S2 +, No JVD,. ?Gastrointestinal system: Abdomen soft, mildly distended with mild tenderness surgical site glued  ?Nervous System:Alert, awake, moving extremities and grossly nonfocal ?Extremities: edema neg,distal peripheral pulses palpable.  ?Skin: No rashes,no icterus. ?MSK: Normal muscle bulk,tone, power ? ? ?Medications reviewed:  ?Scheduled Meds: ? acetaminophen  1,000 mg Oral Q8H  ? atenolol  12.5 mg Oral q morning  ?  Chlorhexidine Gluconate Cloth  6 each Topical Daily  ? enoxaparin (LOVENOX) injection  40 mg Subcutaneous Q24H  ? feeding supplement  1 Container Oral BID BM  ? lip balm  1 application. Topical BID  ? ?Continuous Infusions: ? famotidine (PEPCID) IV 20 mg (10/19/21 1118)  ? methocarbamol (ROBAXIN) IV    ? ondansetron (ZOFRAN) IV    ? ? ?  ?Diet Order   ? ?       ?  DIET SOFT Room service appropriate? Yes; Fluid consistency: Thin  Diet effective now       ?  ? ?  ?  ? ?  ?  ?Intake/Output Summary (Last 24 hours) at 10/20/2021 0908 ?Last data filed at 10/20/2021 0748 ?Gross per 24 hour  ?Intake 2461.79 ml  ?Output --  ?Net 2461.79 ml  ? ? ?Net IO Since Admission: 5,008.5 mL [10/20/21 0908]  ?Wt Readings from Last 3 Encounters:  ?10/20/21 73 kg  ?09/09/21 73 kg  ?03/11/21 72.1 kg  ?  ? ?Unresulted Labs (From admission, onward)  ? ? None  ? ?  ?Data Reviewed: I have personally reviewed following labs and imaging studies ?CBC: ?Recent Labs  ?Lab October 28, 2021 ?1401 10/14/21 ?0353 10/18/21 ?3825  ?WBC 6.5 5.5 4.2  ?NEUTROABS  --   --  2.8  ?HGB 13.9 13.5 11.9*  ?HCT 41.7 40.1 35.7*  ?MCV 85.8 88.1 87.1  ?PLT 274 257 217  ? ? ?Basic Metabolic Panel: ?Recent Labs  ?Lab 2021/10/28 ?1815 10/14/21 ?0353 10/15/21 ?0539 10/16/21 ?7673 10/17/21 ?4193 10/18/21 ?0457  ?NA  --  142 140 140 142 141  ?K  --  3.7 3.7 3.2* 3.7 3.6  ?CL  --  107 102 102 104 107  ?CO2  --  30 33* '30 29 27  '$ ?GLUCOSE  --  126* 114* 100* 100* 102*  ?BUN  --  '17 16 19 '$ 24* 18  ?CREATININE  --  0.96 0.82 0.79 0.97 0.71  ?CALCIUM  --  9.4 8.9 8.9 8.6* 8.4*  ?MG 1.8  --  1.9 2.0  --  2.2  ?PHOS 4.0  --   --  4.0  --   --   ? ? ?GFR: ?Estimated Creatinine Clearance: 70.8 mL/min (by C-G formula based on SCr of 0.71 mg/dL). ?Liver Function Tests: ?Recent Labs  ?Lab 28-Oct-2021 ?1401  ?AST 34  ?ALT 25  ?ALKPHOS 59  ?BILITOT 1.2  ?PROT 7.8  ?ALBUMIN 4.3  ? ? ?Radiology Studies: ?No results found. ? ? LOS: 7 days  ? ?Antonieta Pert, MD ?[facility] ? ?10/20/2021, 9:08 AM  ?  ?

## 2021-10-20 NOTE — Plan of Care (Signed)
Pt doing well and ready to DC home with husband. ?

## 2021-10-22 ENCOUNTER — Telehealth: Payer: Self-pay

## 2021-10-22 NOTE — Telephone Encounter (Signed)
Transition Care Management Follow-up Telephone Call ?Date of discharge and from where: 10/20/21, [facility] ?How have you been since you were released from the hospital? good ?Any questions or concerns? No ? ?Items Reviewed: ?Did the pt receive and understand the discharge instructions provided? Yes  ?Medications obtained and verified? Yes  ?Other? No  ?Any new allergies since your discharge? No  ?Dietary orders reviewed? Yes ?Do you have support at home? Yes  ? ?Home Care and Equipment/Supplies: ?Were home health services ordered? not applicable ?If so, what is the name of the agency?   ?Has the agency set up a time to come to the patient's home? not applicable ?Were any new equipment or medical supplies ordered?  No ?What is the name of the medical supply agency?  ?Were you able to get the supplies/equipment? not applicable ?Do you have any questions related to the use of the equipment or supplies? No ? ?Functional Questionnaire: (I = Independent and D = Dependent) ?ADLs: I ? ?Bathing/Dressing- I ? ?Meal Prep- I ? ?Eating- I ? ?Maintaining continence- I ? ?Transferring/Ambulation- I ? ?Managing Meds- I ? ?Follow up appointments reviewed: ? ?PCP Hospital f/u appt confirmed? No   ?Specialist Hospital f/u appt confirmed? Yes  pt stated will follow up with surgery  ?Are transportation arrangements needed? No  ?If their condition worsens, is the pt aware to call PCP or go to the Emergency Dept.? Yes ?Was the patient provided with contact information for the PCP's office or ED? Yes ?Was to pt encouraged to call back with questions or concerns? Yes ? ?

## 2021-11-10 ENCOUNTER — Other Ambulatory Visit: Payer: Self-pay | Admitting: *Deleted

## 2021-11-10 DIAGNOSIS — C569 Malignant neoplasm of unspecified ovary: Secondary | ICD-10-CM

## 2021-11-27 ENCOUNTER — Encounter: Payer: Self-pay | Admitting: Obstetrics & Gynecology

## 2021-12-03 ENCOUNTER — Other Ambulatory Visit: Payer: Self-pay

## 2021-12-03 ENCOUNTER — Inpatient Hospital Stay: Payer: Medicare HMO

## 2021-12-03 ENCOUNTER — Inpatient Hospital Stay: Payer: Medicare HMO | Attending: Obstetrics & Gynecology | Admitting: Obstetrics & Gynecology

## 2021-12-03 ENCOUNTER — Encounter: Payer: Self-pay | Admitting: Obstetrics & Gynecology

## 2021-12-03 VITALS — BP 152/98 | HR 60 | Temp 98.4°F | Resp 20 | Ht 66.0 in | Wt 154.3 lb

## 2021-12-03 DIAGNOSIS — Z8543 Personal history of malignant neoplasm of ovary: Secondary | ICD-10-CM | POA: Insufficient documentation

## 2021-12-03 DIAGNOSIS — C569 Malignant neoplasm of unspecified ovary: Secondary | ICD-10-CM

## 2021-12-03 NOTE — Assessment & Plan Note (Addendum)
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  No evidence of recurrence   >Return prn or in 1 yr

## 2021-12-03 NOTE — Progress Notes (Signed)
Follow Up Note: Gyn-Onc  Leah Perkins 66 y.o. female  CC: She presents for a f/u visit   HPI: The oncology history was reviewed.  Interval History: In 4/23 she presented w/an SBO that required a diagnostic lap w/ LOA. She denies abdominal distention, pain, weight loss or change in her bowel habits.      Review of Systems  Review of Systems  Constitutional:  Negative for malaise/fatigue and weight loss.  Respiratory:  Negative for shortness of breath and wheezing.   Cardiovascular:  Negative for chest pain and leg swelling.  Gastrointestinal:  Negative for abdominal pain, blood in stool, constipation, nausea and vomiting.  Genitourinary:  Negative for dysuria, frequency, hematuria and urgency.  Musculoskeletal:  Negative for joint pain and myalgias.  Neurological:  Negative for weakness.  Psychiatric/Behavioral:  Negative for depression. The patient does not have insomnia.    Current medications, allergy, social history, past surgical history, past medical history, family history were all reviewed.    Vitals:  BP (!) 152/98 (BP Location: Right Arm, Patient Position: Sitting)   Pulse 60   Temp 98.4 F (36.9 C) (Oral)   Resp 20   Ht '5\' 6"'$  (1.676 m)   Wt 154 lb 4.8 oz (70 kg)   LMP  (LMP Unknown)   SpO2 100%   BMI 24.90 kg/m    Physical Exam:  Physical Exam Exam conducted with a chaperone present.  Constitutional:      General: She is not in acute distress. Cardiovascular:     Rate and Rhythm: Normal rate and regular rhythm.  Pulmonary:     Effort: Pulmonary effort is normal.     Breath sounds: Normal breath sounds. No wheezing or rhonchi.  Abdominal:     Palpations: Abdomen is soft.     Tenderness: There is no abdominal tenderness. There is no right CVA tenderness or left CVA tenderness.     Hernia: No hernia is present.  Genitourinary:    General: Normal vulva.     Urethra: No urethral lesion.     Vagina: No lesions. No bleeding Musculoskeletal:     Cervical  back: Neck supple.     Right lower leg: No edema.     Left lower leg: No edema.  Lymphadenopathy:     Upper Body:     Right upper body: No supraclavicular adenopathy.     Left upper body: No supraclavicular adenopathy.     Lower Body: No right inguinal adenopathy. No left inguinal adenopathy.  Skin:    Findings: No rash.  Neurological:     Mental Status: She is oriented to person, place, and time.   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  No evidence of recurrence    >Return prn or in 1 yr   Lahoma Crocker, MD

## 2021-12-03 NOTE — Patient Instructions (Signed)
Return in 1 year ?

## 2021-12-04 LAB — CA 125: Cancer Antigen (CA) 125: 14.4 U/mL (ref 0.0–38.1)

## 2022-03-12 ENCOUNTER — Ambulatory Visit (INDEPENDENT_AMBULATORY_CARE_PROVIDER_SITE_OTHER): Payer: Medicare HMO | Admitting: Family Medicine

## 2022-03-12 ENCOUNTER — Encounter: Payer: Self-pay | Admitting: Family Medicine

## 2022-03-12 VITALS — BP 133/86 | HR 57 | Temp 97.6°F | Ht 66.0 in | Wt 152.0 lb

## 2022-03-12 DIAGNOSIS — Z0000 Encounter for general adult medical examination without abnormal findings: Secondary | ICD-10-CM

## 2022-03-12 DIAGNOSIS — I10 Essential (primary) hypertension: Secondary | ICD-10-CM

## 2022-03-12 DIAGNOSIS — Z79899 Other long term (current) drug therapy: Secondary | ICD-10-CM

## 2022-03-12 DIAGNOSIS — R591 Generalized enlarged lymph nodes: Secondary | ICD-10-CM | POA: Diagnosis not present

## 2022-03-12 DIAGNOSIS — Z8249 Family history of ischemic heart disease and other diseases of the circulatory system: Secondary | ICD-10-CM

## 2022-03-12 DIAGNOSIS — Z1231 Encounter for screening mammogram for malignant neoplasm of breast: Secondary | ICD-10-CM

## 2022-03-12 DIAGNOSIS — C569 Malignant neoplasm of unspecified ovary: Secondary | ICD-10-CM | POA: Diagnosis not present

## 2022-03-12 DIAGNOSIS — Z807 Family history of other malignant neoplasms of lymphoid, hematopoietic and related tissues: Secondary | ICD-10-CM

## 2022-03-12 DIAGNOSIS — E782 Mixed hyperlipidemia: Secondary | ICD-10-CM | POA: Diagnosis not present

## 2022-03-12 DIAGNOSIS — Z1371 Encounter for nonprocreative screening for genetic disease carrier status: Secondary | ICD-10-CM | POA: Diagnosis not present

## 2022-03-12 LAB — COMPREHENSIVE METABOLIC PANEL
ALT: 29 U/L (ref 0–35)
AST: 30 U/L (ref 0–37)
Albumin: 4.1 g/dL (ref 3.5–5.2)
Alkaline Phosphatase: 54 U/L (ref 39–117)
BUN: 20 mg/dL (ref 6–23)
CO2: 31 mEq/L (ref 19–32)
Calcium: 9.8 mg/dL (ref 8.4–10.5)
Chloride: 104 mEq/L (ref 96–112)
Creatinine, Ser: 1.01 mg/dL (ref 0.40–1.20)
GFR: 57.9 mL/min — ABNORMAL LOW (ref >60.00)
Glucose, Bld: 88 mg/dL (ref 70–99)
Potassium: 4.7 mEq/L (ref 3.5–5.1)
Sodium: 142 mEq/L (ref 135–145)
Total Bilirubin: 1 mg/dL (ref 0.2–1.2)
Total Protein: 6.8 g/dL (ref 6.0–8.3)

## 2022-03-12 LAB — LIPID PANEL
Cholesterol: 215 mg/dL — ABNORMAL HIGH (ref 0–200)
HDL: 82.9 mg/dL (ref >39.00)
LDL Cholesterol: 123 mg/dL — ABNORMAL HIGH (ref 0–99)
NonHDL: 131.81
Total CHOL/HDL Ratio: 3
Triglycerides: 44 mg/dL (ref 0.0–149.0)
VLDL: 8.8 mg/dL (ref 0.0–40.0)

## 2022-03-12 LAB — VITAMIN D 25 HYDROXY (VIT D DEFICIENCY, FRACTURES): VITD: 58.18 ng/mL (ref 30.00–100.00)

## 2022-03-12 LAB — CBC WITH DIFFERENTIAL/PLATELET
Basophils Absolute: 0 10*3/uL (ref 0.0–0.1)
Basophils Relative: 1.2 % (ref 0.0–3.0)
Eosinophils Absolute: 0.2 10*3/uL (ref 0.0–0.7)
Eosinophils Relative: 6 % — ABNORMAL HIGH (ref 0.0–5.0)
HCT: 37.1 % (ref 36.0–46.0)
Hemoglobin: 12.5 g/dL (ref 12.0–15.0)
Lymphocytes Relative: 37.5 % (ref 12.0–46.0)
Lymphs Abs: 1.1 10*3/uL (ref 0.7–4.0)
MCHC: 33.7 g/dL (ref 30.0–36.0)
MCV: 86.6 fl (ref 78.0–100.0)
Monocytes Absolute: 0.3 10*3/uL (ref 0.1–1.0)
Monocytes Relative: 9 % (ref 3.0–12.0)
Neutro Abs: 1.3 10*3/uL — ABNORMAL LOW (ref 1.4–7.7)
Neutrophils Relative %: 46.3 % (ref 43.0–77.0)
Platelets: 221 10*3/uL (ref 150.0–400.0)
RBC: 4.29 Mil/uL (ref 3.87–5.11)
RDW: 15.5 % (ref 11.5–15.5)
WBC: 2.9 10*3/uL — ABNORMAL LOW (ref 4.0–10.5)

## 2022-03-12 LAB — TSH: TSH: 1.78 u[IU]/mL (ref 0.35–5.50)

## 2022-03-12 LAB — HEMOGLOBIN A1C: Hgb A1c MFr Bld: 6 % (ref 4.6–6.5)

## 2022-03-12 MED ORDER — ATENOLOL 25 MG PO TABS: 12.5000 mg | ORAL_TABLET | Freq: Every day | ORAL | 3 refills | Status: DC

## 2022-03-12 NOTE — Patient Instructions (Signed)
Return in about 1 year (around 03/14/2023) for cpe (20 min).        Great to see you today.  I have refilled the medication(s) we provide.   If labs were collected, we will inform you of lab results once received either by echart message or telephone call.   - echart message- for normal results that have been seen by the patient already.   - telephone call: abnormal results or if patient has not viewed results in their echart.  Health Maintenance After Age 67 After age 51, you are at a higher risk for certain long-term diseases and infections as well as injuries from falls. Falls are a major cause of broken bones and head injuries in people who are older than age 35. Getting regular preventive care can help to keep you healthy and well. Preventive care includes getting regular testing and making lifestyle changes as recommended by your health care provider. Talk with your health care provider about: Which screenings and tests you should have. A screening is a test that checks for a disease when you have no symptoms. A diet and exercise plan that is right for you. What should I know about screenings and tests to prevent falls? Screening and testing are the best ways to find a health problem early. Early diagnosis and treatment give you the best chance of managing medical conditions that are common after age 58. Certain conditions and lifestyle choices may make you more likely to have a fall. Your health care provider may recommend: Regular vision checks. Poor vision and conditions such as cataracts can make you more likely to have a fall. If you wear glasses, make sure to get your prescription updated if your vision changes. Medicine review. Work with your health care provider to regularly review all of the medicines you are taking, including over-the-counter medicines. Ask your health care provider about any side effects that may make you more likely to have a fall. Tell your health care provider  if any medicines that you take make you feel dizzy or sleepy. Strength and balance checks. Your health care provider may recommend certain tests to check your strength and balance while standing, walking, or changing positions. Foot health exam. Foot pain and numbness, as well as not wearing proper footwear, can make you more likely to have a fall. Screenings, including: Osteoporosis screening. Osteoporosis is a condition that causes the bones to get weaker and break more easily. Blood pressure screening. Blood pressure changes and medicines to control blood pressure can make you feel dizzy. Depression screening. You may be more likely to have a fall if you have a fear of falling, feel depressed, or feel unable to do activities that you used to do. Alcohol use screening. Using too much alcohol can affect your balance and may make you more likely to have a fall. Follow these instructions at home: Lifestyle Do not drink alcohol if: Your health care provider tells you not to drink. If you drink alcohol: Limit how much you have to: 0-1 drink a day for women. 0-2 drinks a day for men. Know how much alcohol is in your drink. In the [place] one drink equals one 12 oz bottle of beer (355 mL), one 5 oz glass of wine (148 mL), or one 1 oz glass of hard liquor (44 mL). Do not use any products that contain nicotine or tobacco. These products include cigarettes, chewing tobacco, and vaping devices, such as e-cigarettes. If you need help quitting, ask  your health care provider. Activity  Follow a regular exercise program to stay fit. This will help you maintain your balance. Ask your health care provider what types of exercise are appropriate for you. If you need a cane or walker, use it as recommended by your health care provider. Wear supportive shoes that have nonskid soles. Safety  Remove any tripping hazards, such as rugs, cords, and clutter. Install safety equipment such as grab bars in bathrooms  and safety rails on stairs. Keep rooms and walkways well-lit. General instructions Talk with your health care provider about your risks for falling. Tell your health care provider if: You fall. Be sure to tell your health care provider about all falls, even ones that seem minor. You feel dizzy, tiredness (fatigue), or off-balance. Take over-the-counter and prescription medicines only as told by your health care provider. These include supplements. Eat a healthy diet and maintain a healthy weight. A healthy diet includes low-fat dairy products, low-fat (lean) meats, and fiber from whole grains, beans, and lots of fruits and vegetables. Stay current with your vaccines. Schedule regular health, dental, and eye exams. Summary Having a healthy lifestyle and getting preventive care can help to protect your health and wellness after age 39. Screening and testing are the best way to find a health problem early and help you avoid having a fall. Early diagnosis and treatment give you the best chance for managing medical conditions that are more common for people who are older than age 60. Falls are a major cause of broken bones and head injuries in people who are older than age 40. Take precautions to prevent a fall at home. Work with your health care provider to learn what changes you can make to improve your health and wellness and to prevent falls. This information is not intended to replace advice given to you by your health care provider. Make sure you discuss any questions you have with your health care provider. Document Revised: 11/18/2020 Document Reviewed: 11/18/2020 Elsevier Patient Education  [employer].

## 2022-03-12 NOTE — Progress Notes (Signed)
Patient ID: Leah Perkins, female  DOB: 11/14/1955, 66 y.o.   MRN: [id number] Patient Care Team    Relationship Specialty Notifications Start End  Natalia Leatherwood, DO PCP - General Family Medicine  02/13/19   Cleda Mccreedy, MD  Gynecologic Oncology  12/26/11   Laurette Schimke, MD Attending Physician Obstetrics and Gynecology  02/13/19   Judi Saa, DO Consulting Physician Sports Medicine  02/13/19   Harriette Bouillon, MD Consulting Physician General Surgery  10/13/21   Antionette Char, MD Consulting Physician Obstetrics and Gynecology  10/13/21     Chief Complaint  Patient presents with   Annual Exam    [facility]; pt is fasting    Subjective:  Leah Perkins is a 66 y.o.  Female  present for CPE/CMC  All past medical history, surgical history, allergies, family history, immunizations, medications and social history were updated in the electronic medical record today. All recent labs, ED visits and hospitalizations within the last year were reviewed.  Health maintenance:  Colonoscopy: completed 2019, by Dr. Dulce Sellar, resutls "normal"  follow up 10 years Mammogram: completed:09/23/2021- [facility]> ordered placed for 2024 Cervical cancer screening: hysterectomy- for ovarian cancer>>continue to follow with gyn Immunizations: tdap 2015 UTD, Influenza (encouraged yearly), shingrix completed. covid series completed Infectious disease screening: HIV and Hep C completed.  DEXA: completed 04/07/2016- WNL Patient has a Dental home. Hospitalizations/ED visits: reviewed  Hypertension/HLD:  Pt reports compliance  with atenolol 12.5 mg qd. Blood pressures ranges at home WNL. Patient denies chest pain, shortness of breath, dizziness or lower extremity edema.  Diet: low sodium Exercise: routine RF: htn, hld, overweight, fhx.      08/20/2021   11:42 AM 03/11/2021    9:00 AM 08/14/2020    8:35 AM 08/15/2019   10:02 AM 02/13/2019   11:04 AM  Depression screen PHQ 2/9  Decreased Interest 0 0 0 0 0  Down,  Depressed, Hopeless 0 0 0 0 0  PHQ - 2 Score 0 0 0 0 0       No data to display           Immunization History  Administered Date(s) Administered   Fluad Quad(high Dose 65+) 08/14/2020, 03/11/2021   Influenza Split 04/26/2017   Influenza,inj,Quad PF,6+ Mos 05/02/2019   Influenza-Unspecified 04/27/2018   PFIZER(Purple Top)SARS-COV-2 Vaccination 07/24/2019, 08/12/2019, 03/05/2020   PNEUMOCOCCAL CONJUGATE-20 09/09/2021   Pneumococcal Conjugate-13 08/14/2020   Tdap 07/13/2013   Zoster Recombinat (Shingrix) 02/13/2019, 05/16/2019     Past Medical History:  Diagnosis Date   Allergy    Basal cell carcinoma of face 09/09/2021   Chicken pox    Hypertension    Inflamed seborrheic keratosis 09/09/2021   Ovarian cancer (HCC) 09/2011   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 9/13,  Followed by Dr Gehrig/Dr Jean Rosenthal- [facility]   Patellar contusion, right, initial encounter 03/21/2018   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   UTI (urinary tract infection)    Patient denies this dx   Allergies  Allergen Reactions   Lisinopril Other (See Comments)    Decreased renal fx while on- resolved when dc'd   Sulfa Antibiotics Other (See Comments)    Unknown childhood reaction   Past Surgical History:  Procedure Laterality Date   ABDOMINAL HYSTERECTOMY  09/2011   APPENDECTOMY  2009   w/ hysterectomy   COLONOSCOPY     DEBULKING  09/2011   DILATION AND CURETTAGE OF UTERUS  1996   HAMMER TOE SURGERY Right  2017   HERNIA REPAIR  02/08/20   INGUINAL HERNIA REPAIR Right 02/08/2020   Procedure: REPAIR RIGHT INGUINAL HERNIA WITH MESH;  Surgeon: Harriette Bouillon, MD;  Location: [facility] OR;  Service: General;  Laterality: Right;  GENERAL AND TAP BLOCK   KNEE ARTHROSCOPY Right 2009   Knee   LAPAROSCOPIC LYSIS OF ADHESIONS N/A 10/17/2021   Procedure: LAPAROSCOPIC LYSIS OF ADHESIONS;  Surgeon: Gaynelle Adu, MD;  Location: [facility];  Service: General;  Laterality: N/A;  30 minutes     LAPAROSCOPY N/A 10/17/2021   Procedure: LAPAROSCOPY DIAGNOSTIC;  Surgeon: Gaynelle Adu, MD;  Location: [facility];  Service: General;  Laterality: N/A;   TONSILLECTOMY  1991   TOTAL ABDOMINAL HYSTERECTOMY W/ BILATERAL SALPINGOOPHORECTOMY  09/2011   TAHBSO with appendectomy and optimal tumor debulking in [place]   WISDOM TOOTH EXTRACTION  1985   Family History  Problem Relation Age of Onset   Hypertension Mother    Hypertrophic cardiomyopathy Mother 48       pt negativ rofr gene 2012-08-08   Early death Mother    Alzheimer's disease Father    Cancer Brother        Squamous cell of sinus, hx smok inhalation Public house manager)   Early death Brother    Heart attack Brother    Lymphoma Paternal Uncle        late 50's/early 55's   Alzheimer's disease Paternal Grandmother    Heart attack Paternal Grandfather 28   Miscarriages / Stillbirths Daughter    Hypertrophic cardiomyopathy Son    Early death Son    Early death Son    Colon cancer Neg Hx    Breast cancer Neg Hx    Ovarian cancer Neg Hx    Endometrial cancer Neg Hx    Pancreatic cancer Neg Hx    Prostate cancer Neg Hx    Social History   Social History Narrative   Marital status/children/pets: Married, 3 children. Has grandchildren   Education/employment: [employer] retired   Field seismologist:      -Wears a bicycle helmet riding a bike: Yes     -smoke alarm in the home:Yes     - wears seatbelt: Yes     - Feels safe in their relationships: Yes    Allergies as of 03/12/2022       Reactions   Lisinopril Other (See Comments)   Decreased renal fx while on- resolved when dc'd   Sulfa Antibiotics Other (See Comments)   Unknown childhood reaction        Medication List        Accurate as of March 12, 2022  8:28 AM. If you have any questions, ask your nurse or doctor.          STOP taking these medications    ibuprofen 200 MG tablet Commonly known as: ADVIL Stopped by: Felix Pacini, DO       TAKE these medications    acetaminophen  500 MG tablet Commonly known as: TYLENOL Take 1,000 mg by mouth daily as needed for headache (pain).   ascorbic acid 500 MG tablet Commonly known as: VITAMIN C Take 500 mg by mouth 2 (two) times daily.   atenolol 25 MG tablet Commonly known as: TENORMIN Take 0.5 tablets (12.5 mg total) by mouth daily. What changed: when to take this   CALCIUM 600 + D PO Take 1 tablet by mouth in the morning and at bedtime.   multivitamin with minerals tablet Take 1 tablet by mouth at bedtime.  All past medical history, surgical history, allergies, family history, immunizations andmedications were updated in the EMR today and reviewed under the history and medication portions of their EMR.     No results found for this or any previous visit (from the past 2160 hour(s)).  DG Abd Portable 1V  Result Date: 10/14/2021  IMPRESSION: Persistent mildly dilated loops of small bowel within the abdomen measuring up to 3.2 cm in diameter, similar to the previous study. Continued radiographic follow-up recommended.  DG Abd Portable 1V  Result Date: 10/13/2021 CLINICAL DATA:  Acute aortic syndrome (AAS) suspected. Chest pain, back pain, left upper abdominal pain EXAM: CT ANGIOGRAPHY CHEST, ABDOMEN AND PELVIS TECHNIQUE: Non-contrast CT of the chest was IMPRESSION: No evidence of thoracoabdominal aortic aneurysm or dissection. Mid small bowel obstruction with a single point transition within the mid jejunum within the periumbilical region anteriorly. Mild ascites. No free intraperitoneal gas. No evidence of bowel ischemia at this time. Electronically Signed   By: Fidela Salisbury M.D.   On: 10/13/2021 19:17   DG Chest 2 View Result Date: 10/13/2021 IMPRESSION: No active disease. Tortuous aorta, as might be seen with a history of hypertension.    ROS 14 pt review of systems performed and negative (unless mentioned in an HPI)  Objective: BP 133/86   Pulse (!) 57   Temp 97.6 F (36.4 C) (Oral)   Ht $R'5\' 6"'SL$   (1.676 m)   Wt 152 lb (68.9 kg)   LMP  (LMP Unknown)   SpO2 97%   BMI 24.53 kg/m  Physical Exam Vitals and nursing note reviewed.  Constitutional:      General: She is not in acute distress.    Appearance: Normal appearance. She is not ill-appearing or toxic-appearing.  HENT:     Head: Normocephalic and atraumatic.     Right Ear: Tympanic membrane, ear canal and external ear normal. There is no impacted cerumen.     Left Ear: Tympanic membrane, ear canal and external ear normal. There is no impacted cerumen.     Nose: No congestion or rhinorrhea.     Mouth/Throat:     Mouth: Mucous membranes are moist.     Pharynx: Oropharynx is clear. No oropharyngeal exudate or posterior oropharyngeal erythema.  Eyes:     General: No scleral icterus.       Right eye: No discharge.        Left eye: No discharge.     Extraocular Movements: Extraocular movements intact.     Conjunctiva/sclera: Conjunctivae normal.     Pupils: Pupils are equal, round, and reactive to light.  Neck:     Comments: Left submandibular lymphadenopathy x1.  Nontender.  Quarter size hard nodule palpated. Cardiovascular:     Rate and Rhythm: Normal rate and regular rhythm.     Pulses: Normal pulses.     Heart sounds: Normal heart sounds. No murmur heard.    No friction rub. No gallop.  Pulmonary:     Effort: Pulmonary effort is normal. No respiratory distress.     Breath sounds: Normal breath sounds. No stridor. No wheezing, rhonchi or rales.  Chest:     Chest wall: No tenderness.  Abdominal:     General: Abdomen is flat. Bowel sounds are normal. There is no distension.     Palpations: Abdomen is soft. There is no mass.     Tenderness: There is no abdominal tenderness. There is no right CVA tenderness, left CVA tenderness, guarding or rebound.  Hernia: No hernia is present.  Musculoskeletal:        General: No swelling, tenderness or deformity. Normal range of motion.     Cervical back: Normal range of motion and  neck supple. No rigidity or tenderness.     Right lower leg: No edema.     Left lower leg: No edema.  Lymphadenopathy:     Cervical: No cervical adenopathy.  Skin:    General: Skin is warm and dry.     Coloration: Skin is not jaundiced or pale.     Findings: No bruising, erythema, lesion or rash.  Neurological:     General: No focal deficit present.     Mental Status: She is alert and oriented to person, place, and time. Mental status is at baseline.     Cranial Nerves: No cranial nerve deficit.     Sensory: No sensory deficit.     Motor: No weakness.     Coordination: Coordination normal.     Gait: Gait normal.     Deep Tendon Reflexes: Reflexes normal.  Psychiatric:        Mood and Affect: Mood normal.        Behavior: Behavior normal.        Thought Content: Thought content normal.        Judgment: Judgment normal.       No results found.  Assessment/plan: SAMIHA DENAPOLI is a 66 y.o. female present for CPE/CMC Essential hypertension/HLD Stable.  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 CBC with differential Neck ultrasound ordered  Routine general medical examination at a health care facility Colonoscopy: completed 2019, by Dr. Paulita Fujita, resutls "normal"  follow up 10 years Mammogram: completed:09/23/2021- [facility]> ordered placed for 2024 Cervical cancer screening: hysterectomy- for ovarian cancer>>continue to follow with gyn Immunizations: tdap 2015 UTD, Influenza (encouraged yearly), shingrix completed. covid series completed Infectious disease  screening: HIV and Hep C completed.  DEXA: completed 04/07/2016- WNL Patient was encouraged to exercise greater than 150 minutes a week. Patient was encouraged to choose a diet filled with fresh fruits and vegetables, and lean meats. AVS provided to patient today for education/recommendation on gender specific health and safety maintenance.  Return in about 1 year (around 03/14/2023) for cpe (20 min).    Orders Placed This Encounter  Procedures   MM 3D SCREEN BREAST BILATERAL   US Soft Tissue Head/Neck (NON-THYROID)   Comprehensive metabolic panel   Hemoglobin A1c   Lipid panel   TSH   Vitamin D (25 hydroxy)   CBC w/Diff   Meds ordered this encounter  Medications   atenolol (TENORMIN) 25 MG tablet    Sig: Take 0.5 tablets (12.5 mg total) by mouth daily.    Dispense:  45 tablet    Refill:  3   Referral Orders  No referral(s) requested today     Electronically signed by: Howard Pouch, [place]

## 2022-03-14 ENCOUNTER — Ambulatory Visit (HOSPITAL_BASED_OUTPATIENT_CLINIC_OR_DEPARTMENT_OTHER)
Admission: RE | Admit: 2022-03-14 | Discharge: 2022-03-14 | Disposition: A | Discharge status: Home / Self Care | Payer: Medicare HMO | Source: Ambulatory Visit | Attending: Family Medicine | Admitting: Family Medicine

## 2022-03-14 DIAGNOSIS — Z807 Family history of other malignant neoplasms of lymphoid, hematopoietic and related tissues: Secondary | ICD-10-CM | POA: Diagnosis present

## 2022-03-14 DIAGNOSIS — I10 Essential (primary) hypertension: Secondary | ICD-10-CM | POA: Diagnosis present

## 2022-03-18 ENCOUNTER — Telehealth: Payer: Self-pay | Admitting: Family Medicine

## 2022-03-18 NOTE — Telephone Encounter (Signed)
Spoke with patient regarding results/recommendations.  

## 2022-03-18 NOTE — Telephone Encounter (Signed)
Please call patient Leah Perkins functions are normal Blood cell counts and electrolytes are normal Diabetes screening/A1c is stable-glucose is normal. Cholesterol panel looks good and is at goal. Vitamin D levels are normal. Kidney function is just very mildly decreased.  Her creatinine is normal which is reassuring.  Encouraged her to hydrate.  No further intervention needed at this time.  Lastly, the WBCs appear slightly lower than normal, but each individual type of WBCs is within normal range.  This is not concerning at this time, but something to keep an eye on moving forward. 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

## 2022-08-05 ENCOUNTER — Telehealth: Payer: Self-pay | Admitting: Family Medicine

## 2022-08-05 NOTE — Telephone Encounter (Signed)
Left message for patient to schedule Annual Wellness Visit.  Please schedule(office,telephone/video call) with Nurse Health Advisor at [facility].  Please call [phone] ask for [facility]

## 2022-08-28 ENCOUNTER — Encounter: Payer: Self-pay | Admitting: Family Medicine

## 2022-08-28 ENCOUNTER — Telehealth (INDEPENDENT_AMBULATORY_CARE_PROVIDER_SITE_OTHER): Payer: Medicare HMO | Admitting: Family Medicine

## 2022-08-28 DIAGNOSIS — U071 COVID-19: Secondary | ICD-10-CM

## 2022-08-28 MED ORDER — NIRMATRELVIR/RITONAVIR (PAXLOVID)TABLET: 3.0000 | ORAL_TABLET | Freq: Two times a day (BID) | ORAL | 0 refills | Status: AC

## 2022-08-28 NOTE — Progress Notes (Signed)
VIRTUAL VISIT VIA VIDEO  I connected with Leah Perkins on 08/28/22 at 11:20 AM EST by a video enabled telemedicine application and verified that I am speaking with the correct person using two identifiers. Location patient: Home Location provider: [facility], Office Persons participating in the virtual visit: Patient, Dr. Raoul Pitch and Marlou Porch, CMA  I discussed the limitations of evaluation and management by telemedicine and the availability of in person appointments. The patient expressed understanding and agreed to proceed.     Leah Perkins , 11-25-1955, 67 y.o., female MRN: [id number] Patient Care Team    Relationship Specialty Notifications Start End  Ma Hillock, DO PCP - General Family Medicine  02/13/19   Nancy Marus, MD  Gynecologic Oncology  12/26/11   Janie Morning, MD Attending Physician Obstetrics and Gynecology  02/13/19   Lyndal Pulley, [place] Physician Sports Medicine  02/13/19   Erroll Luna, MD Consulting Physician General Surgery  10/13/21   Lahoma Crocker, MD Consulting Physician Obstetrics and Gynecology  10/13/21     Chief Complaint  Patient presents with   Covid Positive    Sore throat; fatigue; chills     Subjective: Leah Perkins is a 67 y.o.  Pt presents for an OV with complaints of covid positive test of 1 day duration.  Associated symptoms include sore throat, fatigue and chills. Her husband had COVID earlier this week. Her symptoms started last night. She denies coughor shortness of breath     08/20/2021   11:42 AM 03/11/2021    9:00 AM 08/14/2020    8:35 AM 08/15/2019   10:02 AM 02/13/2019   11:04 AM  Depression screen PHQ 2/9  Decreased Interest 0 0 0 0 0  Down, Depressed, Hopeless 0 0 0 0 0  PHQ - 2 Score 0 0 0 0 0    Allergies  Allergen Reactions   Lisinopril Other (See Comments)    Decreased renal fx while on- resolved when dc'd   Sulfa Antibiotics Other (See Comments)    Unknown childhood reaction   Social  History   Social History Narrative   Marital status/children/pets: Married, 3 children. 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 9/13,  Followed by Dr Gehrig/Dr Glennon Mac- Laurance Flatten   Patellar contusion, right, initial encounter 03/21/2018   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   UTI (urinary tract infection)    Patient denies this dx   Past Surgical History:  Procedure Laterality Date   ABDOMINAL HYSTERECTOMY  09/2011   APPENDECTOMY  2009   w/ hysterectomy   COLONOSCOPY     DEBULKING  09/2011   DILATION AND CURETTAGE OF UTERUS  1996   HAMMER TOE SURGERY Right 2017   HERNIA REPAIR  02/08/20   INGUINAL HERNIA REPAIR Right 02/08/2020   Procedure: REPAIR RIGHT INGUINAL HERNIA WITH MESH;  Surgeon: Erroll Luna, MD;  Location: [facility];  Service: General;  Laterality: Right;  GENERAL AND TAP BLOCK   KNEE ARTHROSCOPY Right 2009   Knee   LAPAROSCOPIC LYSIS OF ADHESIONS N/A 10/17/2021   Procedure: LAPAROSCOPIC LYSIS OF ADHESIONS;  Surgeon: Greer Pickerel, MD;  Location: [facility];  Service: General;  Laterality: N/A;  30 minutes    LAPAROSCOPY N/A 10/17/2021   Procedure: LAPAROSCOPY DIAGNOSTIC;  Surgeon: Greer Pickerel, MD;  Location: [facility];  Service: General;  Laterality: N/A;   TONSILLECTOMY  1991   TOTAL ABDOMINAL HYSTERECTOMY W/ BILATERAL SALPINGOOPHORECTOMY  09/2011   TAHBSO with appendectomy and optimal tumor debulking in [place]   Family History  Problem Relation Age of Onset   Hypertension Mother    Hypertrophic  cardiomyopathy Mother 49       pt negativ rofr gene 13-Aug-2012   Early death Mother    Alzheimer's disease Father    Cancer Brother        Squamous cell of sinus, hx smok inhalation Engineer, drilling)   Early death Brother    Heart attack Brother    Lymphoma Paternal Uncle        late 50's/early 41's   Alzheimer's disease Paternal Grandmother    Heart attack Paternal Grandfather 64   Miscarriages / Stillbirths Daughter    Hypertrophic cardiomyopathy Son    Early death Son    Early death Son    Colon cancer Neg Hx    Breast cancer Neg Hx    Ovarian cancer Neg Hx    Endometrial cancer Neg Hx    Pancreatic cancer Neg Hx    Prostate cancer Neg Hx    Allergies as of 08/28/2022       Reactions   Lisinopril Other (See Comments)   Decreased renal fx while on- resolved when dc'd   Sulfa Antibiotics Other (See Comments)   Unknown childhood reaction        Medication List        Accurate as of August 28, 2022  9:28 AM. If you have any questions, ask your nurse or doctor.          acetaminophen 500 MG tablet Commonly known as: TYLENOL Take 1,000 mg by mouth daily as needed for headache (pain).   ascorbic acid 500 MG tablet Commonly known as: VITAMIN C Take 500 mg by mouth 2 (two) times daily.   atenolol 25 MG tablet Commonly known as: TENORMIN Take 0.5 tablets (12.5 mg total) by mouth daily.   CALCIUM 600 + D PO Take 1 tablet by mouth in the morning and at bedtime.   multivitamin with minerals tablet Take 1 tablet by mouth at bedtime.        All past medical history, surgical history, allergies, family history, immunizations andmedications were updated in the EMR today and reviewed under the history and medication portions of their EMR.     Review of Systems  Constitutional:  Positive for chills and malaise/fatigue. Negative for fever.  HENT:  Positive for congestion and sore throat. Negative for ear pain and sinus pain.   Eyes:  Negative for pain and discharge.   Respiratory:  Negative for cough, sputum production, shortness of breath and wheezing.   Cardiovascular: Negative.   Gastrointestinal: Negative.   Musculoskeletal: Negative.   Skin:  Negative for rash.  Neurological:  Negative for dizziness and headaches.   Negative, with the exception of above mentioned in HPI   Objective:  LMP  (LMP Unknown)  There is no height or weight  on file to calculate BMI. Physical Exam Vitals and nursing note reviewed.  Constitutional:      General: She is not in acute distress.    Appearance: Normal appearance. She is not toxic-appearing.  HENT:     Head: Normocephalic and atraumatic.  Eyes:     General: No scleral icterus.       Right eye: No discharge.        Left eye: No discharge.     Conjunctiva/sclera: Conjunctivae normal.  Pulmonary:     Effort: Pulmonary effort is normal.  Musculoskeletal:     Cervical back: Normal range of motion.  Skin:    Findings: No rash.  Neurological:     Mental Status: She is alert and oriented to person, place, and time. Mental status is at baseline.  Psychiatric:        Mood and Affect: Mood normal.        Behavior: Behavior normal.        Thought Content: Thought content normal.        Judgment: Judgment normal.     No results found. No results found. No results found for this or any previous visit (from the past 24 hour(s)).  Assessment/Plan: Leah Perkins is a 67 y.o. female present for OV for  1. COVID-19 Rest, hydrate.  mucinex (DM if cough), nettie pot or nasal saline.  paxlovid prescribed, take until completed.  Reviewed home care instructions for COVID. Advised self-isolation at home for at least 5 days if able. After 5 days, if improved and fever resolved, can be in public, but should wear a mask around others for an additional 5 days. If symptoms, esp, dyspnea develops/worsens, recommend in-person evaluation at either an urgent care or the emergency room.  Reviewed expectations re: course  of current medical issues. Discussed self-management of symptoms. Outlined signs and symptoms indicating need for more acute intervention. Patient verbalized understanding and all questions were answered. Patient received an After-Visit Summary.    No orders of the defined types were placed in this encounter.  No orders of the defined types were placed in this encounter.  Referral Orders  No referral(s) requested today     Note is dictated utilizing voice recognition software. Although note has been proof read prior to signing, occasional typographical errors still can be missed. If any questions arise, please do not hesitate to call for verification.   electronically signed by:  Howard Pouch, DO  [place]

## 2022-08-28 NOTE — Patient Instructions (Addendum)
Return in about 2 weeks (around 09/11/2022), or if symptoms worsen or fail to improve.        Great to see you today.  I have refilled the medication(s) we provide.   If labs were collected, we will inform you of lab results once received either by echart message or telephone call.   - echart message- for normal results that have been seen by the patient already.   - telephone call: abnormal results or if patient has not viewed results in their echart.

## 2022-09-02 ENCOUNTER — Telehealth: Payer: Self-pay | Admitting: Family Medicine

## 2022-09-02 NOTE — Telephone Encounter (Signed)
Contacted Leah Perkins to schedule their annual wellness visit. Appointment made for 09/09/2022.    [place] Direct Dial [phone]

## 2022-09-09 ENCOUNTER — Ambulatory Visit (INDEPENDENT_AMBULATORY_CARE_PROVIDER_SITE_OTHER): Payer: Medicare HMO

## 2022-09-09 VITALS — Wt 149.0 lb

## 2022-09-09 DIAGNOSIS — Z0000 Encounter for general adult medical examination without abnormal findings: Secondary | ICD-10-CM | POA: Diagnosis not present

## 2022-09-09 NOTE — Patient Instructions (Signed)
Leah Perkins , Thank you for taking time to come for your Medicare Wellness Visit. I appreciate your ongoing commitment to your health goals. Please review the following plan we discussed and let me know if I can assist you in the future.   These are the goals we discussed:  Goals      Patient Stated     Stay Healthy     Patient Stated     Stay healthy         This is a list of the screening recommended for you and due dates:  Health Maintenance  Topic Date Due   Flu Shot  10/11/2022*   DTaP/Tdap/Td vaccine (2 - Td or Tdap) 07/14/2023   Medicare Annual Wellness Visit  09/10/2023   Mammogram  09/24/2023   Colon Cancer Screening  12/15/2027   Pneumonia Vaccine  Completed   DEXA scan (bone density measurement)  Completed   Zoster (Shingles) Vaccine  Completed   HPV Vaccine  Aged Out   COVID-19 Vaccine  Discontinued   Hepatitis C Screening: USPSTF Recommendation to screen - Ages 46-79 yo.  Discontinued  *Topic was postponed. The date shown is not the original due date.    Advanced directives: Please bring a copy of your health care power of attorney and living will to the office at your convenience.  Conditions/risks identified: stay healthy and active   Next appointment: Follow up in one year for your annual wellness visit    Preventive Care 65 Years and Older, Female Preventive care refers to lifestyle choices and visits with your health care provider that can promote health and wellness. What does preventive care include? A yearly physical exam. This is also called an annual well check. Dental exams once or twice a year. Routine eye exams. Ask your health care provider how often you should have your eyes checked. Personal lifestyle choices, including: Daily care of your teeth and gums. Regular physical activity. Eating a healthy diet. Avoiding tobacco and drug use. Limiting alcohol use. Practicing safe sex. Taking low-dose aspirin every day. Taking vitamin and mineral  supplements as recommended by your health care provider. What happens during an annual well check? The services and screenings done by your health care provider during your annual well check will depend on your age, overall health, lifestyle risk factors, and family history of disease. Counseling  Your health care provider may ask you questions about your: Alcohol use. Tobacco use. Drug use. Emotional well-being. Home and relationship well-being. Sexual activity. Eating habits. History of falls. Memory and ability to understand (cognition). Work and work Statistician. Reproductive health. Screening  You may have the following tests or measurements: Height, weight, and BMI. Blood pressure. Lipid and cholesterol levels. These may be checked every 5 years, or more frequently if you are over 63 years old. Skin check. Lung cancer screening. You may have this screening every year starting at age 58 if you have a 30-pack-year history of smoking and currently smoke or have quit within the past 15 years. Fecal occult blood test (FOBT) of the stool. You may have this test every year starting at age 34. Flexible sigmoidoscopy or colonoscopy. You may have a sigmoidoscopy every 5 years or a colonoscopy every 10 years starting at age 88. Hepatitis C blood test. Hepatitis B blood test. Sexually transmitted disease (STD) testing. Diabetes screening. This is done by checking your blood sugar (glucose) after you have not eaten for a while (fasting). You may have this done every 1-3  years. Bone density scan. This is done to screen for osteoporosis. You may have this done starting at age 31. Mammogram. This may be done every 1-2 years. Talk to your health care provider about how often you should have regular mammograms. Talk with your health care provider about your test results, treatment options, and if necessary, the need for more tests. Vaccines  Your health care provider may recommend certain  vaccines, such as: Influenza vaccine. This is recommended every year. Tetanus, diphtheria, and acellular pertussis (Tdap, Td) vaccine. You may need a Td booster every 10 years. Zoster vaccine. You may need this after age 76. Pneumococcal 13-valent conjugate (PCV13) vaccine. One dose is recommended after age 50. Pneumococcal polysaccharide (PPSV23) vaccine. One dose is recommended after age 44. Talk to your health care provider about which screenings and vaccines you need and how often you need them. This information is not intended to replace advice given to you by your health care provider. Make sure you discuss any questions you have with your health care provider. Document Released: 07/26/2015 Document Revised: 03/18/2016 Document Reviewed: 04/30/2015 Elsevier Interactive Patient Education  2017 [place] Prevention in the Home Falls can cause injuries. They can happen to people of all ages. There are many things you can do to make your home safe and to help prevent falls. What can I do on the outside of my home? Regularly fix the edges of walkways and driveways and fix any cracks. Remove anything that might make you trip as you walk through a door, such as a raised step or threshold. Trim any bushes or trees on the path to your home. Use bright outdoor lighting. Clear any walking paths of anything that might make someone trip, such as rocks or tools. Regularly check to see if handrails are loose or broken. Make sure that both sides of any steps have handrails. Any raised decks and porches should have guardrails on the edges. Have any leaves, snow, or ice cleared regularly. Use sand or salt on walking paths during winter. Clean up any spills in your garage right away. This includes oil or grease spills. What can I do in the bathroom? Use night lights. Install grab bars by the toilet and in the tub and shower. Do not use towel bars as grab bars. Use non-skid mats or decals in  the tub or shower. If you need to sit down in the shower, use a plastic, non-slip stool. Keep the floor dry. Clean up any water that spills on the floor as soon as it happens. Remove soap buildup in the tub or shower regularly. Attach bath mats securely with double-sided non-slip rug tape. Do not have throw rugs and other things on the floor that can make you trip. What can I do in the bedroom? Use night lights. Make sure that you have a light by your bed that is easy to reach. Do not use any sheets or blankets that are too big for your bed. They should not hang down onto the floor. Have a firm chair that has side arms. You can use this for support while you get dressed. Do not have throw rugs and other things on the floor that can make you trip. What can I do in the kitchen? Clean up any spills right away. Avoid walking on wet floors. Keep items that you use a lot in easy-to-reach places. If you need to reach something above you, use a strong step stool that has a grab  bar. Keep electrical cords out of the way. Do not use floor polish or wax that makes floors slippery. If you must use wax, use non-skid floor wax. Do not have throw rugs and other things on the floor that can make you trip. What can I do with my stairs? Do not leave any items on the stairs. Make sure that there are handrails on both sides of the stairs and use them. Fix handrails that are broken or loose. Make sure that handrails are as long as the stairways. Check any carpeting to make sure that it is firmly attached to the stairs. Fix any carpet that is loose or worn. Avoid having throw rugs at the top or bottom of the stairs. If you do have throw rugs, attach them to the floor with carpet tape. Make sure that you have a light switch at the top of the stairs and the bottom of the stairs. If you do not have them, ask someone to add them for you. What else can I do to help prevent falls? Wear shoes that: Do not have high  heels. Have rubber bottoms. Are comfortable and fit you well. Are closed at the toe. Do not wear sandals. If you use a stepladder: Make sure that it is fully opened. Do not climb a closed stepladder. Make sure that both sides of the stepladder are locked into place. Ask someone to hold it for you, if possible. Clearly mark and make sure that you can see: Any grab bars or handrails. First and last steps. Where the edge of each step is. Use tools that help you move around (mobility aids) if they are needed. These include: Canes. Walkers. Scooters. Crutches. Turn on the lights when you go into a dark area. Replace any light bulbs as soon as they burn out. Set up your furniture so you have a clear path. Avoid moving your furniture around. If any of your floors are uneven, fix them. If there are any pets around you, be aware of where they are. Review your medicines with your doctor. Some medicines can make you feel dizzy. This can increase your chance of falling. Ask your doctor what other things that you can do to help prevent falls. This information is not intended to replace advice given to you by your health care provider. Make sure you discuss any questions you have with your health care provider. Document Released: 04/25/2009 Document Revised: 12/05/2015 Document Reviewed: 08/03/2014 Elsevier Interactive Patient Education  2017 [employer].

## 2022-09-09 NOTE — Progress Notes (Signed)
I connected with  Leah Perkins on 09/09/22 by a audio enabled telemedicine application and verified that I am speaking with the correct person using two identifiers.  Patient Location: Home  Provider Location: Home Office  I discussed the limitations of evaluation and management by telemedicine. The patient expressed understanding and agreed to proceed.   Patient Medicare AWV questionnaire was completed by the patient on 09/05/22; I have confirmed that all information answered by patient is correct and no changes since this date.          Subjective:   Leah Perkins is a 66 y.o. female who presents for Medicare Annual (Subsequent) preventive examination.  Review of Systems     Cardiac Risk Factors include: advanced age (>23mn, >>63women);dyslipidemia;hypertension     Objective:    Today's Vitals   09/09/22 0936  Weight: 149 lb (67.6 kg)   Body mass index is 24.05 kg/m.     09/09/2022    9:41 AM 11/27/2021    1:45 PM 10/17/2021    8:10 AM 10/13/2021    1:53 PM 08/20/2021   11:43 AM 11/12/2020    3:32 PM 02/08/2020   11:56 AM  Advanced Directives  Does Patient Have a Medical Advance Directive? Yes Yes  Yes Yes Yes Yes  Type of AParamedicof [place] will [place]Living will H[place]Living will [place]Living will Healthcare Power of [place]Living will [place]and SpringsLiving will  Does patient want to make changes to medical advance directive?  No - Patient declined  No - Patient declined   No - Patient declined  Copy of HCenterportin Chart? No - copy requested   No - copy requested No - copy requested No - copy requested No - copy requested  Would patient like information on creating a medical advance directive?   No - Patient declined No - Patient declined       Current Medications (verified) Outpatient Encounter  Medications as of 09/09/2022  Medication Sig   acetaminophen (TYLENOL) 500 MG tablet Take 1,000 mg by mouth daily as needed for headache (pain).   atenolol (TENORMIN) 25 MG tablet Take 0.5 tablets (12.5 mg total) by mouth daily.   Calcium Carb-Cholecalciferol (CALCIUM 600 + D PO) Take 1 tablet by mouth in the morning and at bedtime.    Multiple Vitamins-Minerals (MULTIVITAMIN WITH MINERALS) tablet Take 1 tablet by mouth at bedtime.   vitamin C (ASCORBIC ACID) 500 MG tablet Take 500 mg by mouth 2 (two) times daily.   No facility-administered encounter medications on file as of 09/09/2022.    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   UTI (urinary tract infection)    Patient denies this dx   Past Surgical History:  Procedure Laterality Date   ABDOMINAL HYSTERECTOMY  09/2011   APPENDECTOMY  2009   w/ hysterectomy   COLONOSCOPY     DEBULKING  09/2011   DILATION AND CURETTAGE  OF UTERUS  1996   HAMMER TOE SURGERY Right 2017   HERNIA REPAIR  02/08/20   INGUINAL HERNIA REPAIR Right 02/08/2020   Procedure: REPAIR RIGHT INGUINAL HERNIA WITH MESH;  Surgeon: Erroll Luna, MD;  Location: [place];  Service: General;  Laterality: Right;  GENERAL AND TAP BLOCK   KNEE ARTHROSCOPY Right 2009   Knee   LAPAROSCOPIC LYSIS OF ADHESIONS N/A 10/17/2021   Procedure: LAPAROSCOPIC LYSIS OF ADHESIONS;  Surgeon: Greer Pickerel, MD;  Location: [facility];  Service: General;  Laterality: N/A;  30 minutes    LAPAROSCOPY N/A 10/17/2021   Procedure:  LAPAROSCOPY DIAGNOSTIC;  Surgeon: Greer Pickerel, MD;  Location: [facility];  Service: General;  Laterality: N/A;   TONSILLECTOMY  1991   TOTAL ABDOMINAL HYSTERECTOMY W/ BILATERAL SALPINGOOPHORECTOMY  09/2011   TAHBSO with appendectomy and optimal tumor debulking in [place]   Family History  Problem Relation Age of Onset   Hypertension Mother    Hypertrophic cardiomyopathy Mother 15       pt negativ rofr gene August 23, 2012   Early death Mother    Alzheimer's disease Father    Cancer Brother        Squamous cell of sinus, hx smok inhalation Engineer, drilling)   Early death Brother    Heart attack Brother    Lymphoma Paternal Uncle        late 50's/early 20's   Alzheimer's disease Paternal Grandmother    Heart attack Paternal Grandfather 39   Miscarriages / Stillbirths Daughter    Hypertrophic cardiomyopathy Son    Early death Son    Early death Son    Colon cancer Neg Hx    Breast cancer Neg Hx    Ovarian cancer Neg Hx    Endometrial cancer Neg Hx    Pancreatic cancer Neg Hx    Prostate cancer Neg Hx    Social History   Socioeconomic History   Marital status: Married    Spouse name: Not on file   Number of children: Not on file   Years of education: Not on file   Highest education level: Associate degree: occupational, Hotel manager, or vocational program  Occupational History   Not on file  Tobacco Use   Smoking status: Never   Smokeless tobacco: Never  Vaping Use   Vaping Use: Never used  Substance and Sexual Activity   Alcohol use: Not Currently    Comment: Socially ~1 / week   Drug use: No   Sexual activity: Yes    Partners: Male    Birth control/protection: Surgical    Comment: Hysterectomy  Other Topics Concern   Not on file  Social History Narrative   Marital status/children/pets: Married, 3 children. Has grandchildren   Education/employment: A.A.S., retired   Engineer, materials:      -Wears a bicycle helmet riding a bike: Yes     -smoke alarm in the  home:Yes     - wears seatbelt: Yes     - Feels safe in their relationships: Yes   Social Determinants of Health   Financial Resource Strain: Low Risk  (09/05/2022)   Overall Financial Resource Strain (CARDIA)    Difficulty of Paying Living Expenses: Not hard at all  Food Insecurity: No Food Insecurity (09/05/2022)   Hunger Vital Sign    Worried About Running Out of Food in the Last Year: Never true    Ran Out of Food in the Last Year: Never true  Transportation Needs: No  Transportation Needs (09/05/2022)   PRAPARE - Hydrologist (Medical): No    Lack of Transportation (Non-Medical): No  Physical Activity: Sufficiently Active (09/05/2022)   Exercise Vital Sign    Days of Exercise per Week: 6 days    Minutes of Exercise per Session: 80 min  Stress: No Stress Concern Present (09/05/2022)   [place]    Feeling of Stress : Only a little  Social Connections: Moderately Integrated (09/05/2022)   Social Connection and Isolation Panel [NHANES]    Frequency of Communication with Friends and Family: More than three times a week    Frequency of Social Gatherings with Friends and Family: More than three times a week    Attends Religious Services: 1 to 4 times per year    Active Member of [employer] or Organizations: No    Attends Music therapist: Never    Marital Status: Married    Tobacco Counseling Counseling given: Not Answered   Clinical Intake:  Pre-visit preparation completed: Yes  Pain : No/denies pain     BMI - recorded: 24.05 Nutritional Status: BMI of 19-24  Normal Nutritional Risks: None  How often do you need to have someone help you when you read instructions, pamphlets, or other written materials from your doctor or pharmacy?: 1 - Never  Diabetic?no  Interpreter Needed?: No  Information entered by :: Charlott Rakes, LPN   Activities of Daily Living     09/05/2022    5:16 PM 10/13/2021   11:00 PM  In your present state of health, do you have any difficulty performing the following activities:  Hearing? 0 0  Vision? 0 0  Difficulty concentrating or making decisions? 0 0  Walking or climbing stairs? 0 0  Dressing or bathing? 0 0  Doing errands, shopping? 0 0  Preparing Food and eating ? N   Using the Toilet? N   In the past six months, have you accidently leaked urine? N   Do you have problems with loss of bowel control? N   Managing your Medications? N   Managing your Finances? N   Housekeeping or managing your Housekeeping? N     Patient Care Team: Ma Hillock, DO as PCP - General (Family Medicine) Nancy Marus, MD (Gynecologic Oncology) Janie Morning, MD as Attending Physician (Obstetrics and Gynecology) Lyndal Pulley, DO as Consulting Physician (Sports Medicine) Erroll Luna, MD as Consulting Physician (General Surgery) Lahoma Crocker, MD as Consulting Physician (Obstetrics and Gynecology)  Indicate any recent Medical Services you may have received from other than [facility] providers in the past year (date may be approximate).     Assessment:   This is a routine wellness examination for [place].  Hearing/Vision screen Hearing Screening - Comments:: Pt denies any hearing issues  Vision Screening - Comments:: Pt follows up with [facility] for annual eye exam  Dietary issues and exercise activities discussed: Current Exercise Habits: Home exercise routine, Type of exercise: Other - see comments (dancing around house), Time (Minutes): > 60, Frequency (Times/Week): 6, Weekly Exercise (Minutes/Week): 0   Goals Addressed             This Visit's Progress    Patient Stated       Stay healthy        Depression Screen    09/09/2022    9:39 AM 08/20/2021   11:42 AM 03/11/2021    9:00 AM 08/14/2020  8:35 AM 08/15/2019   10:02 AM 02/13/2019   11:04 AM  PHQ 2/9 Scores  PHQ - 2 Score 0 0 0 0 0 0    Fall Risk     09/05/2022    5:16 PM 09/05/2021    8:47 AM 08/31/2021    9:48 AM 08/20/2021   11:44 AM 03/11/2021    9:01 AM  Fall Risk   Falls in the past year? 0 0 0 0 0  Number falls in past yr: 0   0 0  Injury with Fall? 0   0 0  Risk for fall due to : Impaired vision   Impaired vision   Follow up Falls prevention discussed   Falls prevention discussed Falls evaluation completed    FALL RISK PREVENTION PERTAINING TO THE HOME:  Any stairs in or around the home? Yes  If so, are there any without handrails? No  Home free of loose throw rugs in walkways, pet beds, electrical cords, etc? Yes  Adequate lighting in your home to reduce risk of falls? Yes   ASSISTIVE DEVICES UTILIZED TO PREVENT FALLS:  Life alert? No  Use of a cane, walker or w/c? No  Grab bars in the bathroom? No  Shower chair or bench in shower? No  Elevated toilet seat or a handicapped toilet? No   TIMED UP AND GO:  Was the test performed? No .   Cognitive Function:        09/09/2022    9:43 AM 08/20/2021   11:45 AM  6CIT Screen  What Year? 0 points 0 points  What month? 0 points 0 points  What time? 0 points 0 points  Count back from 20 0 points 0 points  Months in reverse 0 points 0 points  Repeat phrase 0 points 0 points  Total Score 0 points 0 points    Immunizations Immunization History  Administered Date(s) Administered   Fluad Quad(high Dose 65+) 08/14/2020, 03/11/2021   Influenza Split 04/26/2017   Influenza,inj,Quad PF,6+ Mos 05/02/2019   Influenza-Unspecified 04/27/2018   PFIZER(Purple Top)SARS-COV-2 Vaccination 07/24/2019, 08/12/2019, 03/05/2020   PNEUMOCOCCAL CONJUGATE-20 09/09/2021   Pneumococcal Conjugate-13 08/14/2020   Tdap 07/13/2013   Zoster Recombinat (Shingrix) 02/13/2019, 05/16/2019    TDAP status: Up to date  Flu Vaccine status: Due, Education has been provided regarding the importance of this vaccine. Advised may receive this vaccine at local pharmacy or Health Dept. Aware to provide a  copy of the vaccination record if obtained from local pharmacy or Health Dept. Verbalized acceptance and understanding.  Pneumococcal vaccine status: Up to date  Covid-19 vaccine status: Completed vaccines  Qualifies for Shingles Vaccine? Yes   Zostavax completed Yes   Shingrix Completed?: Yes  Screening Tests Health Maintenance  Topic Date Due   INFLUENZA VACCINE  10/11/2022 (Originally 02/10/2022)   DTaP/Tdap/Td (2 - Td or Tdap) 07/14/2023   Medicare Annual Wellness (AWV)  09/10/2023   MAMMOGRAM  09/24/2023   COLONOSCOPY (Pts 45-27yr Insurance coverage will need to be confirmed)  12/15/2027   Pneumonia Vaccine 68 Years old  Completed   DEXA SCAN  Completed   Zoster Vaccines- Shingrix  Completed   HPV VACCINES  Aged Out   COVID-19 Vaccine  Discontinued   Hepatitis C Screening  Discontinued    Health Maintenance  There are no preventive care reminders to display for this patient.   Colorectal cancer screening: Type of screening: Colonoscopy. Completed 12/14/17. Repeat every 10 years  Mammogram status: Completed 09/23/21. Repeat every year  Bone Density status: Completed 08/29/21. Results reflect: Bone density results: NORMAL. Repeat every 2 years.  Additional Screening:  Hepatitis C Screening: Completed 07/14/19  Vision Screening: Recommended annual ophthalmology exams for early detection of glaucoma and other disorders of the eye. Is the patient up to date with their annual eye exam?  Yes  Who is the provider or what is the name of the office in which the patient attends annual eye exams? [facility] If pt is not established with a provider, would they like to be referred to a provider to establish care? No .   Dental Screening: Recommended annual dental exams for proper oral hygiene  Community Resource Referral / Chronic Care Management: CRR required this visit?  No   CCM required this visit?  No      Plan:     I have personally reviewed and noted the following in  the patient's chart:   Medical and social history Use of alcohol, tobacco or illicit drugs  Current medications and supplements including opioid prescriptions. Patient is not currently taking opioid prescriptions. Functional ability and status Nutritional status Physical activity Advanced directives List of other physicians Hospitalizations, surgeries, and ER visits in previous 12 months Vitals Screenings to include cognitive, depression, and falls Referrals and appointments  In addition, I have reviewed and discussed with patient certain preventive protocols, quality metrics, and best practice recommendations. A written personalized care plan for preventive services as well as general preventive health recommendations were provided to patient.     Willette Brace, LPN   [id number]   Nurse Notes: none

## 2022-09-16 ENCOUNTER — Ambulatory Visit: Payer: Medicare HMO | Admitting: Family Medicine

## 2022-09-17 ENCOUNTER — Encounter: Payer: Self-pay | Admitting: Family Medicine

## 2022-09-17 ENCOUNTER — Ambulatory Visit (INDEPENDENT_AMBULATORY_CARE_PROVIDER_SITE_OTHER): Payer: Medicare HMO | Admitting: Family Medicine

## 2022-09-17 VITALS — BP 139/91 | HR 63 | Temp 97.5°F | Ht 66.0 in | Wt 154.2 lb

## 2022-09-17 DIAGNOSIS — R7309 Other abnormal glucose: Secondary | ICD-10-CM | POA: Diagnosis not present

## 2022-09-17 DIAGNOSIS — I10 Essential (primary) hypertension: Secondary | ICD-10-CM | POA: Diagnosis not present

## 2022-09-17 DIAGNOSIS — Z85828 Personal history of other malignant neoplasm of skin: Secondary | ICD-10-CM | POA: Insufficient documentation

## 2022-09-17 DIAGNOSIS — E782 Mixed hyperlipidemia: Secondary | ICD-10-CM

## 2022-09-17 DIAGNOSIS — R944 Abnormal results of kidney function studies: Secondary | ICD-10-CM | POA: Insufficient documentation

## 2022-09-17 DIAGNOSIS — D72819 Decreased white blood cell count, unspecified: Secondary | ICD-10-CM

## 2022-09-17 LAB — CBC WITH DIFFERENTIAL/PLATELET
Basophils Absolute: 0 10*3/uL (ref 0.0–0.1)
Basophils Relative: 1 % (ref 0.0–3.0)
Eosinophils Absolute: 0.1 10*3/uL (ref 0.0–0.7)
Eosinophils Relative: 2.5 % (ref 0.0–5.0)
HCT: 34.9 % — ABNORMAL LOW (ref 36.0–46.0)
Hemoglobin: 12 g/dL (ref 12.0–15.0)
Lymphocytes Relative: 34.3 % (ref 12.0–46.0)
Lymphs Abs: 1.4 10*3/uL (ref 0.7–4.0)
MCHC: 34.5 g/dL (ref 30.0–36.0)
MCV: 86.3 fl (ref 78.0–100.0)
Monocytes Absolute: 0.3 10*3/uL (ref 0.1–1.0)
Monocytes Relative: 7.1 % (ref 3.0–12.0)
Neutro Abs: 2.2 10*3/uL (ref 1.4–7.7)
Neutrophils Relative %: 55.1 % (ref 43.0–77.0)
Platelets: 250 10*3/uL (ref 150.0–400.0)
RBC: 4.04 Mil/uL (ref 3.87–5.11)
RDW: 14.4 % (ref 11.5–15.5)
WBC: 3.9 10*3/uL — ABNORMAL LOW (ref 4.0–10.5)

## 2022-09-17 LAB — BASIC METABOLIC PANEL
BUN: 24 mg/dL — ABNORMAL HIGH (ref 6–23)
CO2: 31 mEq/L (ref 19–32)
Calcium: 9.7 mg/dL (ref 8.4–10.5)
Chloride: 102 mEq/L (ref 96–112)
Creatinine, Ser: 0.92 mg/dL (ref 0.40–1.20)
GFR: 64.53 mL/min (ref >60.00)
Glucose, Bld: 88 mg/dL (ref 70–99)
Potassium: 3.9 mEq/L (ref 3.5–5.1)
Sodium: 140 mEq/L (ref 135–145)

## 2022-09-17 LAB — HEMOGLOBIN A1C: Hgb A1c MFr Bld: 5.9 % (ref 4.6–6.5)

## 2022-09-17 MED ORDER — ATENOLOL 25 MG PO TABS: 12.5000 mg | ORAL_TABLET | Freq: Every day | ORAL | 3 refills | Status: DC

## 2022-09-17 NOTE — Patient Instructions (Addendum)
Return in about 6 months (around 03/15/2023) for cpe (20 min) if not already scheduled. Doristine Devoid to see you today.  I have refilled the medication(s) we provide.   If labs were collected, we will inform you of lab results once received either by echart message or telephone call.   - echart message- for normal results that have been seen by the patient already.   - telephone call: abnormal results or if patient has not viewed results in their echart.

## 2022-09-17 NOTE — Progress Notes (Signed)
Patient ID: Leah Perkins, female  DOB: [birth year], 67 y.o.   MRN: [id number] Patient Care Team    Relationship Specialty Notifications Start End  Ma Hillock, DO PCP - General Family Medicine  02/13/19   Nancy Marus, MD  Gynecologic Oncology  12/26/11   Janie Morning, MD Attending Physician Obstetrics and Gynecology  02/13/19   Lyndal Pulley, [place] Physician Sports Medicine  02/13/19   Erroll Luna, MD Consulting Physician General Surgery  10/13/21   Lahoma Crocker, MD Consulting Physician Obstetrics and Gynecology  10/13/21     Chief Complaint  Patient presents with   Medical Management of Chronic Issues    Pt is not fasting    Subjective:  Leah Perkins is a 67 y.o.  Female  present for Chronic Conditions/illness Management  All past medical history, surgical history, allergies, family history, immunizations, medications and social history were updated in the electronic medical record today. All recent labs, ED visits and hospitalizations within the last year were reviewed.  Hypertension/HLD:  Pt reports compliance with atenolol 12.5 mg qd. Blood pressures ranges at home WNL.  Patient denies chest pain, shortness of breath, dizziness or lower extremity edema.  .  Diet: low sodium Exercise: routine RF: htn, hld, overweight, fhx.      09/09/2022    9:39 AM 08/20/2021   11:42 AM 03/11/2021    9:00 AM 08/14/2020    8:35 AM 08/15/2019   10:02 AM  Depression screen PHQ 2/9  Decreased Interest 0 0 0 0 0  Down, Depressed, Hopeless 0 0 0 0 0  PHQ - 2 Score 0 0 0 0 0       No data to display           Immunization History  Administered Date(s) Administered   Fluad Quad(high Dose 65+) 08/14/2020, 03/11/2021   Influenza Split 04/26/2017   Influenza,inj,Quad PF,6+ Mos 05/02/2019   Influenza-Unspecified 04/27/2018   PFIZER(Purple Top)SARS-COV-2 Vaccination 07/24/2019, 08/12/2019, 03/05/2020   PNEUMOCOCCAL CONJUGATE-20 09/09/2021   Pneumococcal  Conjugate-13 08/14/2020   Tdap 07/13/2013   Zoster Recombinat (Shingrix) 02/13/2019, 05/16/2019     Past Medical History:  Diagnosis Date   Allergy    Basal cell carcinoma of face 09/09/2021   Chicken pox    Genetic testing 12/16/2017   Hypertension    Inflamed seborrheic keratosis 09/09/2021   Ovarian cancer ([place]) 09/2011   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 9/13,  Followed by Dr Gehrig/Dr Glennon Mac- Laurance Flatten   Patellar contusion, right, initial encounter 03/21/2018   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   UTI (urinary tract infection)    Patient denies this dx   Allergies  Allergen Reactions   Lisinopril Other (See Comments)    Decreased renal fx while on- resolved when dc'd   Sulfa Antibiotics Other (See Comments)    Unknown childhood reaction   Past Surgical History:  Procedure Laterality Date   ABDOMINAL HYSTERECTOMY  09/2011   APPENDECTOMY  2009   w/ hysterectomy   COLONOSCOPY     DEBULKING  09/2011   DILATION AND CURETTAGE OF UTERUS  1996   HAMMER TOE SURGERY Right 2017   HERNIA REPAIR  02/08/20   INGUINAL HERNIA REPAIR Right 02/08/2020   Procedure: REPAIR RIGHT INGUINAL HERNIA WITH MESH;  Surgeon: Erroll Luna, MD;  Location: [facility];  Service: General;  Laterality: Right;  GENERAL AND TAP BLOCK   KNEE ARTHROSCOPY Right 2009   Knee   LAPAROSCOPIC  LYSIS OF ADHESIONS N/A 10/17/2021   Procedure: LAPAROSCOPIC LYSIS OF ADHESIONS;  Surgeon: Greer Pickerel, MD;  Location: [facility];  Service: General;  Laterality: N/A;  30 minutes    LAPAROSCOPY N/A 10/17/2021   Procedure: LAPAROSCOPY DIAGNOSTIC;  Surgeon: Greer Pickerel, MD;  Location: [facility];  Service: General;  Laterality: N/A;   TONSILLECTOMY  1991   TOTAL ABDOMINAL HYSTERECTOMY W/ BILATERAL SALPINGOOPHORECTOMY  09/2011   TAHBSO with appendectomy and optimal tumor debulking in [place]   Family History  Problem Relation Age of Onset   Hypertension Mother     Hypertrophic cardiomyopathy Mother 91       pt negativ rofr gene August 15, 2012   Early death Mother    Alzheimer's disease Father    Cancer Brother        Squamous cell of sinus, hx smok inhalation Engineer, drilling)   Early death Brother    Heart attack Brother    Lymphoma Paternal Uncle        late 50's/early 49's   Alzheimer's disease Paternal Grandmother    Heart attack Paternal Grandfather 53   Miscarriages / Stillbirths Daughter    Hypertrophic cardiomyopathy Son    Early death Son    Early death Son    Colon cancer Neg Hx    Breast cancer Neg Hx    Ovarian cancer Neg Hx    Endometrial cancer Neg Hx    Pancreatic cancer Neg Hx    Prostate cancer Neg Hx    Social History   Social History Narrative   Marital status/children/pets: Married, 3 children. Has grandchildren   Education/employment: A.A.S., retired   Engineer, materials:      -Wears a bicycle helmet riding a bike: Yes     -smoke alarm in the home:Yes     - wears seatbelt: Yes     - Feels safe in their relationships: Yes    Allergies as of 09/17/2022       Reactions   Lisinopril Other (See Comments)   Decreased renal fx while on- resolved when dc'd   Sulfa Antibiotics Other (See Comments)   Unknown childhood reaction        Medication List        Accurate as of September 17, 2022 11:31 AM. If you have any questions, ask your nurse or doctor.          STOP taking these medications    acetaminophen 500 MG tablet Commonly known as: TYLENOL Stopped by: Howard Pouch, DO       TAKE these medications    ascorbic acid 500 MG tablet Commonly known as: VITAMIN C Take 500 mg by mouth 2 (two) times daily.   atenolol 25 MG tablet Commonly known as: TENORMIN Take 0.5 tablets (12.5 mg total) by mouth daily.   CALCIUM 600 + D PO Take 1 tablet by mouth in the morning and at bedtime.   multivitamin with minerals tablet Take 1 tablet by mouth at bedtime.        All past medical history, surgical history, allergies,  family history, immunizations andmedications were updated in the EMR today and reviewed under the history and medication portions of their EMR.     No results found for this or any previous visit (from the past 2160 hour(s)).  DG Abd Portable 1V  Result Date: 10/14/2021  IMPRESSION: Persistent mildly dilated loops of small bowel within the abdomen measuring up to 3.2 cm in diameter, similar to the previous  study. Continued radiographic follow-up recommended.  DG Abd Portable 1V  Result Date: 10/13/2021 CLINICAL DATA:  Acute aortic syndrome (AAS) suspected. Chest pain, back pain, left upper abdominal pain EXAM: CT ANGIOGRAPHY CHEST, ABDOMEN AND PELVIS TECHNIQUE: Non-contrast CT of the chest was IMPRESSION: No evidence of thoracoabdominal aortic aneurysm or dissection. Mid small bowel obstruction with a single point transition within the mid jejunum within the periumbilical region anteriorly. Mild ascites. No free intraperitoneal gas. No evidence of bowel ischemia at this time. Electronically Signed   By: Fidela Salisbury M.D.   On: 10/13/2021 19:17   DG Chest 2 View Result Date: 10/13/2021 IMPRESSION: No active disease. Tortuous aorta, as might be seen with a history of hypertension.    ROS 14 pt review of systems performed and negative (unless mentioned in an HPI)  Objective: BP (!) 139/91   Pulse 63   Temp (!) 97.5 F (36.4 C)   Ht '5\' 6"'$  (1.676 m)   Wt 154 lb 3.2 oz (69.9 kg)   LMP  (LMP Unknown)   SpO2 99%   BMI 24.89 kg/m  Physical Exam Vitals and nursing note reviewed.  Constitutional:      General: She is not in acute distress.    Appearance: Normal appearance. She is not ill-appearing or toxic-appearing.  HENT:     Head: Normocephalic and atraumatic.     Right Ear: Tympanic membrane, ear canal and external ear normal. There is no impacted cerumen.     Left Ear: Tympanic membrane, ear canal and external ear normal. There is no impacted cerumen.     Nose: No congestion or  rhinorrhea.     Mouth/Throat:     Mouth: Mucous membranes are moist.     Pharynx: Oropharynx is clear. No oropharyngeal exudate or posterior oropharyngeal erythema.  Eyes:     General: No scleral icterus.       Right eye: No discharge.        Left eye: No discharge.     Extraocular Movements: Extraocular movements intact.     Conjunctiva/sclera: Conjunctivae normal.     Pupils: Pupils are equal, round, and reactive to light.  Neck:     Comments: Left submandibular lymphadenopathy x1.  Nontender.  Quarter size hard nodule palpated. Cardiovascular:     Rate and Rhythm: Normal rate and regular rhythm.     Pulses: Normal pulses.     Heart sounds: Normal heart sounds. No murmur heard.    No friction rub. No gallop.  Pulmonary:     Effort: Pulmonary effort is normal. No respiratory distress.     Breath sounds: Normal breath sounds. No stridor. No wheezing, rhonchi or rales.  Chest:     Chest wall: No tenderness.  Abdominal:     General: Abdomen is flat. Bowel sounds are normal. There is no distension.     Palpations: Abdomen is soft. There is no mass.     Tenderness: There is no abdominal tenderness. There is no right CVA tenderness, left CVA tenderness, guarding or rebound.     Hernia: No hernia is present.  Musculoskeletal:        General: No swelling, tenderness or deformity. Normal range of motion.     Cervical back: Normal range of motion and neck supple. No rigidity or tenderness.     Right lower leg: No edema.     Left lower leg: No edema.  Lymphadenopathy:     Cervical: No cervical adenopathy.  Skin:    General: Skin  is warm and dry.     Coloration: Skin is not jaundiced or pale.     Findings: No bruising, erythema, lesion or rash.  Neurological:     General: No focal deficit present.     Mental Status: She is alert and oriented to person, place, and time. Mental status is at baseline.     Cranial Nerves: No cranial nerve deficit.     Sensory: No sensory deficit.      Motor: No weakness.     Coordination: Coordination normal.     Gait: Gait normal.     Deep Tendon Reflexes: Reflexes normal.  Psychiatric:        Mood and Affect: Mood normal.        Behavior: Behavior normal.        Thought Content: Thought content normal.        Judgment: Judgment normal.       No results found.  Assessment/plan: WYNN WINDERS is a 67 y.o. female present for Chronic Conditions/illness Management Essential hypertension/HLD Stable  Continue  atenolol 12.5 mg qd 130/82 at home this morning.  Continue to monitor at home and if > 140/90 routinely, then follow up sooner.   Decreased gfr: Last visit mild change in GFR Bmp collected today  Elevated A1c: A1c collected today (6.0 last visit)  Lymphadenopathy/Family history of lymphoma/decreased WBC: Enlarged lymph node left submandibular region palpated and still present, no larger. No changes CBC with differential rechecked today d/t mildly low wbc last visit.  Neck ultrasound completed 03/14/2022> Normal. Unchanged.  Return in about 6 months (around 03/15/2023) for cpe (20 min) if not already scheduled. .   Orders Placed This Encounter  Procedures   Basic metabolic panel   CBC w/Diff   Hemoglobin A1c   Meds ordered this encounter  Medications   atenolol (TENORMIN) 25 MG tablet    Sig: Take 0.5 tablets (12.5 mg total) by mouth daily.    Dispense:  45 tablet    Refill:  3   Referral Orders  No referral(s) requested today     Electronically signed by: Howard Pouch, [place]

## 2022-10-09 ENCOUNTER — Ambulatory Visit
Admission: RE | Admit: 2022-10-09 | Discharge: 2022-10-09 | Disposition: A | Discharge status: Home / Self Care | Payer: Medicare HMO | Source: Ambulatory Visit | Attending: Family Medicine | Admitting: Family Medicine

## 2022-10-09 DIAGNOSIS — Z1231 Encounter for screening mammogram for malignant neoplasm of breast: Secondary | ICD-10-CM

## 2022-10-09 DIAGNOSIS — Z1371 Encounter for nonprocreative screening for genetic disease carrier status: Secondary | ICD-10-CM

## 2022-10-09 DIAGNOSIS — C569 Malignant neoplasm of unspecified ovary: Secondary | ICD-10-CM

## 2023-02-25 ENCOUNTER — Encounter (INDEPENDENT_AMBULATORY_CARE_PROVIDER_SITE_OTHER): Payer: Self-pay

## 2023-02-26 ENCOUNTER — Encounter: Payer: Self-pay | Admitting: Family Medicine

## 2023-02-26 ENCOUNTER — Telehealth: Payer: Self-pay

## 2023-02-26 ENCOUNTER — Ambulatory Visit: Payer: Medicare HMO | Admitting: Family Medicine

## 2023-02-26 VITALS — BP 130/88 | HR 90 | Temp 97.7°F | Wt 151.8 lb

## 2023-02-26 DIAGNOSIS — Z23 Encounter for immunization: Secondary | ICD-10-CM | POA: Diagnosis not present

## 2023-02-26 DIAGNOSIS — I10 Essential (primary) hypertension: Secondary | ICD-10-CM | POA: Diagnosis not present

## 2023-02-26 MED ORDER — AMLODIPINE BESYLATE 2.5 MG PO TABS: 2.5000 mg | ORAL_TABLET | Freq: Every day | ORAL | 1 refills | Status: DC

## 2023-02-26 MED ORDER — AMLODIPINE BESYLATE 2.5 MG PO TABS: 2.5000 mg | ORAL_TABLET | Freq: Every day | ORAL | 0 refills | Status: DC

## 2023-02-26 NOTE — Progress Notes (Signed)
Patient ID: Leah Perkins, female  DOB: 09-May-1956, 68 y.o.   MRN: [id number] Patient Care Team    Relationship Specialty Notifications Start End  Natalia Leatherwood, DO PCP - General Family Medicine  02/13/19   Cleda Mccreedy, MD  Gynecologic Oncology  12/26/11   Laurette Schimke, MD Attending Physician Obstetrics and Gynecology  02/13/19   Judi Saa, DO Consulting Physician Sports Medicine  02/13/19   Harriette Bouillon, MD Consulting Physician General Surgery  10/13/21   Antionette Char, MD Consulting Physician Obstetrics and Gynecology  10/13/21     Chief Complaint  Patient presents with   Hypertension    If needing Rx please print    Subjective:  Leah Perkins is a 67 y.o.  Female  present for elevation of blood pressure yesterday as high as 200/116.  She reports she woke up in the morning with a headache.  The night prior she was having difficulty sleeping because she could not get comfortable.  She states her headache was on both sides of her head and she felt fatigued.  She did go for her normal walk, but came back early secondary to feeling tired.  She did take a Tylenol which did not take the headache away and then ibuprofen.  She then checked her blood pressure thinking it was possibly the cause of her headache and her blood pressure was 140/90.  Each time she got up to check her blood pressure throughout the day it continued to rise.  When she noted the blood pressure of 200/116 she called the nurse triage line. Patient reports she had no associated symptoms such as dizziness, blurred vision, speech changes, unilateral weakness etc. She was seen in the office a few months ago with a borderline blood pressure and her home pressures are typically not checked.  Pt reports compliance with atenolol 12.5 mg qd.  Patient does not ever recall having a history of palpitations. Patient denies chest pain, shortness of breath, dizziness or lower extremity edema.     Diet: low  sodium Exercise: routine RF: htn, hld, overweight, fhx.      09/17/2022    9:37 AM 09/09/2022    9:39 AM 08/20/2021   11:42 AM 03/11/2021    9:00 AM 08/14/2020    8:35 AM  Depression screen PHQ 2/9  Decreased Interest 0 0 0 0 0  Down, Depressed, Hopeless 0 0 0 0 0  PHQ - 2 Score 0 0 0 0 0       No data to display           Immunization History  Administered Date(s) Administered   Fluad Quad(high Dose 65+) 08/14/2020, 03/11/2021   Influenza Split 04/26/2017   Influenza,inj,Quad PF,6+ Mos 05/02/2019   Influenza-Unspecified 04/27/2018   PFIZER(Purple Top)SARS-COV-2 Vaccination 07/24/2019, 08/12/2019, 03/05/2020   PNEUMOCOCCAL CONJUGATE-20 09/09/2021   Pneumococcal Conjugate-13 08/14/2020   Tdap 07/13/2013   Zoster Recombinant(Shingrix) 02/13/2019, 05/16/2019     Past Medical History:  Diagnosis Date   Allergy    Basal cell carcinoma of face 09/09/2021   Chicken pox    Genetic testing 12/16/2017   Hypertension    Inflamed seborrheic keratosis 09/09/2021   Ovarian cancer ([facility]) 09/2011   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 9/13,  Followed by Dr Gehrig/Dr Jean RosenthalChristell Constant   Patellar contusion, right, initial encounter 03/21/2018   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   UTI (urinary tract infection)    Patient denies this dx  Allergies  Allergen Reactions   Lisinopril Other (See Comments)    Decreased renal fx while on- resolved when dc'd   Sulfa Antibiotics Other (See Comments)    Unknown childhood reaction   Past Surgical History:  Procedure Laterality Date   ABDOMINAL HYSTERECTOMY  09/2011   APPENDECTOMY  2009   w/ hysterectomy   COLONOSCOPY     DEBULKING  09/2011   DILATION AND CURETTAGE OF UTERUS  1996   HAMMER TOE SURGERY Right 2017   HERNIA REPAIR  02/08/20   INGUINAL HERNIA REPAIR Right 02/08/2020   Procedure: REPAIR RIGHT INGUINAL HERNIA WITH MESH;  Surgeon: Harriette Bouillon, MD;  Location: [facility];  Service: General;   Laterality: Right;  GENERAL AND TAP BLOCK   KNEE ARTHROSCOPY Right 2009   Knee   LAPAROSCOPIC LYSIS OF ADHESIONS N/A 10/17/2021   Procedure: LAPAROSCOPIC LYSIS OF ADHESIONS;  Surgeon: Gaynelle Adu, MD;  Location: [facility];  Service: General;  Laterality: N/A;  30 minutes    LAPAROSCOPY N/A 10/17/2021   Procedure: LAPAROSCOPY DIAGNOSTIC;  Surgeon: Gaynelle Adu, MD;  Location: [facility];  Service: General;  Laterality: N/A;   TONSILLECTOMY  1991   TOTAL ABDOMINAL HYSTERECTOMY W/ BILATERAL SALPINGOOPHORECTOMY  09/2011   TAHBSO with appendectomy and optimal tumor debulking in [place]   WISDOM TOOTH EXTRACTION  1985   Family History  Problem Relation Age of Onset   Hypertension Mother    Hypertrophic cardiomyopathy Mother 60       pt negativ rofr gene 24-Aug-2012   Early death Mother    Alzheimer's disease Father    Cancer Brother        Squamous cell of sinus, hx smok inhalation Public house manager)   Early death Brother    Heart attack Brother    Lymphoma Paternal Uncle        late 50's/early 73's   Alzheimer's disease Paternal Grandmother    Heart attack Paternal Grandfather 28   Miscarriages / Stillbirths Daughter    Hypertrophic cardiomyopathy Son    Early death Son    Early death Son    Colon cancer Neg Hx    Breast cancer Neg Hx    Ovarian cancer Neg Hx    Endometrial cancer Neg Hx    Pancreatic cancer Neg Hx    Prostate cancer Neg Hx    Social History   Social History Narrative   Marital status/children/pets: Married, 3 children. Has grandchildren   Education/employment: A.A.S., retired   Field seismologist:      -Wears a bicycle helmet riding a bike: Yes     -smoke alarm in the home:Yes     - wears seatbelt: Yes     - Feels safe in their relationships: Yes    Allergies as of 02/26/2023       Reactions   Lisinopril Other (See Comments)   Decreased renal fx while on- resolved when dc'd   Sulfa Antibiotics Other (See Comments)   Unknown childhood reaction        Medication List         Accurate as of February 26, 2023 10:29 AM. If you have any questions, ask your nurse or doctor.          STOP taking these medications    atenolol 25 MG tablet Commonly known as: TENORMIN Stopped by: Felix Pacini       TAKE these medications    amLODipine 2.5 MG tablet Commonly known as: NORVASC Take 1 tablet (2.5 mg total) by mouth daily.  Started by: Felix Pacini   amLODipine 2.5 MG tablet Commonly known as: NORVASC Take 1 tablet (2.5 mg total) by mouth daily. Started by: Felix Pacini   ascorbic acid 500 MG tablet Commonly known as: VITAMIN C Take 500 mg by mouth 2 (two) times daily.   CALCIUM 600 + D PO Take 1 tablet by mouth in the morning and at bedtime.   multivitamin with minerals tablet Take 1 tablet by mouth at bedtime.        All past medical history, surgical history, allergies, family history, immunizations andmedications were updated in the EMR today and reviewed under the history and medication portions of their EMR.     No results found for this or any previous visit (from the past 2160 hour(s)).  DG Abd Portable 1V  Result Date: 10/14/2021  IMPRESSION: Persistent mildly dilated loops of small bowel within the abdomen measuring up to 3.2 cm in diameter, similar to the previous study. Continued radiographic follow-up recommended.  DG Abd Portable 1V  Result Date: 10/13/2021 CLINICAL DATA:  Acute aortic syndrome (AAS) suspected. Chest pain, back pain, left upper abdominal pain EXAM: CT ANGIOGRAPHY CHEST, ABDOMEN AND PELVIS TECHNIQUE: Non-contrast CT of the chest was IMPRESSION: No evidence of thoracoabdominal aortic aneurysm or dissection. Mid small bowel obstruction with a single point transition within the mid jejunum within the periumbilical region anteriorly. Mild ascites. No free intraperitoneal gas. No evidence of bowel ischemia at this time. Electronically Signed   By: Helyn Numbers M.D.   On: 10/13/2021 19:17   DG Chest 2 View Result Date:  10/13/2021 IMPRESSION: No active disease. Tortuous aorta, as might be seen with a history of hypertension.    ROS 14 pt review of systems performed and negative (unless mentioned in an HPI)  Objective: BP 130/88   Pulse 90   Temp 97.7 F (36.5 C)   Wt 151 lb 12.8 oz (68.9 kg)   LMP  (LMP Unknown)   SpO2 97%   BMI 24.50 kg/m  Physical Exam Vitals and nursing note reviewed.  Constitutional:      General: She is not in acute distress.    Appearance: Normal appearance. She is not ill-appearing, toxic-appearing or diaphoretic.  HENT:     Head: Normocephalic and atraumatic.  Eyes:     General: No scleral icterus.       Right eye: No discharge.        Left eye: No discharge.     Extraocular Movements: Extraocular movements intact.     Conjunctiva/sclera: Conjunctivae normal.     Pupils: Pupils are equal, round, and reactive to light.  Cardiovascular:     Rate and Rhythm: Normal rate and regular rhythm.     Heart sounds: No murmur heard. Pulmonary:     Effort: Pulmonary effort is normal. No respiratory distress.     Breath sounds: Normal breath sounds. No wheezing, rhonchi or rales.  Musculoskeletal:     Right lower leg: No edema.     Left lower leg: No edema.  Skin:    General: Skin is warm.     Findings: No rash.  Neurological:     Mental Status: She is alert and oriented to person, place, and time. Mental status is at baseline.     Motor: No weakness.     Gait: Gait normal.  Psychiatric:        Mood and Affect: Mood normal.        Behavior: Behavior normal.  Thought Content: Thought content normal.        Judgment: Judgment normal.      No results found.  Assessment/plan: ALIZEE KECKLER is a 67 y.o. female present for Chronic Conditions/illness Management Essential hypertension/HLD Borderline blood pressures last 2 office visits. Discontinue atenolol 12.5 mg daily.  No prior history of palpitations and she has a heart rate below 60. Add amlodipine 2.5 mg  daily for better blood pressure control. Tried medications: Lisinopril-AKI She has close follow-up next month already scheduled.  Return if symptoms worsen or fail to improve.   No orders of the defined types were placed in this encounter.  Meds ordered this encounter  Medications   amLODipine (NORVASC) 2.5 MG tablet    Sig: Take 1 tablet (2.5 mg total) by mouth daily.    Dispense:  90 tablet    Refill:  1   amLODipine (NORVASC) 2.5 MG tablet    Sig: Take 1 tablet (2.5 mg total) by mouth daily.    Dispense:  30 tablet    Refill:  0   Referral Orders  No referral(s) requested today     Electronically signed by: Felix Pacini, DO [facility] [place]

## 2023-02-26 NOTE — Patient Instructions (Signed)

## 2023-02-26 NOTE — Telephone Encounter (Signed)
Pt was seen by PCP  Leah Perkins [facility] Day - Client TELEPHONE ADVICE RECORD [facility] Patient Name First: Leah Last: Perkins Gender: Female DOB: [birth year] Age: 67 Y 8 M 22 D Return Phone Number: [phone] (Primary) Address: City/State/Zip:[place] [address] Client [place] [facility] Day - Client Client Site [place] [facility] [place] - Day Provider [place], [place] Contact Type Call Who Is Calling Patient / Member / Family / Caregiver Call Type Triage / Clinical Relationship To Patient Self Return Phone Number [phone] (Primary) Chief Complaint BLOOD PRESSURE HIGH - Systolic (top number) 200 or greater Reason for Call Symptomatic / Request for Health Information Initial Comment Caller states her BP is 200/116. She has a headache and is shaking. Translation No Nurse Assessment Nurse: [name], RN, [name] Date/Time [name] Time): 02/25/2023 1:53:26 PM Confirm and document reason for call. If symptomatic, describe symptoms. ---Caller states her BP is 200/116. She has a headache and is shaking. Took her atenolol 25mg  1/2 tab this a.m. and Tylenol/IBU. 140/95. 174/110. Does the patient have any new or worsening symptoms? ---Yes Will a triage be completed? ---Yes Related visit to physician within the last 2 weeks? ---No Does the PT have any chronic conditions? (i.e. diabetes, asthma, this includes High risk factors for pregnancy, etc.) ---Yes List chronic conditions. ---HTN, ovarian CA survivor Is this a behavioral health or substance abuse call? ---No Guidelines Guideline Title Affirmed Question Affirmed Notes Nurse Date/Time (Eastern Time) Blood Pressure - High Systolic BP >= 180 OR Diastolic >= [address] [name] 02/25/2023 1:56:03 PM Disp. Time [name] Time) Disposition Final User 02/25/2023 1:49:56 PM Send to Urgent [name] 02/25/2023 1:58:45 PM See PCP within 24 Hours Yes [name], RN, [name] PLEASE NOTE:  All timestamps contained within this report are represented as [place] Standard Time. CONFIDENTIALTY NOTICE: This fax transmission is intended only for the addressee. It contains information that is legally privileged, confidential or otherwise protected from use or disclosure. If you are not the intended recipient, you are strictly prohibited from reviewing, disclosing, copying using or disseminating any of this information or taking any action in reliance on or regarding this information. If you have received this fax in error, please notify us immediately by telephone so that we can arrange for its return to [place]. Phone: [phone], Toll-Free: [phone], Fax: [phone] Page: 2 of 2 Call Id: [id number] Final Disposition 02/25/2023 1:58:45 PM See PCP within 24 Hours Yes [name], RN, [name] Disagree/Comply Comply Caller Understands Yes PreDisposition Call Doctor Perkins Advice Given Per Guideline SEE PCP WITHIN 24 HOURS: * IF OFFICE WILL BE OPEN: You need to be examined within the next 24 hours. Call your doctor (or NP/PA) when the office opens and make an appointment. CALL BACK IF: * Chest pain or difficulty breathing occurs * Difficulty walking, difficulty talking, or severe headache occurs * Weakness or numbness of the face, arm or leg on one side of the body occurs * You become worse Perkins ADVICE given per High Blood Pressure (Adult) guideline. Comments User: [name], RN Date/Time [name] Time): 02/25/2023 2:05:23 PM call disconnected from backline, transferred x3. Had to call office backline again. placed on hold Referrals REFERRED TO PCP OFFICE

## 2023-03-10 ENCOUNTER — Telehealth: Payer: Self-pay

## 2023-03-10 MED ORDER — AMLODIPINE BESYLATE 2.5 MG PO TABS
2.5000 mg | ORAL_TABLET | Freq: Every day | ORAL | 1 refills | Status: DC
Start: 2023-03-10 — End: 2023-08-06

## 2023-03-10 NOTE — Telephone Encounter (Signed)
Prescription Request  03/10/2023  LOV: Visit date not found  What is the name of the medication or equipment? amLODipine (NORVASC) 2.5 MG tablet   Have you contacted your pharmacy to request a refill? No this is a new script; was placed on file until pt found out prices of meds with a few pharmacies. [facility] is cheaper.  Which pharmacy would you like this sent to?  [facility] - [place], [place] - [address] [place] [address] Phone: [phone] Fax: [phone]  [facility] - [place], [place] - [address] [place] [address] Phone: [phone] Fax: [phone]  [facility] [phone] - [place], [place] - [address] [place] [address] Phone: [phone] Fax: [phone]    Patient notified that their request is being sent to the clinical staff for review and that they should receive a response within 2 business days.   Please advise at Mobile [phone] (mobile)

## 2023-03-19 ENCOUNTER — Encounter: Payer: Medicare HMO | Admitting: Family Medicine

## 2023-03-25 ENCOUNTER — Encounter: Payer: Medicare HMO | Admitting: Family Medicine

## 2023-04-02 ENCOUNTER — Encounter: Payer: Self-pay | Admitting: Obstetrics & Gynecology

## 2023-04-05 ENCOUNTER — Encounter: Payer: Self-pay | Admitting: Family Medicine

## 2023-04-05 ENCOUNTER — Ambulatory Visit (INDEPENDENT_AMBULATORY_CARE_PROVIDER_SITE_OTHER): Payer: Medicare HMO | Admitting: Family Medicine

## 2023-04-05 VITALS — BP 128/80 | HR 67 | Temp 97.7°F | Ht 66.0 in | Wt 154.6 lb

## 2023-04-05 DIAGNOSIS — R7309 Other abnormal glucose: Secondary | ICD-10-CM | POA: Diagnosis not present

## 2023-04-05 DIAGNOSIS — E2839 Other primary ovarian failure: Secondary | ICD-10-CM | POA: Diagnosis not present

## 2023-04-05 DIAGNOSIS — E782 Mixed hyperlipidemia: Secondary | ICD-10-CM

## 2023-04-05 DIAGNOSIS — Z1231 Encounter for screening mammogram for malignant neoplasm of breast: Secondary | ICD-10-CM

## 2023-04-05 DIAGNOSIS — Z0000 Encounter for general adult medical examination without abnormal findings: Secondary | ICD-10-CM

## 2023-04-05 DIAGNOSIS — R944 Abnormal results of kidney function studies: Secondary | ICD-10-CM

## 2023-04-05 DIAGNOSIS — Z23 Encounter for immunization: Secondary | ICD-10-CM

## 2023-04-05 DIAGNOSIS — I10 Essential (primary) hypertension: Secondary | ICD-10-CM | POA: Diagnosis not present

## 2023-04-05 LAB — COMPREHENSIVE METABOLIC PANEL
ALT: 17 U/L (ref 0–35)
AST: 22 U/L (ref 0–37)
Albumin: 4.2 g/dL (ref 3.5–5.2)
Alkaline Phosphatase: 53 U/L (ref 39–117)
BUN: 22 mg/dL (ref 6–23)
CO2: 30 mEq/L (ref 19–32)
Calcium: 9.9 mg/dL (ref 8.4–10.5)
Chloride: 103 mEq/L (ref 96–112)
Creatinine, Ser: 0.94 mg/dL (ref 0.40–1.20)
GFR: 62.65 mL/min (ref >60.00)
Glucose, Bld: 94 mg/dL (ref 70–99)
Potassium: 4.5 mEq/L (ref 3.5–5.1)
Sodium: 140 mEq/L (ref 135–145)
Total Bilirubin: 1 mg/dL (ref 0.2–1.2)
Total Protein: 6.8 g/dL (ref 6.0–8.3)

## 2023-04-05 LAB — LIPID PANEL
Cholesterol: 236 mg/dL — ABNORMAL HIGH (ref 0–200)
HDL: 93.8 mg/dL (ref >39.00)
LDL Cholesterol: 132 mg/dL — ABNORMAL HIGH (ref 0–99)
NonHDL: 142.39
Total CHOL/HDL Ratio: 3
Triglycerides: 50 mg/dL (ref 0.0–149.0)
VLDL: 10 mg/dL (ref 0.0–40.0)

## 2023-04-05 LAB — CBC WITH DIFFERENTIAL/PLATELET
Basophils Absolute: 0 10*3/uL (ref 0.0–0.1)
Basophils Relative: 1.1 % (ref 0.0–3.0)
Eosinophils Absolute: 0.2 10*3/uL (ref 0.0–0.7)
Eosinophils Relative: 6 % — ABNORMAL HIGH (ref 0.0–5.0)
HCT: 41 % (ref 36.0–46.0)
Hemoglobin: 13.5 g/dL (ref 12.0–15.0)
Lymphocytes Relative: 43.4 % (ref 12.0–46.0)
Lymphs Abs: 1.5 10*3/uL (ref 0.7–4.0)
MCHC: 32.8 g/dL (ref 30.0–36.0)
MCV: 87.8 fl (ref 78.0–100.0)
Monocytes Absolute: 0.3 10*3/uL (ref 0.1–1.0)
Monocytes Relative: 8.9 % (ref 3.0–12.0)
Neutro Abs: 1.4 10*3/uL (ref 1.4–7.7)
Neutrophils Relative %: 40.6 % — ABNORMAL LOW (ref 43.0–77.0)
Platelets: 276 10*3/uL (ref 150.0–400.0)
RBC: 4.67 Mil/uL (ref 3.87–5.11)
RDW: 15.2 % (ref 11.5–15.5)
WBC: 3.4 10*3/uL — ABNORMAL LOW (ref 4.0–10.5)

## 2023-04-05 LAB — TSH: TSH: 2.08 u[IU]/mL (ref 0.35–5.50)

## 2023-04-05 LAB — HEMOGLOBIN A1C: Hgb A1c MFr Bld: 5.8 % (ref 4.6–6.5)

## 2023-04-05 NOTE — Progress Notes (Signed)
Patient ID: Leah Perkins, female  DOB: 06-03-1956, 67 y.o.   MRN: [id number] Patient Care Team    Relationship Specialty Notifications Start End  Natalia Leatherwood, DO PCP - General Family Medicine  02/13/19   Cleda Mccreedy, MD  Gynecologic Oncology  12/26/11   Laurette Schimke, MD Attending Physician Obstetrics and Gynecology  02/13/19   Judi Saa, DO Consulting Physician Sports Medicine  02/13/19   Harriette Bouillon, MD Consulting Physician General Surgery  10/13/21   Antionette Char, MD Consulting Physician Obstetrics and Gynecology  10/13/21     Chief Complaint  Patient presents with   Annual Exam    Pt is fasting    Subjective:  Leah Perkins is a 67 y.o.  Female  present for CPE and Chronic Conditions/illness Management All past medical history, surgical history, allergies, family history, immunizations, medications and social history were updated in the electronic medical record today. All recent labs, ED visits and hospitalizations within the last year were reviewed.  Health maintenance:  Colon cancer: completed 2019, by Dr. Dulce Sellar, resutls "normal"  follow up 10 years Mammogram: completed:10/09/2022- [facility]> ordered placed for 2025 Cervical cancer screening: hysterectomy- for ovarian cancer>>continue to follow with gyn Immunizations: tdap 2015 UTD, Influenza completed today (encouraged yearly), shingrix completed. covid series completed Infectious disease screening: HIV and Hep C completed.  DEXA: completed 04/07/2016- WNL> ordered 09/2023 [facility] Patient has a Dental home. Hospitalizations/ED visits: reviewed  Hypertension/HLD:  Pt reports compliance with amlodipine 2.5 mg daily. Patient denies chest pain, shortness of breath, dizziness or lower extremity edema.  Patient did experience fluctuations in blood pressure 4 weeks ago.  Atenolol was discontinued and amlodipine was started. Diet: low sodium Exercise: routine RF: htn, hld, overweight, fhx.      04/05/2023     7:49 AM 09/17/2022    9:37 AM 09/09/2022    9:39 AM 08/20/2021   11:42 AM 03/11/2021    9:00 AM  Depression screen PHQ 2/9  Decreased Interest 0 0 0 0 0  Down, Depressed, Hopeless 0 0 0 0 0  PHQ - 2 Score 0 0 0 0 0       No data to display           Immunization History  Administered Date(s) Administered   Fluad Quad(high Dose 65+) 08/14/2020, 03/11/2021   Fluad Trivalent(High Dose 65+) 04/05/2023   Influenza Split 04/26/2017   Influenza,inj,Quad PF,6+ Mos 05/02/2019   Influenza-Unspecified 04/27/2018   PFIZER(Purple Top)SARS-COV-2 Vaccination 07/24/2019, 08/12/2019, 03/05/2020   PNEUMOCOCCAL CONJUGATE-20 09/09/2021   Pneumococcal Conjugate-13 08/14/2020   Tdap 07/13/2013   Zoster Recombinant(Shingrix) 02/13/2019, 05/16/2019     Past Medical History:  Diagnosis Date   Allergy    Basal cell carcinoma of face 09/09/2021   Chicken pox    Genetic testing 12/16/2017   Hypertension    Inflamed seborrheic keratosis 09/09/2021   Ovarian cancer (HCC) 09/2011   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 9/13,  Followed by Dr Gehrig/Dr Jean Rosenthal- [facility]   Patellar contusion, right, initial encounter 03/21/2018   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   UTI (urinary tract infection)    Patient denies this dx   Allergies  Allergen Reactions   Lisinopril Other (See Comments)    Decreased renal fx while on- resolved when dc'd   Sulfa Antibiotics Other (See Comments)    Unknown childhood reaction   Past Surgical History:  Procedure Laterality Date   ABDOMINAL HYSTERECTOMY  09/2011  APPENDECTOMY  2009   w/ hysterectomy   Bowel obstruction repair     10/2021   COLONOSCOPY     DEBULKING  09/2011   DILATION AND CURETTAGE OF UTERUS  1996   HAMMER TOE SURGERY Right 2017   HERNIA REPAIR  02/08/20   INGUINAL HERNIA REPAIR Right 02/08/2020   Procedure: REPAIR RIGHT INGUINAL HERNIA WITH MESH;  Surgeon: Harriette Bouillon, MD;  Location: [facility] OR;  Service: General;   Laterality: Right;  GENERAL AND TAP BLOCK   KNEE ARTHROSCOPY Right 2009   Knee   LAPAROSCOPIC LYSIS OF ADHESIONS N/A 10/17/2021   Procedure: LAPAROSCOPIC LYSIS OF ADHESIONS;  Surgeon: Gaynelle Adu, MD;  Location: [facility];  Service: General;  Laterality: N/A;  30 minutes    LAPAROSCOPY N/A 10/17/2021   Procedure: LAPAROSCOPY DIAGNOSTIC;  Surgeon: Gaynelle Adu, MD;  Location: [facility];  Service: General;  Laterality: N/A;   TONSILLECTOMY  1991   TOTAL ABDOMINAL HYSTERECTOMY W/ BILATERAL SALPINGOOPHORECTOMY  09/2011   TAHBSO with appendectomy and optimal tumor debulking in [place]   WISDOM TOOTH EXTRACTION  1985   Family History  Problem Relation Age of Onset   Hypertension Mother    Hypertrophic cardiomyopathy Mother 41       pt negativ rofr gene 08/28/2012   Early death Mother    Alzheimer's disease Father    Cancer Brother        Squamous cell of sinus, hx smok inhalation Public house manager)   Early death Brother    Heart attack Brother    Lymphoma Paternal Uncle        late 50's/early 76's   Alzheimer's disease Paternal Grandmother    Heart attack Paternal Grandfather 40   Miscarriages / Stillbirths Daughter    Hypertrophic cardiomyopathy Son    Early death Son    Early death Son    Colon cancer Neg Hx    Breast cancer Neg Hx    Ovarian cancer Neg Hx    Endometrial cancer Neg Hx    Pancreatic cancer Neg Hx    Prostate cancer Neg Hx    Social History   Social History Narrative   Marital status/children/pets: Married, 3 children. Has grandchildren   Education/employment: [employer] retired   Field seismologist:      -Wears a bicycle helmet riding a bike: Yes     -smoke alarm in the home:Yes     - wears seatbelt: Yes     - Feels safe in their relationships: Yes    Allergies as of 04/05/2023       Reactions   Lisinopril Other (See Comments)   Decreased renal fx while on- resolved when dc'd   Sulfa Antibiotics Other (See Comments)   Unknown childhood reaction        Medication List         Accurate as of April 05, 2023  8:00 AM. If you have any questions, ask your nurse or doctor.          amLODipine 2.5 MG tablet Commonly known as: NORVASC Take 1 tablet (2.5 mg total) by mouth daily.   ascorbic acid 500 MG tablet Commonly known as: VITAMIN C Take 500 mg by mouth 2 (two) times daily.   CALCIUM 600 + D PO Take 1 tablet by mouth in the morning and at bedtime.   multivitamin with minerals tablet Take 1 tablet by mouth at bedtime.        All past medical history, surgical history, allergies, family history, immunizations andmedications were  updated in the EMR today and reviewed under the history and medication portions of their EMR.     No results found for this or any previous visit (from the past 2160 hour(s)).   ROS 14 pt review of systems performed and negative (unless mentioned in an HPI)  Objective: BP 128/80   Pulse 67   Temp 97.7 F (36.5 C)   Ht 5\' 6"  (1.676 m)   Wt 154 lb 9.6 oz (70.1 kg)   LMP  (LMP Unknown)   SpO2 99%   BMI 24.95 kg/m  Physical Exam Vitals and nursing note reviewed.  Constitutional:      General: She is not in acute distress.    Appearance: Normal appearance. She is not ill-appearing or toxic-appearing.  HENT:     Head: Normocephalic and atraumatic.     Right Ear: Tympanic membrane, ear canal and external ear normal. There is no impacted cerumen.     Left Ear: Tympanic membrane, ear canal and external ear normal. There is no impacted cerumen.     Nose: No congestion or rhinorrhea.     Mouth/Throat:     Mouth: Mucous membranes are moist.     Pharynx: Oropharynx is clear. No oropharyngeal exudate or posterior oropharyngeal erythema.  Eyes:     General: No scleral icterus.       Right eye: No discharge.        Left eye: No discharge.     Extraocular Movements: Extraocular movements intact.     Conjunctiva/sclera: Conjunctivae normal.     Pupils: Pupils are equal, round, and reactive to light.  Cardiovascular:      Rate and Rhythm: Normal rate and regular rhythm.     Pulses: Normal pulses.     Heart sounds: Normal heart sounds. No murmur heard.    No friction rub. No gallop.  Pulmonary:     Effort: Pulmonary effort is normal. No respiratory distress.     Breath sounds: Normal breath sounds. No stridor. No wheezing, rhonchi or rales.  Chest:     Chest wall: No tenderness.  Abdominal:     General: Abdomen is flat. Bowel sounds are normal. There is no distension.     Palpations: Abdomen is soft. There is no mass.     Tenderness: There is no abdominal tenderness. There is no right CVA tenderness, left CVA tenderness, guarding or rebound.     Hernia: No hernia is present.  Musculoskeletal:        General: No swelling, tenderness or deformity. Normal range of motion.     Cervical back: Normal range of motion and neck supple. No rigidity or tenderness.     Right lower leg: No edema.     Left lower leg: No edema.  Lymphadenopathy:     Cervical: No cervical adenopathy.  Skin:    General: Skin is warm and dry.     Coloration: Skin is not jaundiced or pale.     Findings: No bruising, erythema, lesion or rash.  Neurological:     General: No focal deficit present.     Mental Status: She is alert and oriented to person, place, and time. Mental status is at baseline.     Cranial Nerves: No cranial nerve deficit.     Sensory: No sensory deficit.     Motor: No weakness.     Coordination: Coordination normal.     Gait: Gait normal.     Deep Tendon Reflexes: Reflexes normal.  Psychiatric:  Mood and Affect: Mood normal.        Behavior: Behavior normal.        Thought Content: Thought content normal.        Judgment: Judgment normal.     No results found.  Assessment/plan: Leah Perkins is a 67 y.o. female present for CPE and Chronic Conditions/illness Management                                                                                                                                                                                                                                                                                                                                                       Essential hypertension/HLD/family history of cardiomyopathy Stable Continue amlodipine 2.5 mg daily CBC, CMP, TSH and lipids collected today  Routine general medical examination at a health care facility Patient was encouraged to exercise greater than 150 minutes a week. Patient was encouraged to choose a diet filled with fresh fruits and vegetables, and lean meats. AVS provided to patient today for education/recommendation on gender specific health and safety maintenance. Colonoscopy: completed 2019, by Dr. Dulce Sellar, resutls "normal"  follow up 10 years Mammogram: completed:10/09/2022- [facility]> ordered placed for 2025 Cervical cancer screening: hysterectomy- for ovarian cancer>>continue to follow with gyn Immunizations: tdap 2015 UTD, Influenza completed today (encouraged yearly), shingrix completed. covid series completed Infectious disease screening: HIV and Hep C completed.   Return in about 24 weeks (around 09/20/2023) for Routine chronic condition follow-up.   Orders Placed This Encounter  Procedures   MM 3D SCREENING MAMMOGRAM BILATERAL BREAST   DG Bone Density   Flu Vaccine Trivalent High Dose (Fluad)   CBC with Differential/Platelet   Comprehensive metabolic panel   Hemoglobin A1c   TSH   Lipid panel   No orders of the defined types were placed in  this encounter.  Referral Orders  No referral(s) requested today     Electronically signed by: Felix Pacini, DO [facility] [place]

## 2023-04-05 NOTE — Patient Instructions (Addendum)

## 2023-04-06 ENCOUNTER — Other Ambulatory Visit: Payer: Self-pay | Admitting: Gynecologic Oncology

## 2023-04-06 DIAGNOSIS — C569 Malignant neoplasm of unspecified ovary: Secondary | ICD-10-CM

## 2023-04-07 ENCOUNTER — Inpatient Hospital Stay: Payer: Medicare HMO | Attending: Obstetrics & Gynecology | Admitting: Obstetrics & Gynecology

## 2023-04-07 ENCOUNTER — Inpatient Hospital Stay: Payer: Medicare HMO

## 2023-04-07 ENCOUNTER — Encounter: Payer: Self-pay | Admitting: Obstetrics & Gynecology

## 2023-04-07 VITALS — BP 142/88 | HR 83 | Temp 98.5°F | Resp 18 | Wt 153.8 lb

## 2023-04-07 DIAGNOSIS — Z9221 Personal history of antineoplastic chemotherapy: Secondary | ICD-10-CM | POA: Insufficient documentation

## 2023-04-07 DIAGNOSIS — C569 Malignant neoplasm of unspecified ovary: Secondary | ICD-10-CM

## 2023-04-07 DIAGNOSIS — Z8543 Personal history of malignant neoplasm of ovary: Secondary | ICD-10-CM | POA: Insufficient documentation

## 2023-04-07 DIAGNOSIS — N898 Other specified noninflammatory disorders of vagina: Secondary | ICD-10-CM | POA: Insufficient documentation

## 2023-04-07 NOTE — Progress Notes (Signed)
Follow Up Note: Gyn-Onc  Leah Perkins 67 y.o. female  CC: She presents for a f/u visit   HPI: The oncology history was reviewed.  Interval History: She denies abdominal distention, pain, weight loss or change in her bowel habits.  Left nipple sore with light touch/clothing.  H/O clogged duct.  C/O vaginal dryness, entry dyspareunia.  Uses lubricants.  No moisturizers.    Review of Systems  Review of Systems  Constitutional:  Negative for malaise/fatigue and weight loss.  Respiratory:  Negative for shortness of breath and wheezing.   Cardiovascular:  Negative for chest pain and leg swelling.  Gastrointestinal:  Negative for abdominal pain, blood in stool, constipation, nausea and vomiting.  Genitourinary:  Negative for dysuria, frequency, hematuria and urgency.  Musculoskeletal:  Negative for joint pain and myalgias.  Neurological:  Negative for weakness.  Psychiatric/Behavioral:  Negative for depression. The patient does not have insomnia.    Current medications, allergy, social history, past surgical history, past medical history, family history were all reviewed.    Vitals:  BP (!) 142/88 Comment: MD notified, manual recheck  Pulse 83   Temp 98.5 F (36.9 C)   Resp 18   Wt 153 lb 12.8 oz (69.8 kg)   LMP  (LMP Unknown)   SpO2 100%   BMI 24.82 kg/m    Physical Exam:  Physical Exam Exam conducted with a chaperone present.  Constitutional:      General: She is not in acute distress. Cardiovascular:     Rate and Rhythm: Normal rate and regular rhythm.  Pulmonary:     Effort: Pulmonary effort is normal.     Breath sounds: Normal breath sounds. No wheezing or rhonchi.  Abdominal:     Palpations: Abdomen is soft.     Tenderness: There is no abdominal tenderness. There is no right CVA tenderness or left CVA tenderness.     Hernia: No hernia is present.  Genitourinary:    General: Normal vulva.     Urethra: No urethral lesion.     Vagina: 3 mm hymenal tag cyst--7  o'clock--mild tender; reproduces pain w/intercourse. No bleeding Musculoskeletal:     Cervical back: Neck supple.     Right lower leg: No edema.     Left lower leg: No edema.  Lymphadenopathy:     Upper Body:     Right upper body: No supraclavicular adenopathy.     Left upper body: No supraclavicular adenopathy.     Lower Body: No right inguinal adenopathy. No left inguinal adenopathy.  Skin:    Findings: No rash.  Neurological:     Mental Status: She is oriented to person, place, and time.    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  No evidence of recurrence  Small, hymenal tag cyst--symptomatic   >Return return for excision of the cyst     .I personally spent 25 minutes face-to-face and non-face-to-face in the care of this patient, which includes all pre, intra, and post visit time on the date of service.   Antionette Char, MD

## 2023-04-07 NOTE — Patient Instructions (Addendum)
Vulvar/Vaginal Moisturizers  Moisturizer Options: Vitamin E oil: pump or capsule form Vitamin E cream (Gene's vitamin E cream) Coconut oil: bottle or bead form Shea butter Blossom Organic Lubricant (organic and all natural; www.blossomorganics.com) PE suppository(coconut oil/vitamin E/palm oil) Desert Harvest Aloe Glide      Consider the ingredients of the product - the fewer the ingredients the better!  Directions for Use: Clean and dry your hands Gently dab the vulvar/vaginal area dry as needed Apply a "pea-sized" amount of the moisturizer onto your fingertip Using you other hand, open the labia   Apply the moisturizer to the vulvar/vaginal tissues Wear loose fitting underwear/clothing if possible following application  Use moisturize 2-3 times daily as desired.   Return for vaginal cyst removal

## 2023-04-07 NOTE — Assessment & Plan Note (Addendum)
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  No evidence of recurrence  Small, hymenal tag cyst--symptomatic   >Return return for excision of the cyst

## 2023-04-12 ENCOUNTER — Telehealth: Payer: Self-pay | Admitting: *Deleted

## 2023-04-12 NOTE — Telephone Encounter (Signed)
Patient called and left a message for the office in regards to her CA-125 labs. Attempted to reach patient to let her know the results are not in for her CA-125. Left voicemail stating that once we have the results the provider would call back.

## 2023-04-14 LAB — CA 125: Cancer Antigen (CA) 125: 10.7 U/mL (ref 0.0–38.1)

## 2023-04-21 ENCOUNTER — Encounter: Payer: Self-pay | Admitting: Obstetrics & Gynecology

## 2023-04-21 ENCOUNTER — Inpatient Hospital Stay: Payer: Medicare HMO | Attending: Obstetrics & Gynecology | Admitting: Obstetrics & Gynecology

## 2023-04-21 VITALS — BP 140/90 | HR 78 | Temp 97.6°F | Resp 18 | Wt 155.6 lb

## 2023-04-21 DIAGNOSIS — N907 Vulvar cyst: Secondary | ICD-10-CM

## 2023-04-21 DIAGNOSIS — Z8542 Personal history of malignant neoplasm of other parts of uterus: Secondary | ICD-10-CM | POA: Insufficient documentation

## 2023-04-21 DIAGNOSIS — C569 Malignant neoplasm of unspecified ovary: Secondary | ICD-10-CM

## 2023-04-21 DIAGNOSIS — N898 Other specified noninflammatory disorders of vagina: Secondary | ICD-10-CM | POA: Diagnosis present

## 2023-04-21 NOTE — Progress Notes (Signed)
Patient ID: Leah Perkins, female   DOB: 03/15/1956, 67 y.o.   MRN: [id number]  Chief Complaint  Patient presents with   Endometrial cancer [facility])    HPI Leah Perkins is a 67 y.o. female.  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   UTI (urinary tract infection)    Patient denies this dx    Past Surgical History:  Procedure Laterality Date   ABDOMINAL HYSTERECTOMY  09/2011   APPENDECTOMY  2009   w/ hysterectomy   Bowel obstruction repair     10/2021   COLONOSCOPY     DEBULKING  09/2011   DILATION AND CURETTAGE OF UTERUS  1996   HAMMER TOE SURGERY Right 2017   HERNIA REPAIR  02/08/20   INGUINAL HERNIA REPAIR Right 02/08/2020   Procedure: REPAIR RIGHT INGUINAL HERNIA WITH MESH;  Surgeon: Harriette Bouillon, MD;  Location: [facility] OR;  Service: General;  Laterality: Right;  GENERAL AND TAP BLOCK   KNEE ARTHROSCOPY Right 2009   Knee   LAPAROSCOPIC LYSIS OF ADHESIONS N/A 10/17/2021   Procedure: LAPAROSCOPIC LYSIS OF ADHESIONS;  Surgeon: Gaynelle Adu, MD;  Location: [facility];  Service: General;  Laterality: N/A;  30 minutes    LAPAROSCOPY N/A 10/17/2021   Procedure: LAPAROSCOPY DIAGNOSTIC;  Surgeon: Gaynelle Adu, MD;  Location: [facility];  Service: General;  Laterality: N/A;   TONSILLECTOMY  1991   TOTAL ABDOMINAL HYSTERECTOMY W/ BILATERAL SALPINGOOPHORECTOMY  09/2011   TAHBSO with appendectomy and optimal tumor debulking in  [place]   WISDOM TOOTH EXTRACTION  1985    Family History  Problem Relation Age of Onset   Hypertension Mother    Hypertrophic cardiomyopathy Mother 74       pt negativ rofr gene 08/30/2012   Early death Mother    Alzheimer's disease Father    Cancer Brother        Squamous cell of sinus, hx smok inhalation Public house manager)   Early death Brother    Heart attack Brother    Lymphoma Paternal Uncle        late 50's/early 14's   Alzheimer's disease Paternal Grandmother    Heart attack Paternal Grandfather 25   Miscarriages / Stillbirths Daughter    Hypertrophic cardiomyopathy Son    Early death Son    Early death Son    Colon cancer Neg Hx    Breast cancer Neg Hx    Ovarian cancer Neg Hx    Endometrial cancer Neg Hx    Pancreatic cancer Neg Hx    Prostate cancer Neg Hx     Social History Social History   Tobacco Use   Smoking status: Never   Smokeless tobacco: Never  Vaping Use   Vaping status: Never Used  Substance Use Topics   Alcohol use: Not Currently    Comment: Socially ~1 / week   Drug use: No    Allergies  Allergen  Reactions   Lisinopril Other (See Comments)    Decreased renal fx while on- resolved when dc'd   Sulfa Antibiotics Other (See Comments)    Unknown childhood reaction    Current Outpatient Medications  Medication Sig Dispense Refill   amLODipine (NORVASC) 2.5 MG tablet Take 1 tablet (2.5 mg total) by mouth daily. 90 tablet 1   Calcium Carb-Cholecalciferol (CALCIUM 600 + D PO) Take 1 tablet by mouth in the morning and at bedtime.      Multiple Vitamins-Minerals (MULTIVITAMIN WITH MINERALS) tablet Take 1 tablet by mouth at bedtime.     vitamin C (ASCORBIC ACID) 500 MG tablet Take 500 mg by mouth 2 (two) times daily.     No current facility-administered medications for this visit.    Review of Systems Review of Systems  Blood pressure (!) 140/90, pulse 78, temperature 97.6 F (36.4 C), temperature source Oral, resp. rate 18, weight 155 lb 9.6 oz  (70.6 kg), SpO2 100%.  Physical Exam Exam unchanged from the last visit   Procedure Note   A timeout was performed confirming the pt's allergy status, procedur Prepping with Betadine Local anesthesia with 1% Lidocaine The nodules were circumscribed with a scalpel and excised.  The ellipse was closed with horizontal mattress sutures of 4 - 0 Vicry.  Adequate hemostasis was noted.  The wound was irrigated.    Well tolerated  Specimens appropriately identified and sent to pathology    Assessment/Plan: Hymenal tag nodules excised  >review the histology from the biopsies >wound care instructions provided >return prn or in a few weeks      Antionette Char, MD 04/21/2023, 10:09 PM

## 2023-04-21 NOTE — Patient Instructions (Signed)
Return in 2 weeks  What is a vulvar/vaginal biopsy?  A vulvar/vaginal biopsy takes one or more samples of tissue from the vulva or vagina. The vulva is the outer parts of the female genitals, including the labia, which are often called the lips, and the clitoris. The vagina is the opening that leads to the cervix, which is the entrance to the uterus.  Why is a vulvar/vaginal biopsy needed?  A biopsy is done to determine why you may have noticed itching, redness, swelling or changes in color to your vulva or vagina. It may also be done if you have a lump or sore that has recently appeared.  What happens during the procedure?  The biopsy area will be cleaned with an antiseptic liquid. If you are having a vaginal biopsy, your provider will use a speculum to open your vagina. A speculum is the same instrument used during a Pap smear. Numbing medicine will be injected into the area that is going to be biopsied. One or more small pieces of tissue will be removed and sent to a lab for analysis. If you require stitches, dissolvable stitches will most likely be used, which do not need to be removed by your provider. You may feel some discomfort and pressure during the procedure.   What are the risks?  A small risk of:  Bleeding Infection What should I do to prepare for the procedure?  You may be asked to trim your pubic hair in the area where the biopsy will be taken. Or this may be done by your provider before he or she takes the biopsy.  What should I expect during recovery?  Keep the area as clean and dry as possible You may bathe or shower as soon as you want after the procedure. Gently pat the area dry with a clean towel If urine stings the area when you go to the bathroom, you can coat it with a thin film of Vaseline  Call your provider if you experience:  Redness, swelling and skin that is warm to the touch in the biopsy area Fever (100.4oF or greater) in the 10 days following the  procedure Foul-smelling vaginal discharge Heavy vaginal bleeding    If your pain becomes severe, or your fever rises above 102oF in the 3 days following the procedure you should go to the emergency room.  How long will it take to get my results?  It should take 1-2 weeks to get your results

## 2023-04-23 LAB — SURGICAL PATHOLOGY

## 2023-04-26 ENCOUNTER — Telehealth: Payer: Self-pay | Admitting: *Deleted

## 2023-04-26 NOTE — Telephone Encounter (Signed)
Spoke with Leah Perkins who states she is doing well and has no further concerns. She was advised by Dr. Alvester Morin on Saturday morning when she called the after hours line and has been following her advice. Pt denies, fever, chills, vaginal discharge or bleeding and denies all urinary symptoms. Advised to call the office with any questions, concerns or worsening symptoms. Pt verbalized understanding.

## 2023-04-27 ENCOUNTER — Telehealth: Payer: Self-pay

## 2023-04-27 NOTE — Telephone Encounter (Signed)
-----   Message from Doylene Bode sent at 04/27/2023  1:03 PM EDT ----- Please let the patient know:   Your biopsy returned with a benign polyp of tissue that is negative for pre-cancer or cancer. Good news

## 2023-04-27 NOTE — Telephone Encounter (Signed)
LVM for patient to call office at her earliest  convenience regarding the recent biopsy result.

## 2023-05-26 ENCOUNTER — Inpatient Hospital Stay: Payer: Medicare HMO | Attending: Obstetrics & Gynecology | Admitting: Obstetrics & Gynecology

## 2023-05-26 ENCOUNTER — Encounter: Payer: Self-pay | Admitting: Obstetrics & Gynecology

## 2023-05-26 ENCOUNTER — Ambulatory Visit: Payer: Medicare HMO | Admitting: Obstetrics & Gynecology

## 2023-05-26 VITALS — BP 142/80 | HR 84 | Temp 97.7°F | Resp 19 | Wt 154.2 lb

## 2023-05-26 DIAGNOSIS — Z90722 Acquired absence of ovaries, bilateral: Secondary | ICD-10-CM | POA: Insufficient documentation

## 2023-05-26 DIAGNOSIS — Z8543 Personal history of malignant neoplasm of ovary: Secondary | ICD-10-CM | POA: Diagnosis present

## 2023-05-26 DIAGNOSIS — Z90710 Acquired absence of both cervix and uterus: Secondary | ICD-10-CM | POA: Insufficient documentation

## 2023-05-26 DIAGNOSIS — C569 Malignant neoplasm of unspecified ovary: Secondary | ICD-10-CM

## 2023-05-26 NOTE — Progress Notes (Signed)
Patient ID: Leah Perkins, female   DOB: [birth year], 67 y.o.   MRN: [id number]  Chief Complaint  Patient presents with   Malignant neoplasm of ovary, unspecified laterality (HCC)    HP Leah Perkins is a 67 y.o. female.  She presents s/p excisional bx(ies) of tender hymenal tag nodules.  She reports that a suture is present and she has a "tugging" sensation. HPI  Review of prior data: 10/24: HYMENAL RING NODULES, EXCISION:  - Benign fibroepithelial polyp(s)  - Negative f...   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   UTI (urinary tract infection)    Patient denies this dx    Past Surgical History:  Procedure Laterality Date   ABDOMINAL HYSTERECTOMY  09/2011   APPENDECTOMY  2009   w/ hysterectomy   Bowel obstruction repair     10/2021   COLONOSCOPY     DEBULKING  09/2011   DILATION AND CURETTAGE OF UTERUS  1996   HAMMER TOE SURGERY Right 2017   HERNIA REPAIR  02/08/20   INGUINAL HERNIA REPAIR Right 02/08/2020   Procedure: REPAIR RIGHT INGUINAL HERNIA WITH MESH;  Surgeon: Harriette Bouillon, MD;  Location: [facility];  Service: General;  Laterality: Right;  GENERAL AND TAP BLOCK   KNEE ARTHROSCOPY Right 2009   Knee   LAPAROSCOPIC LYSIS OF ADHESIONS N/A 10/17/2021   Procedure: LAPAROSCOPIC LYSIS OF ADHESIONS;  Surgeon: Gaynelle Adu, MD;  Location: [facility];  Service: General;  Laterality: N/A;  30 minutes    LAPAROSCOPY N/A 10/17/2021   Procedure: LAPAROSCOPY DIAGNOSTIC;  Surgeon:  Gaynelle Adu, MD;  Location: [facility];  Service: General;  Laterality: N/A;   TONSILLECTOMY  1991   TOTAL ABDOMINAL HYSTERECTOMY W/ BILATERAL SALPINGOOPHORECTOMY  09/2011   TAHBSO with appendectomy and optimal tumor debulking in [place]   WISDOM TOOTH EXTRACTION  1985    Family History  Problem Relation Age of Onset   Hypertension Mother    Hypertrophic cardiomyopathy Mother 8       pt negativ rofr gene Aug 28, 2012   Early death Mother    Alzheimer's disease Father    Cancer Brother        Squamous cell of sinus, hx smok inhalation Public house manager)   Early death Brother    Heart attack Brother    Lymphoma Paternal Uncle        late 50's/early 57's   Alzheimer's disease Paternal Grandmother    Heart attack Paternal Grandfather 66   Miscarriages / Stillbirths Daughter    Hypertrophic cardiomyopathy Son    Early death Son    Early death Son    Colon cancer Neg Hx    Breast cancer Neg Hx    Ovarian cancer Neg Hx    Endometrial cancer Neg Hx    Pancreatic cancer Neg Hx    Prostate cancer Neg Hx     Social History Social History   Tobacco Use   Smoking status: Never   Smokeless tobacco:  Never  Vaping Use   Vaping status: Never Used  Substance Use Topics   Alcohol use: Not Currently    Comment: Socially ~1 / week   Drug use: No    Allergies  Allergen Reactions   Lisinopril Other (See Comments)    Decreased renal fx while on- resolved when dc'd   Sulfa Antibiotics Other (See Comments)    Unknown childhood reaction    Current Outpatient Medications  Medication Sig Dispense Refill   amLODipine (NORVASC) 2.5 MG tablet Take 1 tablet (2.5 mg total) by mouth daily. 90 tablet 1   Calcium Carb-Cholecalciferol (CALCIUM 600 + D PO) Take 1 tablet by mouth in the morning and at bedtime.      Multiple Vitamins-Minerals (MULTIVITAMIN WITH MINERALS) tablet Take 1 tablet by mouth at bedtime.     vitamin C (ASCORBIC ACID) 500 MG tablet Take 500 mg by mouth 2 (two) times daily.     No current  facility-administered medications for this visit.    Review of Systems Review of Systems GU: see above   Physical Exam BP (!) 142/80 Comment: manual recehck, MD notified  Pulse 84   Temp 97.7 F (36.5 C) (Oral)   Resp 19   Wt 154 lb 3.2 oz (69.9 kg)   LMP  (LMP Unknown)   SpO2 100%   BMI 24.89 kg/m         Assessment/Plan: Hymenal tag nodules excised-site well healed   >return prn     I personally spent 25 minutes face-to-face and non-face-to-face in the care of this patient, which includes all pre, intra, and post visit time on the date of service.    Antionette Char, MD 05/26/2023, 1:35 PM

## 2023-05-26 NOTE — Patient Instructions (Signed)
Return as needed

## 2023-08-06 ENCOUNTER — Other Ambulatory Visit: Payer: Self-pay | Admitting: Family Medicine

## 2023-09-20 ENCOUNTER — Ambulatory Visit (INDEPENDENT_AMBULATORY_CARE_PROVIDER_SITE_OTHER): Payer: Medicare HMO | Admitting: Family Medicine

## 2023-09-20 ENCOUNTER — Encounter: Payer: Self-pay | Admitting: Family Medicine

## 2023-09-20 VITALS — BP 136/84 | HR 92 | Temp 98.3°F | Wt 156.0 lb

## 2023-09-20 DIAGNOSIS — E782 Mixed hyperlipidemia: Secondary | ICD-10-CM

## 2023-09-20 DIAGNOSIS — I10 Essential (primary) hypertension: Secondary | ICD-10-CM

## 2023-09-20 MED ORDER — AMLODIPINE BESYLATE 2.5 MG PO TABS: 2.5000 mg | ORAL_TABLET | Freq: Every day | ORAL | 3 refills | Status: DC

## 2023-09-20 NOTE — Progress Notes (Signed)
 Patient ID: Leah Perkins, female  DOB: [birth year], 68 y.o.   MRN: [id number] Patient Care Team    Relationship Specialty Notifications Start End  Natalia Leatherwood, DO PCP - General Family Medicine  02/13/19   Cleda Mccreedy, MD  Gynecologic Oncology  12/26/11   Laurette Schimke, MD Attending Physician Obstetrics and Gynecology  02/13/19   Judi Saa, DO Consulting Physician Sports Medicine  02/13/19   Harriette Bouillon, MD Consulting Physician General Surgery  10/13/21   Antionette Char, MD Consulting Physician Obstetrics and Gynecology  10/13/21     Chief Complaint  Patient presents with   Hypertension    Subjective Leah Perkins is a 68 y.o.  Female  present for Chronic Conditions/illness Management All past medical history, surgical history, allergies, family history, immunizations, medications and social history were updated in the electronic medical record today. All recent labs, ED visits and hospitalizations within the last year were reviewed.  Hypertension/HLD:  Pt reports compliance with amlodipine 2.5 mg daily. Patient denies chest pain, shortness of breath, dizziness or lower extremity edema.  Home BP is 120s systolic.  Patient did experience fluctuations in blood pressure 4 weeks ago.  Atenolol was discontinued and amlodipine was started. Diet: low sodium Exercise: routine RF: htn, hld, overweight, fhx.      04/05/2023    7:49 AM 09/17/2022    9:37 AM 09/09/2022    9:39 AM 08/20/2021   11:42 AM 03/11/2021    9:00 AM  Depression screen PHQ 2/9  Decreased Interest 0 0 0 0 0  Down, Depressed, Hopeless 0 0 0 0 0  PHQ - 2 Score 0 0 0 0 0       No data to display           Immunization History  Administered Date(s) Administered   Fluad Quad(high Dose 65+) 08/14/2020, 03/11/2021   Fluad Trivalent(High Dose 65+) 04/05/2023   Influenza Split 04/26/2017   Influenza,inj,Quad PF,6+ Mos 05/02/2019   Influenza-Unspecified 04/27/2018   PFIZER(Purple Top)SARS-COV-2  Vaccination 07/24/2019, 08/12/2019, 03/05/2020   PNEUMOCOCCAL CONJUGATE-20 09/09/2021   Pneumococcal Conjugate-13 08/14/2020   Tdap 07/13/2013   Zoster Recombinant(Shingrix) 02/13/2019, 05/16/2019     Past Medical History:  Diagnosis Date   Allergy    Basal cell carcinoma of face 09/09/2021   Chicken pox    Genetic testing 12/16/2017   Hypertension    Inflamed seborrheic keratosis 09/09/2021   Ovarian cancer (HCC) 09/2011   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 9/13,  Followed by Dr Gehrig/Dr Jean Rosenthal- Christell Constant   Patellar contusion, right, initial encounter 03/21/2018   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   UTI (urinary tract infection)    Patient denies this dx   Allergies  Allergen Reactions   Lisinopril Other (See Comments)    Decreased renal fx while on- resolved when dc'd   Sulfa Antibiotics Other (See Comments)    Unknown childhood reaction   Past Surgical History:  Procedure Laterality Date   ABDOMINAL HYSTERECTOMY  09/2011   APPENDECTOMY  2009   w/ hysterectomy   Bowel obstruction repair     10/2021   COLONOSCOPY     DEBULKING  09/2011   DILATION AND CURETTAGE OF UTERUS  1996   HAMMER TOE SURGERY Right 2017   HERNIA REPAIR  02/08/20   INGUINAL HERNIA REPAIR Right 02/08/2020   Procedure: REPAIR RIGHT INGUINAL HERNIA WITH MESH;  Surgeon: Harriette Bouillon, MD;  Location: [facility];  Service: General;  Laterality: Right;  GENERAL AND TAP BLOCK   KNEE ARTHROSCOPY Right 2009   Knee   LAPAROSCOPIC LYSIS OF ADHESIONS N/A 10/17/2021   Procedure: LAPAROSCOPIC LYSIS OF ADHESIONS;  Surgeon: Gaynelle Adu, MD;  Location[facility];  Service: General;  Laterality: N/A;  30 minutes    LAPAROSCOPY N/A 10/17/2021   Procedure: LAPAROSCOPY DIAGNOSTIC;  Surgeon: Gaynelle Adu, MD;  Location[facility];  Service: General;  Laterality: N/A;   TONSILLECTOMY  1991   TOTAL ABDOMINAL HYSTERECTOMY W/ BILATERAL SALPINGOOPHORECTOMY  09/2011   TAHBSO with appendectomy and  optimal tumor debulking in [place]   WISDOM TOOTH EXTRACTION  1985   Family History  Problem Relation Age of Onset   Hypertension Mother    Hypertrophic cardiomyopathy Mother 14       pt negativ rofr gene 2012/08/17   Early death Mother    Alzheimer's disease Father    Cancer Brother        Squamous cell of sinus, hx smok inhalation Public house manager)   Early death Brother    Heart attack Brother    Lymphoma Paternal Uncle        late 50's/early 104's   Alzheimer's disease Paternal Grandmother    Heart attack Paternal Grandfather 74   Miscarriages / Stillbirths Daughter    Hypertrophic cardiomyopathy Son    Early death Son    Early death Son    Colon cancer Neg Hx    Breast cancer Neg Hx    Ovarian cancer Neg Hx    Endometrial cancer Neg Hx    Pancreatic cancer Neg Hx    Prostate cancer Neg Hx    Social History   Social History Narrative   Marital status/children/pets: Married, 3 children. Has grandchildren   Education/employment: [employer] retired   Field seismologist:      -Wears a bicycle helmet riding a bike: Yes     -smoke alarm in the home:Yes     - wears seatbelt: Yes     - Feels safe in their relationships: Yes    Allergies as of 09/20/2023       Reactions   Lisinopril Other (See Comments)   Decreased renal fx while on- resolved when dc'd   Sulfa Antibiotics Other (See Comments)   Unknown childhood reaction        Medication List        Accurate as of September 20, 2023  9:56 AM. If you have any questions, ask your nurse or doctor.          amLODipine 2.5 MG tablet Commonly known as: NORVASC Take 1 tablet (2.5 mg total) by mouth daily.   ascorbic acid 500 MG tablet Commonly known as: VITAMIN C Take 500 mg by mouth 2 (two) times daily.   atenolol 25 MG tablet Commonly known as: TENORMIN Take by mouth.   CALCIUM 600 + D PO Take 1 tablet by mouth in the morning and at bedtime.   multivitamin with minerals tablet Take 1 tablet by mouth at bedtime.        All  past medical history, surgical history, allergies, family history, immunizations andmedications were updated in the EMR today and reviewed under the history and medication portions of their EMR.     No results found for this or any previous visit (from the past 2160 hours).   ROS 14 pt review of systems performed and negative (unless mentioned in an HPI)  Objective: BP 136/84   Pulse 92   Temp 98.3 F (36.8 C)  Wt 156 lb (70.8 kg)   LMP  (LMP Unknown)   SpO2 98%   BMI 25.18 kg/m  Physical Exam Vitals and nursing note reviewed.  Constitutional:      General: She is not in acute distress.    Appearance: Normal appearance. She is not ill-appearing, toxic-appearing or diaphoretic.  HENT:     Head: Normocephalic and atraumatic.  Eyes:     General: No scleral icterus.       Right eye: No discharge.        Left eye: No discharge.     Extraocular Movements: Extraocular movements intact.     Conjunctiva/sclera: Conjunctivae normal.     Pupils: Pupils are equal, round, and reactive to light.  Cardiovascular:     Rate and Rhythm: Normal rate and regular rhythm.  Pulmonary:     Effort: Pulmonary effort is normal. No respiratory distress.     Breath sounds: Normal breath sounds. No wheezing, rhonchi or rales.  Musculoskeletal:     Right lower leg: No edema.     Left lower leg: No edema.  Skin:    General: Skin is warm.     Findings: No rash.  Neurological:     Mental Status: She is alert and oriented to person, place, and time. Mental status is at baseline.     Motor: No weakness.     Gait: Gait normal.  Psychiatric:        Mood and Affect: Mood normal.        Behavior: Behavior normal.        Thought Content: Thought content normal.        Judgment: Judgment normal.     No results found.  Assessment/plan: PAIGHTON GODETTE is a 68 y.o. female present for Chronic Conditions/illness Management  Essential hypertension/HLD/family history of cardiomyopathy Stable Continue amlodipine 2.5 mg daily Labs up-to-date-due next visit   Return in about 7 months (around 04/06/2024).   No orders of the defined types were placed in this encounter.  Meds ordered this encounter  Medications   amLODipine (NORVASC) 2.5 MG tablet    Sig: Take 1 tablet (2.5 mg total) by mouth daily.    Dispense:  90 tablet    Refill:  3   Referral Orders  No referral(s) requested today     Electronically signed by: Felix Pacini, DO [facility] [place]

## 2023-09-20 NOTE — Patient Instructions (Addendum)
 Return in about 7 months (around 04/06/2024).      Great to see you today.  I have refilled the medication(s) we provide.   If labs were collected or images ordered, we will inform you of  results once we have received them and reviewed. We will contact you either by echart message, or telephone call.  Please give ample time to the testing facility, and our office to run,  receive and review results. Please do not call inquiring of results, even if you can see them in your chart. We will contact you as soon as we are able. If it has been over 1 week since the test was completed, and you have not yet heard from [place], then please call us.    - echart message- for normal results that have been seen by the patient already.   - telephone call: abnormal results or if patient has not viewed results in their echart.  If a referral to a specialist was entered for you, please call us in 2 weeks if you have not heard from the specialist office to schedule. '

## 2023-11-12 ENCOUNTER — Ambulatory Visit
Admission: RE | Admit: 2023-11-12 | Discharge: 2023-11-12 | Disposition: A | Discharge status: Home / Self Care | Payer: Medicare HMO | Source: Ambulatory Visit | Attending: Family Medicine | Admitting: Family Medicine

## 2023-11-12 DIAGNOSIS — Z1231 Encounter for screening mammogram for malignant neoplasm of breast: Secondary | ICD-10-CM

## 2023-11-12 DIAGNOSIS — E2839 Other primary ovarian failure: Secondary | ICD-10-CM

## 2023-11-12 HISTORY — DX: Encounter for nonprocreative screening for genetic disease carrier status: Z13.71

## 2023-11-15 ENCOUNTER — Encounter: Payer: Self-pay | Admitting: Family Medicine

## 2023-11-16 ENCOUNTER — Encounter: Payer: Self-pay | Admitting: Family Medicine

## 2023-11-17 ENCOUNTER — Other Ambulatory Visit: Payer: Self-pay | Admitting: Family Medicine

## 2023-11-17 DIAGNOSIS — R928 Other abnormal and inconclusive findings on diagnostic imaging of breast: Secondary | ICD-10-CM

## 2023-12-09 ENCOUNTER — Ambulatory Visit

## 2023-12-09 ENCOUNTER — Ambulatory Visit
Admission: RE | Admit: 2023-12-09 | Discharge: 2023-12-09 | Disposition: A | Discharge status: Home / Self Care | Source: Ambulatory Visit | Attending: Family Medicine | Admitting: Family Medicine

## 2023-12-09 DIAGNOSIS — R928 Other abnormal and inconclusive findings on diagnostic imaging of breast: Secondary | ICD-10-CM

## 2024-01-03 NOTE — Progress Notes (Signed)
 [facility] [address] [place] [address] Phone: [phone] Subjective:   LILLETTE Berwyn Posey, am serving as Perkins scribe for Dr. Arthea Claudene.  I'm seeing this patient by the request  of:  Kuneff, Renee A, DO  CC: left wrist pain   YEP:Dlagzrupcz  [name] is Perkins 68 y.o. female coming in with complaint of L wrist pain. Patient states that 2 months ago she noticed 2 small cysts.  Fairly severe overall.    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   UTI (urinary tract infection)    Patient denies this dx   Past Surgical History:  Procedure Laterality Date   ABDOMINAL HYSTERECTOMY  09/2011   APPENDECTOMY  2009   w/ hysterectomy   Bowel obstruction repair     10/2021   COLONOSCOPY     DEBULKING  09/2011   DILATION AND CURETTAGE OF UTERUS  1996   HAMMER TOE SURGERY Right 2017   HERNIA REPAIR  02/08/20   INGUINAL HERNIA REPAIR Right 02/08/2020   Procedure: REPAIR RIGHT INGUINAL HERNIA WITH MESH;  Surgeon: Vanderbilt Ned, MD;  Location: [facility];  Service: General;  Laterality: Right;  GENERAL AND TAP BLOCK   KNEE ARTHROSCOPY Right 2009   Knee   LAPAROSCOPIC LYSIS OF ADHESIONS N/Perkins 10/17/2021   Procedure: LAPAROSCOPIC LYSIS OF ADHESIONS;  Surgeon: Tanda Locus, MD;  Location: [facility];  Service: General;  Laterality: N/Perkins;  30 minutes    LAPAROSCOPY N/Perkins 10/17/2021   Procedure: LAPAROSCOPY DIAGNOSTIC;  Surgeon: Tanda Locus, MD;  Location: [facility];  Service: General;  Laterality: N/Perkins;   TONSILLECTOMY  1991   TOTAL ABDOMINAL HYSTERECTOMY W/  BILATERAL SALPINGOOPHORECTOMY  09/2011   TAHBSO with appendectomy and optimal tumor debulking in [place]   WISDOM TOOTH EXTRACTION  1985   Social History   Socioeconomic History   Marital status: Married    Spouse name: Not on file   Number of children: Not on file   Years of education: Not on file   Highest education level: Associate degree: academic program  Occupational History   Not on file  Tobacco Use   Smoking status: Never   Smokeless tobacco: Never  Vaping Use   Vaping status: Never Used  Substance and Sexual Activity   Alcohol use: Not Currently    Comment: Socially ~1 / week   Drug use: No   Sexual activity: Yes    Partners: Male    Birth control/protection: Surgical    Comment: Hysterectomy  Other Topics Concern   Not on file  Social History Narrative   Marital status/children/pets: Married, 3 children. 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   Field seismologist:      -Wears Perkins bicycle helmet riding Perkins bike: Yes     -smoke alarm in the home:Yes     - wears seatbelt: Yes     - Feels safe in their relationships: Yes   Social Drivers of Health   Financial Resource Strain: Low Risk  (09/16/2023)   Overall Financial  Resource Strain (CARDIA)    Difficulty of Paying Living Expenses: Not hard at all  Food Insecurity: No Food Insecurity (09/16/2023)   Hunger Vital Sign    Worried About Running Out of Food in the Last Year: Never true    Ran Out of Food in the Last Year: Never true  Transportation Needs: No Transportation Needs (09/16/2023)   PRAPARE - Administrator, Civil Service (Medical): No    Lack of Transportation (Non-Medical): No  Physical Activity: Sufficiently Active (09/16/2023)   Exercise Vital Sign    Days of Exercise per Week: 6 days    Minutes of Exercise per Session: 70 min  Stress: No Stress Concern Present (09/16/2023)   [employer] of Occupational Health - Occupational Stress Questionnaire    Feeling of Stress : Only Perkins little   Social Connections: Moderately Integrated (09/16/2023)   Social Connection and Isolation Panel    Frequency of Communication with Friends and Family: More than three times Perkins week    Frequency of Social Gatherings with Friends and Family: Twice Perkins week    Attends Religious Services: More than 4 times per year    Active Member of [employer] or Organizations: No    Attends Engineer, structural: Not on file    Marital Status: Married   Allergies  Allergen Reactions   Lisinopril Other (See Comments)    Decreased renal fx while on- resolved when dc'Leah   Sulfa Antibiotics Other (See Comments)    Unknown childhood reaction   Family History  Problem Relation Age of Onset   Hypertension Mother    Hypertrophic cardiomyopathy Mother 34       pt negativ rofr gene 23-Aug-2012   Early death Mother    Alzheimer's disease Father    Miscarriages / [place] Daughter    Lymphoma Paternal Uncle        late 50's/early 60's   Alzheimer's disease Paternal Grandmother    Heart attack Paternal Grandfather 21   Cancer Brother        Squamous cell of sinus, hx smok inhalation Public house manager)   Early death Brother    Heart attack Brother    Hypertrophic cardiomyopathy Son    Early death Son    Early death Son    Colon cancer Neg Hx    Breast cancer Neg Hx    Ovarian cancer Neg Hx    Endometrial cancer Neg Hx    Pancreatic cancer Neg Hx    Prostate cancer Neg Hx    BRCA 1/2 Neg Hx      Current Outpatient Medications (Cardiovascular):    amLODipine  (NORVASC ) 2.5 MG tablet, Take 1 tablet (2.5 mg total) by mouth daily.   atenolol  (TENORMIN ) 25 MG tablet, Take by mouth.     Current Outpatient Medications (Other):    Calcium Carb-Cholecalciferol (CALCIUM 600 + Leah PO), Take 1 tablet by mouth in the morning and at bedtime.    Multiple Vitamins-Minerals (MULTIVITAMIN WITH MINERALS) tablet, Take 1 tablet by mouth at bedtime.   vitamin C (ASCORBIC ACID) 500 MG tablet, Take 500 mg by mouth 2 (two) times  daily.   Reviewed prior external information including notes and imaging from  primary care provider As well as notes that were available from care everywhere and other healthcare systems.  Past medical history, social, surgical and family history all reviewed in electronic medical record.  No pertanent information unless stated regarding to the chief complaint.   Review of Systems:  No  headache, visual changes, nausea, vomiting, diarrhea, constipation, dizziness, abdominal pain, skin rash, fevers, chills, night sweats, weight loss, swollen lymph nodes, body aches, joint swelling, chest pain, shortness of breath, mood changes. POSITIVE muscle aches  Objective  There were no vitals taken for this visit.   General: No apparent distress alert and oriented x3 mood and affect normal, dressed appropriately.  HEENT: Pupils equal, extraocular movements intact  Respiratory: Patient's speak in full sentences and does not appear short of breath  Cardiovascular: No lower extremity edema, non tender, no erythema  Left wrist exam patient does have what appears to be Perkins cystic formation noted on the ventral aspect of the wrist close to the radial side.  Limited muscular skeletal ultrasound was performed and interpreted by CLAUDENE HUSSAR, M  Limited ultrasound shows Perkins hypoechoic mass that seems to encompass the tendon sheath.  Does have some bulging that does go more superficial.  Procedure: Real-time Ultrasound Guided Injection of left wrist ganglion cyst Device: GE Logiq Q7 Ultrasound guided injection is preferred based studies that show increased duration, increased effect, greater accuracy, decreased procedural pain, increased response rate, and decreased cost with ultrasound guided versus blind injection.  Verbal informed consent obtained.  Time-out conducted.  Noted no overlying erythema, induration, or other signs of local infection.  Skin prepped in Perkins sterile fashion.  Local anesthesia: Topical  Ethyl chloride.  With sterile technique and under real time ultrasound guidance: With an 18-gauge 1 inch needle patient was injected with 0.5 cc of 0.5% Marcaine  and then have aspiration of the 8 cc of jellylike fluid then injected 0.5 cc of Kenalog 40 mg/mL Completed without difficulty  Pain immediately resolved suggesting accurate placement of the medication.  Advised to call if fevers/chills, erythema, induration, drainage, or persistent bleeding.  Impression: Technically successful ultrasound guided injection.    Impression and Recommendations:    The above documentation has been reviewed and is accurate and complete Sharmel Ballantine M Christabella Alvira, DO

## 2024-01-10 ENCOUNTER — Encounter: Payer: Self-pay | Admitting: Family Medicine

## 2024-01-10 ENCOUNTER — Ambulatory Visit (INDEPENDENT_AMBULATORY_CARE_PROVIDER_SITE_OTHER): Admitting: Family Medicine

## 2024-01-10 ENCOUNTER — Other Ambulatory Visit: Payer: Self-pay

## 2024-01-10 VITALS — BP 122/88 | HR 85 | Ht 66.0 in | Wt 160.0 lb

## 2024-01-10 DIAGNOSIS — M25532 Pain in left wrist: Secondary | ICD-10-CM

## 2024-01-10 DIAGNOSIS — M67432 Ganglion, left wrist: Secondary | ICD-10-CM | POA: Diagnosis not present

## 2024-01-10 NOTE — Assessment & Plan Note (Signed)
 Patient given injection and tolerated the procedure well.  Discussed icing regimen and home exercises, discussed which activities to do and which would not avoid.  Increase activity slowly.  Can aspirate again if reoccurrence occurs.

## 2024-01-10 NOTE — Patient Instructions (Signed)
 Drained ganglion cyst today Good to see you! See you again when you need me. Can make appointment for 3 months

## 2024-04-11 ENCOUNTER — Ambulatory Visit: Admitting: Family Medicine

## 2024-04-18 ENCOUNTER — Ambulatory Visit (INDEPENDENT_AMBULATORY_CARE_PROVIDER_SITE_OTHER): Admitting: Family Medicine

## 2024-04-18 VITALS — BP 135/78 | HR 78 | Temp 98.1°F | Wt 156.6 lb

## 2024-04-18 DIAGNOSIS — E782 Mixed hyperlipidemia: Secondary | ICD-10-CM | POA: Diagnosis not present

## 2024-04-18 DIAGNOSIS — Z23 Encounter for immunization: Secondary | ICD-10-CM | POA: Diagnosis not present

## 2024-04-18 DIAGNOSIS — R7309 Other abnormal glucose: Secondary | ICD-10-CM | POA: Diagnosis not present

## 2024-04-18 DIAGNOSIS — H3530 Unspecified macular degeneration: Secondary | ICD-10-CM | POA: Insufficient documentation

## 2024-04-18 DIAGNOSIS — I10 Essential (primary) hypertension: Secondary | ICD-10-CM

## 2024-04-18 MED ORDER — AMLODIPINE BESYLATE 5 MG PO TABS: 5.0000 mg | ORAL_TABLET | Freq: Every day | ORAL | 1 refills | Status: AC

## 2024-04-18 NOTE — Patient Instructions (Addendum)

## 2024-04-18 NOTE — Progress Notes (Signed)
 Patient ID: Leah Perkins, female  DOB: [birth year], 68 y.o.   MRN: [id number] Patient Care Team    Relationship Specialty Notifications Start End  Catherine Charlies LABOR, DO PCP - General Family Medicine  02/13/19   Dodie Shadow, MD  Gynecologic Oncology  12/26/11   Jerrol Harvey, MD Attending Physician Obstetrics and Gynecology  02/13/19   Claudene Arthea HERO, DO Consulting Physician Sports Medicine  02/13/19   Vanderbilt Ned, MD Consulting Physician General Surgery  10/13/21   Rogelio Planas, MD Consulting Physician Obstetrics and Gynecology  10/13/21   Waylan Cain, MD Consulting Physician Ophthalmology  04/18/24     Chief Complaint  Patient presents with   Hypertension     7 month follow up and Influenza vaccine    Subjective Leah Perkins is a 68 y.o.  Female  present for Chronic Conditions/illness Management All past medical history, surgical history, allergies, family history, immunizations, medications and social history were updated in the electronic medical record today. All recent labs, ED visits and hospitalizations within the last year were reviewed.  Hypertension/HLD:  Pt reports compliance with amlodipine  2.5 mg daily. Patient denies chest pain, shortness of breath, dizziness or lower extremity edema.   Diet: low sodium Exercise: routine RF: htn, hld, overweight, fhx.   Patient's husband presents with her today and has concerns over her  memory.  He has noticed within the last year she is becoming more forgetful.  Forgetting conversations, asking the same question recently answered except her.  She reports she does agree she has noticed she is becoming more forgetful, but sometimes she does admit she is just not listening all that well.  She has a family history of Alzheimer disease in her paternal grandmother, father and brother all which started having symptoms in their 34s.     04/18/2024    2:45 PM 04/18/2024    2:32 PM 04/05/2023    7:49 AM 09/17/2022    9:37 AM  09/09/2022    9:39 AM  Depression screen PHQ 2/9  Decreased Interest 0 0 0 0 0  Down, Depressed, Hopeless 0 0 0 0 0  PHQ - 2 Score 0 0 0 0 0  Altered sleeping 1      Tired, decreased energy 1      Change in appetite 0      Feeling bad or failure about yourself  0      Trouble concentrating 0      Moving slowly or fidgety/restless 0      Suicidal thoughts 0      PHQ-9 Score 2      Difficult doing work/chores Not difficult at all          04/18/2024    2:45 PM  GAD 7 : Generalized Anxiety Score  Nervous, Anxious, on Edge 1  Control/stop worrying 0  Worry too much - different things 1  Trouble relaxing 0  Restless 0  Easily annoyed or irritable 0  Afraid - awful might happen 0  Total GAD 7 Score 2  Anxiety Difficulty Not difficult at all     Immunization History  Administered Date(s) Administered   Fluad Quad(high Dose 65+) 08/14/2020, 03/11/2021   Fluad Trivalent(High Dose 65+) 04/05/2023   Fluzone Influenza virus vaccine,trivalent (IIV3), split virus 04/26/2017   INFLUENZA, HIGH DOSE SEASONAL PF 04/18/2024   Influenza,inj,Quad PF,6+ Mos 05/02/2019   Influenza-Unspecified 04/27/2018, 08/14/2020   PFIZER(Purple Top)SARS-COV-2 Vaccination 07/24/2019, 08/12/2019, 03/05/2020   PNEUMOCOCCAL CONJUGATE-20 09/09/2021  Pneumococcal Conjugate,unspecified 08/14/2020   Pneumococcal Conjugate-13 08/14/2020   Tdap 07/13/2013, 09/28/2023   Zoster Recombinant(Shingrix ) 02/13/2019, 05/16/2019     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   SBO (small bowel obstruction)  (HCC) 10/13/2021   Sternoclavicular joint strain 03/07/2014   UTI (urinary tract infection)    Patient denies this dx   Allergies  Allergen Reactions   Lisinopril Other (See Comments)    Decreased renal fx while on- resolved when dc'd   Sulfa Antibiotics Other (See Comments)    Unknown childhood reaction   Past Surgical History:  Procedure Laterality Date   ABDOMINAL HYSTERECTOMY  09/2011   APPENDECTOMY  2009   w/ hysterectomy   Bowel obstruction repair     10/2021   COLONOSCOPY     DEBULKING  09/2011   DILATION AND CURETTAGE OF UTERUS  1996   HAMMER TOE SURGERY Right 2017   HERNIA REPAIR  02/08/20   INGUINAL HERNIA REPAIR Right 02/08/2020   Procedure: REPAIR RIGHT INGUINAL HERNIA WITH MESH;  Surgeon: Vanderbilt Ned, MD;  Location: [facility];  Service: General;  Laterality: Right;  GENERAL AND TAP BLOCK   KNEE ARTHROSCOPY Right 2009   Knee   LAPAROSCOPIC LYSIS OF ADHESIONS N/A 10/17/2021   Procedure: LAPAROSCOPIC LYSIS OF ADHESIONS;  Surgeon: Tanda Locus, MD;  Location: [facility];  Service: General;  Laterality: N/A;  30 minutes    LAPAROSCOPY N/A 10/17/2021   Procedure: LAPAROSCOPY DIAGNOSTIC;  Surgeon: Tanda Locus, MD;  Location: [facility];  Service: General;  Laterality: N/A;   TONSILLECTOMY  1991   TOTAL ABDOMINAL HYSTERECTOMY W/ BILATERAL SALPINGOOPHORECTOMY  09/2011   TAHBSO with appendectomy and optimal tumor debulking in [place]   WISDOM TOOTH EXTRACTION  1985   Family History  Problem Relation Age of Onset   Hypertension Mother    Hypertrophic cardiomyopathy Mother 62       pt negativ rofr gene 08/25/12   Early death Mother    Alzheimer's disease Father 9   Cancer Brother        Squamous cell of sinus, hx smok inhalation Public house manager)   Early death Brother 40   Heart attack Brother    Alzheimer's disease Brother 26   Alzheimer's disease Paternal Grandmother    Heart attack Paternal Grandfather 76   Miscarriages / Stillbirths Daughter    Hypertrophic cardiomyopathy Son     Early death Son    Early death Son    Lymphoma Paternal Uncle        late 50's/early 25's   Colon cancer Neg Hx    Breast cancer Neg Hx    Ovarian cancer Neg Hx    Endometrial cancer Neg Hx    Pancreatic cancer Neg Hx    Prostate cancer Neg Hx    BRCA 1/2 Neg Hx    Social History   Social History Narrative   Marital status/children/pets: Married, 3 children. 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 If you have any questions, ask your nurse or doctor.          amLODipine  5 MG tablet Commonly known as: NORVASC  Take 1 tablet (5 mg total) by mouth daily. What changed:  medication strength how much to take Changed by: Charlies Bellini   ascorbic acid 500 MG tablet Commonly known as: VITAMIN C Take 500 mg by mouth 2 (two) times daily.   atenolol  25 MG tablet Commonly known as: TENORMIN  Take by mouth.   CALCIUM 600 + D PO Take 1 tablet by mouth in the morning and at bedtime.   multivitamin with minerals tablet Take 1 tablet by mouth at bedtime.        All past medical history, surgical history, allergies, family history, immunizations andmedications were updated in the EMR today and reviewed under the history and medication portions of their EMR.     No results found for this or any previous visit (from the past 2160 hours).   Review of Systems  All other systems reviewed and are negative.  14 pt review of systems performed and negative (unless mentioned in an HPI)  Objective: BP 135/78   Pulse 78    Temp 98.1 F (36.7 C) (Oral)   Wt 156 lb 9.6 oz (71 kg)   LMP  (LMP Unknown)   SpO2 96%   BMI 25.28 kg/m  Physical Exam Vitals and nursing note reviewed.  Constitutional:      General: She is not in acute distress.    Appearance: Normal appearance. She is not ill-appearing, toxic-appearing or diaphoretic.  HENT:     Head: Normocephalic and atraumatic.  Eyes:     General: No scleral icterus.       Right eye: No discharge.        Left eye: No discharge.     Extraocular Movements: Extraocular movements intact.     Conjunctiva/sclera: Conjunctivae normal.     Pupils: Pupils are equal, round, and reactive to light.  Cardiovascular:     Rate and Rhythm: Normal rate and regular rhythm.     Heart sounds: No murmur heard. Pulmonary:     Effort: Pulmonary effort is normal. No respiratory distress.     Breath sounds: Normal breath sounds. No wheezing, rhonchi or rales.  Musculoskeletal:     Cervical back: Neck supple.     Right lower leg: No edema.     Left lower leg: No edema.  Skin:    General: Skin is warm.     Findings: No rash.  Neurological:     Mental Status: She is alert and oriented to person, place, and time. Mental status is at baseline.     Motor: No weakness.     Gait: Gait normal.  Psychiatric:        Mood and Affect: Mood normal.        Behavior: Behavior normal.        Thought Content: Thought content normal.  Judgment: Judgment normal.    No results found.  Assessment/plan: Leah Perkins is a 68 y.o. female present for Chronic Conditions/illness Management                                                                                                                                                                                                                                                                                                                                                      Essential hypertension/HLD/family history of  cardiomyopathy Stable Increase to amlodipine  5 mg daily Labs A1c: CMP, TSH, lipids collected today  Elevated A1c: A1c collected today  Cognitive changes: Patient does have a significant family history of cognitive changes from Alzheimer's dementia over 3 generations on her paternal side, all of which started showing signs in her 92s. We discussed lab testing, MMSE, MRI, vitamin D , B12.  Thyroid  was being checked today.  Follow-up Agreed to bring her back in about 2-3 weeks, assuming thyroid  is normal today, and further evaluate with the above testing.  Influenza vaccine: Given today  Return in about 24 weeks (around 10/03/2024).   Orders Placed This Encounter  Procedures   Flu vaccine HIGH DOSE PF(Fluzone Trivalent)   CBC   Comp Met (CMET)   TSH   Lipid panel   Hemoglobin A1c   Meds ordered this encounter  Medications   amLODipine  (NORVASC ) 5 MG tablet    Sig: Take 1 tablet (5 mg total) by mouth daily.    Dispense:  90 tablet    Refill:  1   Referral Orders  No referral(s) requested today     Electronically signed by: Charlies Bellini, DO [facility]

## 2024-04-19 ENCOUNTER — Ambulatory Visit: Payer: Self-pay | Admitting: Family Medicine

## 2024-04-19 LAB — CBC
HCT: 36.9 % (ref 35.0–45.0)
Hemoglobin: 12 g/dL (ref 11.7–15.5)
MCH: 29.3 pg (ref 27.0–33.0)
MCHC: 32.5 g/dL (ref 32.0–36.0)
MCV: 90 fL (ref 80.0–100.0)
MPV: 9.9 fL (ref 7.5–12.5)
Platelets: 262 Thousand/uL (ref 140–400)
RBC: 4.1 Million/uL (ref 3.80–5.10)
RDW: 13.4 % (ref 11.0–15.0)
WBC: 4.9 Thousand/uL (ref 3.8–10.8)

## 2024-04-19 LAB — LIPID PANEL
Cholesterol: 201 mg/dL — ABNORMAL HIGH (ref <200)
HDL: 81 mg/dL (ref >=50)
LDL Cholesterol (Calc): 106 mg/dL — ABNORMAL HIGH
Non-HDL Cholesterol (Calc): 120 mg/dL (ref <130)
Total CHOL/HDL Ratio: 2.5 (calc) (ref <5.0)
Triglycerides: 57 mg/dL (ref <150)

## 2024-04-19 LAB — COMPREHENSIVE METABOLIC PANEL WITH GFR
AG Ratio: 1.8 (calc) (ref 1.0–2.5)
ALT: 15 U/L (ref 6–29)
AST: 22 U/L (ref 10–35)
Albumin: 4.2 g/dL (ref 3.6–5.1)
Alkaline phosphatase (APISO): 55 U/L (ref 37–153)
BUN: 17 mg/dL (ref 7–25)
CO2: 28 mmol/L (ref 20–32)
Calcium: 9.9 mg/dL (ref 8.6–10.4)
Chloride: 104 mmol/L (ref 98–110)
Creat: 0.91 mg/dL (ref 0.50–1.05)
Globulin: 2.3 g/dL (ref 1.9–3.7)
Glucose, Bld: 87 mg/dL (ref 65–99)
Potassium: 4 mmol/L (ref 3.5–5.3)
Sodium: 140 mmol/L (ref 135–146)
Total Bilirubin: 0.8 mg/dL (ref 0.2–1.2)
Total Protein: 6.5 g/dL (ref 6.1–8.1)
eGFR: 69 mL/min/1.73m2 (ref >=60)

## 2024-04-19 LAB — HEMOGLOBIN A1C
Hgb A1c MFr Bld: 5.5 % (ref <5.7)
Mean Plasma Glucose: 111 mg/dL
eAG (mmol/L): 6.2 mmol/L

## 2024-04-19 LAB — TSH: TSH: 1.51 m[IU]/L (ref 0.40–4.50)

## 2024-05-10 ENCOUNTER — Ambulatory Visit (INDEPENDENT_AMBULATORY_CARE_PROVIDER_SITE_OTHER): Admitting: Family Medicine

## 2024-05-10 ENCOUNTER — Encounter: Payer: Self-pay | Admitting: Family Medicine

## 2024-05-10 VITALS — BP 124/79 | HR 95 | Temp 98.2°F | Wt 157.4 lb

## 2024-05-10 DIAGNOSIS — Z0000 Encounter for general adult medical examination without abnormal findings: Secondary | ICD-10-CM | POA: Diagnosis not present

## 2024-05-10 DIAGNOSIS — R4189 Other symptoms and signs involving cognitive functions and awareness: Secondary | ICD-10-CM | POA: Diagnosis not present

## 2024-05-10 DIAGNOSIS — Z820 Family history of epilepsy and other diseases of the nervous system: Secondary | ICD-10-CM

## 2024-05-10 NOTE — Progress Notes (Signed)
 Leah Perkins , 05/13/1956, 68 y.o., female MRN: [id number] Patient Care Team    Relationship Specialty Notifications Start End  Catherine Charlies LABOR, DO PCP - General Family Medicine  02/13/19   Dodie Shadow, MD  Gynecologic Oncology  12/26/11   Jerrol Harvey, MD Attending Physician Obstetrics and Gynecology  02/13/19   Claudene Arthea HERO, DO Consulting Physician Sports Medicine  02/13/19   Vanderbilt Ned, MD Consulting Physician General Surgery  10/13/21   Rogelio Planas, MD Consulting Physician Obstetrics and Gynecology  10/13/21   Waylan Cain, MD Consulting Physician Ophthalmology  04/18/24     Chief Complaint  Patient presents with   Memory changes     Subjective: Leah Perkins is a 68 y.o. Pt presents for an OV with complaints of memory decline over the last year duration.   Patient's husband presented with her physical a few weeks ago, and has concerns over her  memory.  He has noticed within the last year she is becoming more forgetful.  Forgetting conversations, asking the same question.  She reports she agrees she has noticed she is becoming more forgetful, but sometimes she does admit she is just not listening all that well.  She has a family history of Alzheimer disease in her paternal grandmother, father and brother all which started having symptoms in their 60s-she reports they all smoked a good bit, and the nails in the family also consumed alcohol daily.       05/10/2024    1:19 PM  MMSE - Mini Mental State Exam  Orientation to time 5  Orientation to Place 5  Registration 3  Attention/ Calculation 5  Recall 3  Language- name 2 objects 2  Language- repeat 1  Language- follow 3 step command 3  Language- read & follow direction 1  Write a sentence 1  Copy design 1  Total score 30       04/18/2024    2:45 PM 04/18/2024    2:32 PM 04/05/2023    7:49 AM 09/17/2022    9:37 AM 09/09/2022    9:39 AM  Depression screen PHQ 2/9  Decreased Interest 0 0 0 0 0  Down,  Depressed, Hopeless 0 0 0 0 0  PHQ - 2 Score 0 0 0 0 0  Altered sleeping 1      Tired, decreased energy 1      Change in appetite 0      Feeling bad or failure about yourself  0      Trouble concentrating 0      Moving slowly or fidgety/restless 0      Suicidal thoughts 0      PHQ-9 Score 2      Difficult doing work/chores Not difficult at all        Allergies  Allergen Reactions   Lisinopril Other (See Comments)    Decreased renal fx while on- resolved when dc'd   Sulfa Antibiotics Other (See Comments)    Unknown childhood reaction   Social History   Social History Narrative   Marital status/children/pets: Married, 3 children. 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,  Followed by Dr Gehrig/Dr Leonce- [place]   Patellar contusion, right, initial encounter 03/21/2018   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   SBO (small bowel obstruction) (HCC) 10/13/2021   Sternoclavicular joint strain 03/07/2014   UTI (urinary tract infection)    Patient denies this dx   Past Surgical History:  Procedure Laterality Date   ABDOMINAL HYSTERECTOMY  09/2011   APPENDECTOMY  2009   w/ hysterectomy   Bowel obstruction repair     10/2021   COLONOSCOPY     DEBULKING  09/2011   DILATION AND CURETTAGE OF UTERUS  1996   HAMMER TOE SURGERY Right 2017   HERNIA REPAIR  02/08/20   INGUINAL HERNIA REPAIR Right 02/08/2020   Procedure: REPAIR RIGHT INGUINAL HERNIA WITH MESH;  Surgeon: Vanderbilt Ned, MD;   Location: [facility];  Service: General;  Laterality: Right;  GENERAL AND TAP BLOCK   KNEE ARTHROSCOPY Right 2009   Knee   LAPAROSCOPIC LYSIS OF ADHESIONS N/A 10/17/2021   Procedure: LAPAROSCOPIC LYSIS OF ADHESIONS;  Surgeon: Tanda Locus, MD;  Location: [facility];  Service: General;  Laterality: N/A;  30 minutes    LAPAROSCOPY N/A 10/17/2021   Procedure: LAPAROSCOPY DIAGNOSTIC;  Surgeon: Tanda Locus, MD;  Location: [facility];  Service: General;  Laterality: N/A;   TONSILLECTOMY  1991   TOTAL ABDOMINAL HYSTERECTOMY W/ BILATERAL SALPINGOOPHORECTOMY  09/2011   TAHBSO with appendectomy and optimal tumor debulking in [place]   WISDOM TOOTH EXTRACTION  1985   Family History  Problem Relation Age of Onset   Hypertension Mother    Hypertrophic cardiomyopathy Mother 52       pt negativ rofr gene September 01, 2012   Early death Mother    Alzheimer's disease Father 42   Cancer Brother        Squamous cell of sinus, hx smok inhalation public house manager)   Early death Brother 40   Heart attack Brother    Alzheimer's disease Brother 39   Alzheimer's disease Paternal Grandmother    Heart attack Paternal Grandfather 6   Miscarriages / Stillbirths Daughter    Hypertrophic cardiomyopathy Son    Early death Son    Early death Son    Lymphoma Paternal Uncle        late 50's/early 5's   Colon cancer Neg Hx    Breast cancer Neg Hx    Ovarian cancer Neg Hx    Endometrial cancer Neg Hx    Pancreatic cancer Neg Hx    Prostate cancer Neg Hx    BRCA 1/2 Neg Hx    Allergies as of 05/10/2024       Reactions   Lisinopril Other (See Comments)   Decreased renal fx while on- resolved when dc'd   Sulfa Antibiotics Other (See Comments)   Unknown childhood reaction        Medication List        Accurate as of May 10, 2024  1:58 PM. If you have any questions, ask your nurse or doctor.          amLODipine  5 MG tablet Commonly known as: NORVASC  Take 1 tablet (5 mg total) by mouth daily.   ascorbic acid 500 MG  tablet Commonly known as:  VITAMIN C Take 500 mg by mouth 2 (two) times daily.   atenolol  25 MG tablet Commonly known as: TENORMIN  Take by mouth.   CALCIUM 600 + D PO Take 1 tablet by mouth in the morning and at bedtime.   multivitamin with minerals tablet Take 1 tablet by mouth at bedtime.        All past medical history, surgical history, allergies, family history, immunizations andmedications were updated in the EMR today and reviewed under the history and medication portions of their EMR.     ROS Negative, with the exception of above mentioned in HPI   Objective:  BP 124/79   Pulse 95   Temp 98.2 F (36.8 C)   Wt 157 lb 6.4 oz (71.4 kg)   LMP  (LMP Unknown)   SpO2 98%   BMI 25.41 kg/m  Body mass index is 25.41 kg/m. Physical Exam Vitals and nursing note reviewed.  Constitutional:      General: She is not in acute distress.    Appearance: Normal appearance. She is normal weight. She is not ill-appearing or toxic-appearing.  HENT:     Head: Normocephalic and atraumatic.  Eyes:     General: No scleral icterus.       Right eye: No discharge.        Left eye: No discharge.     Extraocular Movements: Extraocular movements intact.     Conjunctiva/sclera: Conjunctivae normal.     Pupils: Pupils are equal, round, and reactive to light.  Skin:    Findings: No rash.  Neurological:     Mental Status: She is alert and oriented to person, place, and time. Mental status is at baseline.     Motor: No weakness.     Coordination: Coordination normal.     Gait: Gait normal.  Psychiatric:        Mood and Affect: Mood normal.        Behavior: Behavior normal.        Thought Content: Thought content normal.        Judgment: Judgment normal.    No results found. No results found. No results found for this or any previous visit (from the past 24 hours).  Assessment/Plan: Leah Perkins is a 68 y.o. female present for OV for  Cognitive changes: MMSE today is 30/30.   Overall memory is intact. We discussed results in detail today. Will move forward with vitamin D , B12 labs today.  Thyroid  levels were normal few weeks ago. She would like to have the genetic testing for Alzheimer's completed. We will also move forward with MRI to rule out other causes of memory changes. She reports she would like to refrain from prescribed medications from her at this time.  Reviewed expectations re: course of current medical issues. Discussed self-management of symptoms. Outlined signs and symptoms indicating need for more acute intervention. Patient verbalized understanding and all questions were answered. Patient received an After-Visit Summary.    Orders Placed This Encounter  Procedures   MR BRAIN W WO CONTRAST   B12 and Folate Panel   Vitamin D  (25 hydroxy)   AD-DETECT ABETA 42/40 AND [id number] EVALUATION, PLASMA   No orders of the defined types were placed in this encounter.  Referral Orders  No referral(s) requested today     Note is dictated utilizing voice recognition software. Although note has been proof read prior to signing, occasional typographical errors still can be missed. If any questions arise, please do not hesitate  to call for verification.   electronically signed by:  Charlies Bellini, DO  [facility] - OR

## 2024-05-10 NOTE — Patient Instructions (Addendum)
 Return if symptoms worsen or fail to improve, for For Medicare wellness with health coach.        Great to see you today.  I have refilled the medication(s) we provide.   If labs were collected or images ordered, we will inform you of  results once we have received them and reviewed. We will contact you either by echart message, or telephone call.  Please give ample time to the testing facility, and our office to run,  receive and review results. Please do not call inquiring of results, even if you can see them in your chart. We will contact you as soon as we are able. If it has been over 1 week since the test was completed, and you have not yet heard from us , then please call us .    - echart message- for normal results that have been seen by the patient already.   - telephone call: abnormal results or if patient has not viewed results in their echart.  If a referral to a specialist was entered for you, please call us  in 2 weeks if you have not heard from the specialist office to schedule.

## 2024-05-10 NOTE — Progress Notes (Deleted)
 Subjective:   Leah Perkins is a 68 y.o. female who presents for Medicare Annual (Subsequent) preventive examination.  Visit Complete: {VISITMETHODVS:[phone]}  Patient Medicare AWV questionnaire was completed by the patient on ***; I have confirmed that all information answered by patient is correct and no changes since this date.        Objective:    There were no vitals filed for this visit. There is no height or weight on file to calculate BMI.     09/09/2022    9:41 AM 11/27/2021    1:45 PM 10/17/2021    8:10 AM 10/13/2021    1:53 PM 08/20/2021   11:43 AM 11/12/2020    3:32 PM 02/08/2020   11:56 AM  Advanced Directives  Does Patient Have a Medical Advance Directive? Yes Yes  Yes Yes Yes Yes  Type of Estate Agent of [place];Living will Healthcare Power of [place];Living will Healthcare Power of [place];Living will Healthcare Power of [place];Living will Healthcare Power of [place] of [place];Living will Healthcare Power of [place];Living will  Does patient want to make changes to medical advance directive?  No - Patient declined  No - Patient declined   No - Patient declined  Copy of Healthcare Power of Attorney in Chart? No - copy requested   No - copy requested No - copy requested No - copy requested No - copy requested  Would patient like information on creating a medical advance directive?   No - Patient declined No - Patient declined       Current Medications (verified) Outpatient Encounter Medications as of 05/10/2024  Medication Sig   amLODipine  (NORVASC ) 5 MG tablet Take 1 tablet (5 mg total) by mouth daily.   atenolol  (TENORMIN ) 25 MG tablet Take by mouth.   Calcium Carb-Cholecalciferol (CALCIUM 600 + D PO) Take 1 tablet by mouth in the morning and at bedtime.    Multiple Vitamins-Minerals (MULTIVITAMIN WITH MINERALS) tablet Take 1 tablet by mouth at bedtime.   vitamin C (ASCORBIC ACID) 500 MG tablet Take 500 mg by mouth 2  (two) times daily.   No facility-administered encounter medications on file as of 05/10/2024.    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   SBO (small bowel obstruction) (HCC) 10/13/2021   Sternoclavicular joint strain 03/07/2014   UTI (urinary tract infection)    Patient denies this dx   Past Surgical History:  Procedure Laterality Date   ABDOMINAL HYSTERECTOMY  09/2011   APPENDECTOMY  2009   w/ hysterectomy   Bowel obstruction repair     10/2021   COLONOSCOPY     DEBULKING  09/2011   DILATION AND CURETTAGE OF UTERUS  1996   HAMMER TOE SURGERY Right 2017   HERNIA REPAIR  02/08/20   INGUINAL HERNIA REPAIR Right 02/08/2020   Procedure: REPAIR RIGHT INGUINAL HERNIA WITH MESH;  Surgeon: Vanderbilt Ned, MD;  Location: [facility] OR;  Service: General;  Laterality: Right;  GENERAL AND TAP BLOCK  KNEE ARTHROSCOPY Right 2009   Knee   LAPAROSCOPIC LYSIS OF ADHESIONS N/A 10/17/2021   Procedure: LAPAROSCOPIC LYSIS OF ADHESIONS;  Surgeon: Tanda Locus, MD;  Location: [facility];  Service: General;  Laterality: N/A;  30 minutes    LAPAROSCOPY N/A 10/17/2021   Procedure: LAPAROSCOPY DIAGNOSTIC;  Surgeon: Tanda Locus, MD;  Location: [facility];  Service: General;  Laterality: N/A;   TONSILLECTOMY  1991   TOTAL ABDOMINAL HYSTERECTOMY W/ BILATERAL SALPINGOOPHORECTOMY  09/2011   TAHBSO with appendectomy and optimal tumor debulking in [place]   WISDOM TOOTH EXTRACTION  1985   Family History  Problem Relation Age of Onset    Hypertension Mother    Hypertrophic cardiomyopathy Mother 52       pt negativ rofr gene 08/14/2012   Early death Mother    Alzheimer's disease Father 33   Cancer Brother        Squamous cell of sinus, hx smok inhalation public house manager)   Early death Brother 40   Heart attack Brother    Alzheimer's disease Brother 79   Alzheimer's disease Paternal Grandmother    Heart attack Paternal Grandfather 10   Miscarriages / Stillbirths Daughter    Hypertrophic cardiomyopathy Son    Early death Son    Early death Son    Lymphoma Paternal Uncle        late 50's/early 74's   Colon cancer Neg Hx    Breast cancer Neg Hx    Ovarian cancer Neg Hx    Endometrial cancer Neg Hx    Pancreatic cancer Neg Hx    Prostate cancer Neg Hx    BRCA 1/2 Neg Hx    Social History   Socioeconomic History   Marital status: Married    Spouse name: Not on file   Number of children: Not on file   Years of education: Not on file   Highest education level: Associate degree: academic program  Occupational History   Not on file  Tobacco Use   Smoking status: Never   Smokeless tobacco: Never  Vaping Use   Vaping status: Never Used  Substance and Sexual Activity   Alcohol use: Not Currently    Comment: Socially ~1 / week   Drug use: No   Sexual activity: Yes    Partners: Male    Birth control/protection: Surgical    Comment: Hysterectomy  Other Topics Concern   Not on file  Social History Narrative   Marital status/children/pets: Married, 3 children. Has grandchildren   Education/employment: A.A.S., retired   Field Seismologist:      -Wears a bicycle helmet riding a bike: Yes     -smoke alarm in the home:Yes     - wears seatbelt: Yes     - Feels safe in their relationships: Yes   Social Drivers of Corporate Investment Banker Strain: Low Risk  (05/08/2024)   Overall Financial Resource Strain (CARDIA)    Difficulty of Paying Living Expenses: Not hard at all  Food Insecurity: No Food Insecurity (05/08/2024)    Hunger Vital Sign    Worried About Running Out of Food in the Last Year: Never true    Ran Out of Food in the Last Year: Never true  Transportation Needs: No Transportation Needs (05/08/2024)   PRAPARE - Administrator, Civil Service (Medical): No    Lack of Transportation (Non-Medical): No  Physical Activity: Sufficiently Active (05/08/2024)   Exercise Vital Sign    Days of Exercise per Week: 6 days  Minutes of Exercise per Session: 80 min  Stress: No Stress Concern Present (05/08/2024)   [employer] of Occupational Health - Occupational Stress Questionnaire    Feeling of Stress: Not at all  Social Connections: Moderately Isolated (05/08/2024)   Social Connection and Isolation Panel    Frequency of Communication with Friends and Family: Twice a week    Frequency of Social Gatherings with Friends and Family: Twice a week    Attends Religious Services: Patient declined    Database Administrator or Organizations: No    Attends Engineer, Structural: Not on file    Marital Status: Married    Tobacco Counseling Counseling given: Not Answered   Clinical Intake:                        Activities of Daily Living    09/08/2023    9:12 AM  In your present state of health, do you have any difficulty performing the following activities:  Hearing? 0  Vision? 0  Difficulty concentrating or making decisions? 0  Walking or climbing stairs? 0  Dressing or bathing? 0  Doing errands, shopping? 0  Preparing Food and eating ? N  Using the Toilet? N  In the past six months, have you accidently leaked urine? N  Do you have problems with loss of bowel control? N  Managing your Medications? N  Managing your Finances? N  Housekeeping or managing your Housekeeping? N    Patient Care Team: Catherine Charlies LABOR, DO as PCP - General (Family Medicine) Dodie Shadow, MD (Gynecologic Oncology) Jerrol Harvey, MD as Attending Physician (Obstetrics and  Gynecology) Claudene Arthea HERO, DO as Consulting Physician (Sports Medicine) Vanderbilt Ned, MD as Consulting Physician (General Surgery) Rogelio Planas, MD as Consulting Physician (Obstetrics and Gynecology) Waylan Cain, MD as Consulting Physician (Ophthalmology)  Indicate any recent Medical Services you may have received from other than [facility] providers in the past year (date may be approximate).     Assessment:   This is a routine wellness examination for [place].  Hearing/Vision screen No results found.   Goals Addressed   None    Depression Screen    04/18/2024    2:45 PM 04/18/2024    2:32 PM 04/05/2023    7:49 AM 09/17/2022    9:37 AM 09/09/2022    9:39 AM 08/20/2021   11:42 AM 03/11/2021    9:00 AM  PHQ 2/9 Scores  PHQ - 2 Score 0 0 0 0 0 0 0  PHQ- 9 Score 2          Fall Risk    04/18/2024    2:45 PM 04/18/2024    2:32 PM 09/16/2023    9:07 AM 09/08/2023    9:12 AM 04/05/2023    7:49 AM  Fall Risk   Falls in the past year? 0 0 0 0 0  Number falls in past yr: 0 0  0 0  Injury with Fall? 0 0  0 0  Risk for fall due to : No Fall Risks No Fall Risks   No Fall Risks  Follow up Falls evaluation completed Falls evaluation completed   Falls evaluation completed    MEDICARE RISK AT HOME:    TIMED UP AND GO:  Was the test performed?  {AMBTIMEDUPGO:[phone]}    Cognitive Function:        09/09/2022    9:43 AM 08/20/2021   11:45 AM  6CIT Screen  What  Year? 0 points 0 points  What month? 0 points 0 points  What time? 0 points 0 points  Count back from 20 0 points 0 points  Months in reverse 0 points 0 points  Repeat phrase 0 points 0 points  Total Score 0 points 0 points    Immunizations Immunization History  Administered Date(s) Administered   Fluad Quad(high Dose 65+) 08/14/2020, 03/11/2021   Fluad Trivalent(High Dose 65+) 04/05/2023   Fluzone Influenza virus vaccine,trivalent (IIV3), split virus 04/26/2017   INFLUENZA, HIGH DOSE SEASONAL PF 04/18/2024    Influenza,inj,Quad PF,6+ Mos 05/02/2019   Influenza-Unspecified 04/27/2018, 08/14/2020   PFIZER(Purple Top)SARS-COV-2 Vaccination 07/24/2019, 08/12/2019, 03/05/2020   PNEUMOCOCCAL CONJUGATE-20 09/09/2021   Pneumococcal Conjugate,unspecified 08/14/2020   Pneumococcal Conjugate-13 08/14/2020   Tdap 07/13/2013, 09/28/2023   Zoster Recombinant(Shingrix ) 02/13/2019, 05/16/2019    {TDAP status:2101805}  {Flu Vaccine status:2101806}  {Pneumococcal vaccine status:2101807}  {Covid-19 vaccine status:2101808}  Qualifies for Shingles Vaccine? {YES/NO:21197}  Zostavax completed {YES/NO:21197}  {Shingrix  Completed?:2101804}  Screening Tests Health Maintenance  Topic Date Due   Mammogram  11/11/2024   Medicare Annual Wellness (AWV)  05/10/2025   Colonoscopy  12/15/2027   DTaP/Tdap/Td (3 - Td or Tdap) 09/27/2033   Pneumococcal Vaccine: 50+ Years  Completed   Influenza Vaccine  Completed   DEXA SCAN  Completed   Zoster Vaccines- Shingrix   Completed   Meningococcal B Vaccine  Aged Out   COVID-19 Vaccine  Discontinued   Hepatitis C Screening  Discontinued    Health Maintenance  There are no preventive care reminders to display for this patient.  {Colorectal cancer screening:2101809}  {Mammogram status:21018020}  {Bone Density status:21018021}  Lung Cancer Screening: (Low Dose CT Chest recommended if Age 46-80 years, 20 pack-year currently smoking OR have quit w/in 15years.) {DOES NOT does:27190::does not} qualify.   Lung Cancer Screening Referral: ***  Additional Screening:  Hepatitis C Screening: {DOES NOT does:27190::does not} qualify; Completed ***  Vision Screening: Recommended annual ophthalmology exams for early detection of glaucoma and other disorders of the eye. Is the patient up to date with their annual eye exam?  {YES/NO:21197} Who is the provider or what is the name of the office in which the patient attends annual eye exams? *** If pt is not established  with a provider, would they like to be referred to a provider to establish care? {YES/NO:21197}.   Dental Screening: Recommended annual dental exams for proper oral hygiene  Diabetic Foot Exam: {Diabetic Foot Exam:2101802}  Community Resource Referral / Chronic Care Management: CRR required this visit?  {YES/NO:21197}  CCM required this visit?  {CCM Required choices:[phone]}     Plan:     I have personally reviewed and noted the following in the patient's chart:   Medical and social history Use of alcohol, tobacco or illicit drugs  Current medications and supplements including opioid prescriptions. {Opioid Prescriptions:[phone]} Functional ability and status Nutritional status Physical activity Advanced directives List of other physicians Hospitalizations, surgeries, and ER visits in previous 12 months Vitals Screenings to include cognitive, depression, and falls Referrals and appointments  In addition, I have reviewed and discussed with patient certain preventive protocols, quality metrics, and best practice recommendations. A written personalized care plan for preventive services as well as general preventive health recommendations were provided to patient.     Shanda LELON Sharps, CMA   05/10/2024   After Visit Summary: {CHL AMB AWV After Visit Summary:[phone]}  Nurse Notes: ***

## 2024-05-11 ENCOUNTER — Ambulatory Visit: Payer: Self-pay | Admitting: Family Medicine

## 2024-05-12 ENCOUNTER — Encounter: Payer: Self-pay | Admitting: Family Medicine

## 2024-05-15 LAB — B12 AND FOLATE PANEL
Folate: >24 ng/mL
Vitamin B-12: 785 pg/mL (ref 200–1100)

## 2024-05-15 LAB — AD-DETECT ABETA 42/40 AND P-TAU217 EVALUATION, PLASMA
ABETA 40: 244 pg/mL
ABETA 42/40 RATIO: 0.152 — ABNORMAL LOW (ref >=0.170)
ABETA 42: 37 pg/mL
AD DETECT PTAU217: 0.28 pg/mL — ABNORMAL HIGH (ref <=0.15)
LIKELIHOOD SCORE: 0.507 — ABNORMAL HIGH

## 2024-05-15 LAB — VITAMIN D 25 HYDROXY (VIT D DEFICIENCY, FRACTURES): Vit D, 25-Hydroxy: 70 ng/mL (ref 30–100)

## 2024-05-17 NOTE — Telephone Encounter (Signed)
 Patient was called today and Altheimer's labs discussed in detail.  Indeterminate result P tau 217 mildly elevated with normal AD detect ratio. MRI scheduled 11/13

## 2024-05-25 ENCOUNTER — Ambulatory Visit
Admission: RE | Admit: 2024-05-25 | Discharge: 2024-05-25 | Disposition: A | Discharge status: Home / Self Care | Source: Ambulatory Visit | Attending: Family Medicine | Admitting: Family Medicine

## 2024-05-25 DIAGNOSIS — Z820 Family history of epilepsy and other diseases of the nervous system: Secondary | ICD-10-CM

## 2024-05-25 DIAGNOSIS — R4189 Other symptoms and signs involving cognitive functions and awareness: Secondary | ICD-10-CM

## 2024-05-25 MED ORDER — GADOPICLENOL 0.5 MMOL/ML IV SOLN
7.0000 mL | Freq: Once | INTRAVENOUS | Status: AC | PRN
  Administered 2024-05-25: 7 mL via INTRAVENOUS

## 2024-07-11 ENCOUNTER — Other Ambulatory Visit: Payer: Self-pay

## 2024-07-11 ENCOUNTER — Encounter: Payer: Self-pay | Admitting: Family Medicine

## 2024-07-11 ENCOUNTER — Ambulatory Visit: Admitting: Family Medicine

## 2024-07-11 VITALS — BP 148/108 | HR 74 | Ht 66.0 in | Wt 155.0 lb

## 2024-07-11 DIAGNOSIS — M25532 Pain in left wrist: Secondary | ICD-10-CM | POA: Diagnosis not present

## 2024-07-11 DIAGNOSIS — M67432 Ganglion, left wrist: Secondary | ICD-10-CM

## 2024-07-11 NOTE — Patient Instructions (Signed)
 Drained L wrist

## 2024-07-11 NOTE — Progress Notes (Signed)
 " [name] [address] [place] [address] Phone: [phone] Subjective:   Leah Perkins, am serving as a scribe for Dr. Arthea Claudene.  I'm seeing this patient by the request  of:  Kuneff, Renee A, DO  CC: Left wrist  YEP:Dlagzrupcz  Leah Perkins is a 68 y.o. female coming in with complaint of L wrist. Patient  Has had swelling of the left wrist previously.  Has had a ganglion cyst previously drained and did do well but now having increasing discomfort.  Affecting daily activities.     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   SBO (small bowel obstruction) (HCC) 10/13/2021   Sternoclavicular joint strain 03/07/2014   UTI (urinary tract infection)    Patient denies this dx   Past Surgical History:  Procedure Laterality Date   ABDOMINAL HYSTERECTOMY  09/2011   APPENDECTOMY  2009   w/ hysterectomy   Bowel obstruction repair     10/2021   COLONOSCOPY     DEBULKING  09/2011   DILATION AND CURETTAGE OF UTERUS  1996   HAMMER TOE SURGERY Right 2017   HERNIA REPAIR  02/08/20   INGUINAL HERNIA REPAIR Right 02/08/2020   Procedure: REPAIR RIGHT INGUINAL HERNIA WITH MESH;  Surgeon: Vanderbilt Ned, MD;  Location: [facility];  Service: General;  Laterality: Right;  GENERAL AND TAP BLOCK   KNEE ARTHROSCOPY Right 2009   Knee   LAPAROSCOPIC LYSIS OF ADHESIONS N/A 10/17/2021   Procedure: LAPAROSCOPIC LYSIS OF ADHESIONS;  Surgeon: Tanda Locus, MD;  Location: [facility];  Service: General;  Laterality: N/A;  30 minutes    LAPAROSCOPY N/A  10/17/2021   Procedure: LAPAROSCOPY DIAGNOSTIC;  Surgeon: Tanda Locus, MD;  Location: [facility];  Service: General;  Laterality: N/A;   TONSILLECTOMY  1991   TOTAL ABDOMINAL HYSTERECTOMY W/ BILATERAL SALPINGOOPHORECTOMY  09/2011   TAHBSO with appendectomy and optimal tumor debulking in [place]   WISDOM TOOTH EXTRACTION  1985   Social History   Socioeconomic History   Marital status: Married    Spouse name: Not on file   Number of children: Not on file   Years of education: Not on file   Highest education level: Associate degree: academic program  Occupational History   Not on file  Tobacco Use   Smoking status: Never   Smokeless tobacco: Never  Vaping Use   Vaping status: Never Used  Substance and Sexual Activity   Alcohol use: Not Currently    Comment: Socially ~1 / week   Drug use: No   Sexual activity: Yes    Partners: Male    Birth control/protection: Surgical    Comment: Hysterectomy  Other Topics Concern   Not on file  Social History Narrative   Marital status/children/pets: Married, 3 children. Has grandchildren   Education/employment: [employer] retired   Field Seismologist:      -Wears a bicycle helmet riding a bike: Yes     -smoke alarm in the home:Yes     - wears seatbelt: Yes     -  Feels safe in their relationships: Yes   Social Drivers of Health   Tobacco Use: Low Risk (05/10/2024)   Patient History    Smoking Tobacco Use: Never    Smokeless Tobacco Use: Never    Passive Exposure: Not on file  Financial Resource Strain: Low Risk (05/08/2024)   Overall Financial Resource Strain (CARDIA)    Difficulty of Paying Living Expenses: Not hard at all  Food Insecurity: No Food Insecurity (05/08/2024)   Epic    Worried About Programme Researcher, Broadcasting/film/video in the Last Year: Never true    Ran Out of Food in the Last Year: Never true  Transportation Needs: No Transportation Needs (05/08/2024)   Epic    Lack of Transportation (Medical): No    Lack of Transportation (Non-Medical): No  Physical  Activity: Sufficiently Active (05/08/2024)   Exercise Vital Sign    Days of Exercise per Week: 6 days    Minutes of Exercise per Session: 80 min  Stress: No Stress Concern Present (05/08/2024)   [employer] of Occupational Health - Occupational Stress Questionnaire    Feeling of Stress: Not at all  Social Connections: Moderately Isolated (05/08/2024)   Social Connection and Isolation Panel    Frequency of Communication with Friends and Family: Twice a week    Frequency of Social Gatherings with Friends and Family: Twice a week    Attends Religious Services: Patient declined    Database Administrator or Organizations: No    Attends Engineer, Structural: Not on file    Marital Status: Married  Depression (PHQ2-9): Low Risk (04/18/2024)   Depression (PHQ2-9)    PHQ-2 Score: 2  Alcohol Screen: Low Risk (05/08/2024)   Alcohol Screen    Last Alcohol Screening Score (AUDIT): 1  Housing: Low Risk (05/08/2024)   Epic    Unable to Pay for Housing in the Last Year: No    Number of Times Moved in the Last Year: 0    Homeless in the Last Year: No  Utilities: Not At Risk (09/05/2022)   [facility] Utilities    Threatened with loss of utilities: No  Health Literacy: Not on file   Allergies[1] Family History  Problem Relation Age of Onset   Hypertension Mother    Hypertrophic cardiomyopathy Mother 45       pt negativ rofr gene 07-Aug-2012   Early death Mother    Alzheimer's disease Father 61   Cancer Brother        Squamous cell of sinus, hx smok inhalation public house manager)   Early death Brother 40   Heart attack Brother    Alzheimer's disease Brother 51   Alzheimer's disease Paternal Grandmother    Heart attack Paternal Grandfather 51   Miscarriages / Stillbirths Daughter    Hypertrophic cardiomyopathy Son    Early death Son    Early death Son    Lymphoma Paternal Uncle        late 50's/early 67's   Colon cancer Neg Hx    Breast cancer Neg Hx    Ovarian cancer Neg Hx     Endometrial cancer Neg Hx    Pancreatic cancer Neg Hx    Prostate cancer Neg Hx    BRCA 1/2 Neg Hx     Current Outpatient Medications (Cardiovascular):    amLODipine  (NORVASC ) 5 MG tablet, Take 1 tablet (5 mg total) by mouth daily.  Current Outpatient Medications (Other):    Calcium Carb-Cholecalciferol (CALCIUM 600 + D PO), Take 1 tablet by  mouth in the morning and at bedtime.    Multiple Vitamins-Minerals (MULTIVITAMIN WITH MINERALS) tablet, Take 1 tablet by mouth at bedtime.   vitamin C (ASCORBIC ACID) 500 MG tablet, Take 500 mg by mouth 2 (two) times daily.   Reviewed prior external information including notes and imaging from  primary care provider As well as notes that were available from care everywhere and other healthcare systems.  Past medical history, social, surgical and family history all reviewed in electronic medical record.  No pertanent information unless stated regarding to the chief complaint.   Review of Systems:  No headache, visual changes, nausea, vomiting, diarrhea, constipation, dizziness, abdominal pain, skin rash, fevers, chills, night sweats, weight loss, swollen lymph nodes, body aches, joint swelling, chest pain, shortness of breath, mood changes. POSITIVE muscle aches  Objective  Blood pressure (!) 148/108, pulse 74, height 5' 6 (1.676 m), weight 155 lb (70.3 kg), SpO2 98%.   General: No apparent distress alert and oriented x3 mood and affect normal, dressed appropriately.  HEENT: Pupils equal, extraocular movements intact  Respiratory: Patient's speak in full sentences and does not appear short of breath  Cardiovascular: No lower extremity edema, non tender, no erythema  Left wrist does show on the volar aspect of the wrist cyst consistent with a ganglion cyst.  Procedure: Real-time Ultrasound Guided Injection of left ganglion cyst of wrist Device: GE Logiq Q7 Ultrasound guided injection is preferred based studies that show increased duration,  increased effect, greater accuracy, decreased procedural pain, increased response rate, and decreased cost with ultrasound guided versus blind injection.  Verbal informed consent obtained.  Time-out conducted.  Noted no overlying erythema, induration, or other signs of local infection.  Skin prepped in a sterile fashion.  Local anesthesia: Topical Ethyl chloride.  With sterile technique and under real time ultrasound guidance: With an 18-gauge 1-1/2 inch needle injected with 0.5 cc 0.5% Marcaine  and aspirated 3 cc of gel-like fluid, injected 1 cc of Kenalog 40 mg/mL. Completed without difficulty  Pain immediately resolved suggesting accurate placement of the medication.  Advised to call if fevers/chills, erythema, induration, drainage, or persistent bleeding.  Impression: Technically successful ultrasound guided injection.    Impression and Recommendations:      The above documentation has been reviewed and is accurate and complete Arthea CHRISTELLA Sharps, DO      [1]  Allergies Allergen Reactions   Lisinopril Other (See Comments)    Decreased renal fx while on- resolved when dc'd   Sulfa Antibiotics Other (See Comments)    Unknown childhood reaction   "

## 2024-07-11 NOTE — Assessment & Plan Note (Signed)
 Repeat aspiration done today, tolerated the procedure well, discussed icing regimen and home exercises, discussed which activities to do and which ones to avoid.  Increase activity slowly.  Discussed icing regimen.  Follow-up again in 6 to 8 weeks otherwise.
